# Patient Record
Sex: Male | Born: 1945 | Race: Black or African American | Hispanic: No | Marital: Married | Smoking: Former smoker
Health system: Southern US, Community
[De-identification: ages and names within clinical notes are randomized; demographics above are authoritative.]

## PROBLEM LIST (undated history)

## (undated) ENCOUNTER — Emergency Department (HOSPITAL_COMMUNITY): Admission: EM | Payer: Medicare Other

## (undated) DIAGNOSIS — Z8739 Personal history of other diseases of the musculoskeletal system and connective tissue: Secondary | ICD-10-CM

## (undated) DIAGNOSIS — Z992 Dependence on renal dialysis: Secondary | ICD-10-CM

## (undated) DIAGNOSIS — I10 Essential (primary) hypertension: Secondary | ICD-10-CM

## (undated) DIAGNOSIS — K922 Gastrointestinal hemorrhage, unspecified: Secondary | ICD-10-CM

## (undated) DIAGNOSIS — E669 Obesity, unspecified: Secondary | ICD-10-CM

## (undated) DIAGNOSIS — R079 Chest pain, unspecified: Secondary | ICD-10-CM

## (undated) DIAGNOSIS — N186 End stage renal disease: Secondary | ICD-10-CM

## (undated) DIAGNOSIS — N19 Unspecified kidney failure: Secondary | ICD-10-CM

## (undated) DIAGNOSIS — E119 Type 2 diabetes mellitus without complications: Secondary | ICD-10-CM

## (undated) DIAGNOSIS — M1990 Unspecified osteoarthritis, unspecified site: Secondary | ICD-10-CM

## (undated) DIAGNOSIS — R0602 Shortness of breath: Secondary | ICD-10-CM

## (undated) HISTORY — PX: MENISECTOMY: SHX5181

## (undated) HISTORY — PX: AV FISTULA PLACEMENT: SHX1204

## (undated) HISTORY — PX: CATARACT EXTRACTION W/ INTRAOCULAR LENS  IMPLANT, BILATERAL: SHX1307

---
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
## 1999-09-21 ENCOUNTER — Encounter: Payer: Self-pay | Admitting: Family Medicine

## 1999-09-21 ENCOUNTER — Ambulatory Visit (HOSPITAL_COMMUNITY): Admission: RE | Admit: 1999-09-21 | Discharge: 1999-09-21 | Payer: Self-pay | Admitting: Family Medicine

## 2004-07-17 ENCOUNTER — Ambulatory Visit: Payer: Self-pay | Admitting: *Deleted

## 2004-12-24 ENCOUNTER — Ambulatory Visit: Payer: Self-pay | Admitting: Nurse Practitioner

## 2005-08-13 ENCOUNTER — Ambulatory Visit: Payer: Self-pay | Admitting: Nurse Practitioner

## 2005-08-16 ENCOUNTER — Ambulatory Visit: Payer: Self-pay | Admitting: Nurse Practitioner

## 2006-02-03 ENCOUNTER — Ambulatory Visit: Payer: Self-pay | Admitting: Pulmonary Disease

## 2006-02-03 ENCOUNTER — Encounter: Payer: Self-pay | Admitting: Internal Medicine

## 2006-02-03 ENCOUNTER — Inpatient Hospital Stay (HOSPITAL_COMMUNITY): Admission: AD | Admit: 2006-02-03 | Discharge: 2006-02-10 | Payer: Self-pay | Admitting: Internal Medicine

## 2006-02-07 ENCOUNTER — Encounter: Payer: Self-pay | Admitting: Vascular Surgery

## 2006-03-01 ENCOUNTER — Ambulatory Visit: Payer: Self-pay | Admitting: Nurse Practitioner

## 2006-08-24 ENCOUNTER — Ambulatory Visit: Payer: Self-pay | Admitting: Nurse Practitioner

## 2007-05-09 ENCOUNTER — Ambulatory Visit: Payer: Self-pay | Admitting: Internal Medicine

## 2007-07-12 ENCOUNTER — Encounter (INDEPENDENT_AMBULATORY_CARE_PROVIDER_SITE_OTHER): Payer: Self-pay | Admitting: Nurse Practitioner

## 2007-07-12 ENCOUNTER — Ambulatory Visit: Payer: Self-pay | Admitting: Family Medicine

## 2007-07-12 LAB — CONVERTED CEMR LAB
Albumin: 4.4 g/dL (ref 3.5–5.2)
BUN: 89 mg/dL — ABNORMAL HIGH (ref 6–23)
Basophils Absolute: 0 10*3/uL (ref 0.0–0.1)
Basophils Relative: 0 % (ref 0–1)
CO2: 21 meq/L (ref 19–32)
Calcium: 9.6 mg/dL (ref 8.4–10.5)
Chloride: 104 meq/L (ref 96–112)
Creatinine, Ser: 8.52 mg/dL — ABNORMAL HIGH (ref 0.40–1.50)
Eosinophils Absolute: 0.3 10*3/uL (ref 0.0–0.7)
Eosinophils Relative: 4 % (ref 0–5)
Glucose, Bld: 244 mg/dL — ABNORMAL HIGH (ref 70–99)
HCT: 39.7 % (ref 39.0–52.0)
Hemoglobin: 13.2 g/dL (ref 13.0–17.0)
Lymphocytes Relative: 25 % (ref 12–46)
Lymphs Abs: 1.8 10*3/uL (ref 0.7–3.3)
MCHC: 33.2 g/dL (ref 30.0–36.0)
MCV: 100.5 fL — ABNORMAL HIGH (ref 78.0–100.0)
Monocytes Absolute: 0.4 10*3/uL (ref 0.2–0.7)
Monocytes Relative: 6 % (ref 3–11)
Neutro Abs: 4.5 10*3/uL (ref 1.7–7.7)
Neutrophils Relative %: 64 % (ref 43–77)
Phosphorus: 5.9 mg/dL — ABNORMAL HIGH (ref 2.3–4.6)
Platelets: 262 10*3/uL (ref 150–400)
Potassium: 4.2 meq/L (ref 3.5–5.3)
RBC: 3.95 M/uL — ABNORMAL LOW (ref 4.22–5.81)
RDW: 13.3 % (ref 11.5–14.0)
Sodium: 141 meq/L (ref 135–145)
WBC: 7 10*3/uL (ref 4.0–10.5)

## 2007-08-22 ENCOUNTER — Ambulatory Visit: Payer: Self-pay | Admitting: Vascular Surgery

## 2007-09-07 ENCOUNTER — Ambulatory Visit (HOSPITAL_COMMUNITY): Admission: RE | Admit: 2007-09-07 | Discharge: 2007-09-07 | Payer: Self-pay | Admitting: Vascular Surgery

## 2007-09-07 ENCOUNTER — Ambulatory Visit: Payer: Self-pay | Admitting: Vascular Surgery

## 2007-11-14 ENCOUNTER — Ambulatory Visit: Payer: Self-pay | Admitting: Vascular Surgery

## 2007-11-16 ENCOUNTER — Encounter: Payer: Self-pay | Admitting: Internal Medicine

## 2007-11-16 LAB — CONVERTED CEMR LAB
ALT: 16 units/L (ref 0–53)
AST: 20 units/L (ref 0–37)
Absolute CD4: 413 #/uL (ref 381–1469)
Albumin: 4.6 g/dL (ref 3.5–5.2)
Alkaline Phosphatase: 66 units/L (ref 39–117)
BUN: 20 mg/dL (ref 6–23)
Basophils Absolute: 0 10*3/uL (ref 0.0–0.1)
Basophils Relative: 1 % (ref 0–1)
CD4 T Helper %: 36 % (ref 32–62)
CO2: 24 meq/L (ref 19–32)
Calcium: 9.9 mg/dL (ref 8.4–10.5)
Chlamydia, Swab/Urine, PCR: NEGATIVE
Chloride: 105 meq/L (ref 96–112)
Creatinine, Ser: 1.3 mg/dL (ref 0.40–1.50)
Eosinophils Absolute: 0.1 10*3/uL (ref 0.0–0.7)
Eosinophils Relative: 2 % (ref 0–5)
GC Probe Amp, Urine: NEGATIVE
Glucose, Bld: 139 mg/dL — ABNORMAL HIGH (ref 70–99)
HCT: 42.7 % (ref 39.0–52.0)
HIV 1 RNA Quant: <50 copies/mL (ref <50)
HIV-1 RNA Quant, Log: <1.7 (ref <1.70)
Hemoglobin: 14.1 g/dL (ref 13.0–17.0)
Lymphocytes Relative: 31 % (ref 12–46)
Lymphs Abs: 1.1 10*3/uL (ref 0.7–4.0)
MCHC: 33 g/dL (ref 30.0–36.0)
MCV: 110.1 fL — ABNORMAL HIGH (ref 78.0–100.0)
Monocytes Absolute: 0.4 10*3/uL (ref 0.1–1.0)
Monocytes Relative: 12 % (ref 3–12)
Neutro Abs: 2 10*3/uL (ref 1.7–7.7)
Neutrophils Relative %: 55 % (ref 43–77)
Platelets: 224 10*3/uL (ref 150–400)
Potassium: 3.9 meq/L (ref 3.5–5.3)
RBC: 3.88 M/uL — ABNORMAL LOW (ref 4.22–5.81)
RDW: 13.3 % (ref 11.5–15.5)
Sodium: 143 meq/L (ref 135–145)
Total Bilirubin: 2 mg/dL — ABNORMAL HIGH (ref 0.3–1.2)
Total Lymphocytes %: 31 % (ref 12–46)
Total Protein: 7.5 g/dL (ref 6.0–8.3)
Total lymphocyte count: 1147 cells/mcL (ref 700–3300)
WBC, lymph enumeration: 3.7 10*3/uL — ABNORMAL LOW (ref 4.0–10.5)
WBC: 3.7 10*3/uL — ABNORMAL LOW (ref 4.0–10.5)

## 2008-11-29 ENCOUNTER — Ambulatory Visit: Payer: Self-pay | Admitting: Family Medicine

## 2009-03-03 ENCOUNTER — Ambulatory Visit: Payer: Self-pay | Admitting: Family Medicine

## 2009-03-21 ENCOUNTER — Ambulatory Visit: Payer: Self-pay | Admitting: Internal Medicine

## 2009-08-29 ENCOUNTER — Ambulatory Visit: Payer: Self-pay | Admitting: Internal Medicine

## 2010-04-30 ENCOUNTER — Ambulatory Visit: Payer: Self-pay | Admitting: Family Medicine

## 2010-04-30 LAB — CONVERTED CEMR LAB
Albumin: 4.5 g/dL (ref 3.5–5.2)
BUN: 84 mg/dL — ABNORMAL HIGH (ref 6–23)
Basophils Relative: 0 % (ref 0–1)
CO2: 22 meq/L (ref 19–32)
Chloride: 102 meq/L (ref 96–112)
Creatinine, Ser: 7.47 mg/dL — ABNORMAL HIGH (ref 0.40–1.50)
Eosinophils Absolute: 0.3 10*3/uL (ref 0.0–0.7)
Eosinophils Relative: 5 % (ref 0–5)
Glucose, Bld: 167 mg/dL — ABNORMAL HIGH (ref 70–99)
HCT: 35.7 % — ABNORMAL LOW (ref 39.0–52.0)
Lymphs Abs: 1.4 10*3/uL (ref 0.7–4.0)
MCHC: 33.6 g/dL (ref 30.0–36.0)
MCV: 101.1 fL — ABNORMAL HIGH (ref 78.0–100.0)
Monocytes Absolute: 0.5 10*3/uL (ref 0.1–1.0)
Monocytes Relative: 7 % (ref 3–12)
RBC: 3.53 M/uL — ABNORMAL LOW (ref 4.22–5.81)
Uric Acid, Serum: 9.9 mg/dL — ABNORMAL HIGH (ref 4.0–7.8)
WBC: 7 10*3/uL (ref 4.0–10.5)

## 2010-05-28 ENCOUNTER — Ambulatory Visit: Payer: Self-pay | Admitting: Internal Medicine

## 2010-05-28 LAB — CONVERTED CEMR LAB: Microalb, Ur: 44.24 mg/dL — ABNORMAL HIGH (ref 0.00–1.89)

## 2011-01-13 ENCOUNTER — Encounter: Payer: Self-pay | Admitting: Internal Medicine

## 2011-01-13 LAB — CONVERTED CEMR LAB
Albumin: 5.1 g/dL (ref 3.5–5.2)
BUN: 95 mg/dL — ABNORMAL HIGH (ref 6–23)
CO2: 22 meq/L (ref 19–32)
Calcium, Total (PTH): 9.2 mg/dL (ref 8.4–10.5)
Eosinophils Relative: 14 % — ABNORMAL HIGH (ref 0–5)
Glucose, Bld: 107 mg/dL — ABNORMAL HIGH (ref 70–99)
HCT: 36.6 % — ABNORMAL LOW (ref 39.0–52.0)
Hemoglobin: 11.9 g/dL — ABNORMAL LOW (ref 13.0–17.0)
Lymphocytes Relative: 22 % (ref 12–46)
Lymphs Abs: 2 10*3/uL (ref 0.7–4.0)
Monocytes Relative: 6 % (ref 3–12)
Platelets: 292 10*3/uL (ref 150–400)
Potassium: 4.6 meq/L (ref 3.5–5.3)
RBC: 3.68 M/uL — ABNORMAL LOW (ref 4.22–5.81)
Sodium: 144 meq/L (ref 135–145)
WBC: 8.8 10*3/uL (ref 4.0–10.5)

## 2011-03-23 NOTE — Procedures (Signed)
CEPHALIC VEIN MAPPING   INDICATION:   HISTORY:  End-stage renal disease, known left forearm occluded fistula.   EXAM:  The right cephalic vein is compressible.   Diameter measurements range from 0.22 cm at the wrist to 0.5 cm in the  upper arm.   The left cephalic vein is compressible.   Diameter measurements range from 0.49 cm at the antecubital fossa to  0.44 cm in the upper arm.   The left lower arm has an occluded fistula.   See attached worksheet for all measurements.   IMPRESSION:  Patent bilateral cephalic veins which are of acceptable  diameter for use as a dialysis access site.   ___________________________________________  Quita Skye. Hart Rochester, M.D.   DP/MEDQ  D:  08/22/2007  T:  08/23/2007  Job:  [id number]

## 2011-03-23 NOTE — Assessment & Plan Note (Signed)
OFFICE VISIT   Nicolas Mccann, Nicolas Mccann  DOB:  [birth year]                                       08/22/2007  EAVWU#:[id number]   The patient had a left forearm AV fistula created by me for end-stage  renal disease February 09, 2006 which did not remain patent and mature into  anything that could be utilized.  I saw him back in May of 2007, and at  that time felt that the next best option would be a left upper arm AV  fistula, but he did not want to proceed at that time.  He is still not  on hemodialysis.  He is now referred for a second attempt at a fistula.  We repeated his vein mapping today, and it does appear that his left  upper arm vein is adequate for a fistula.  The only unknown would be the  depth of the vein since he does have a fairly large arm.  We will plan  to proceed with creation of a left upper arm fistula on Thursday,  October 30 at [facility] as an outpatient, and hopefully this will  provide a satisfactory site for vascular access for this nice man.   Nicolas Mccann, M.D.  Electronically Signed   JDL/MEDQ  D:  08/22/2007  T:  08/23/2007  Job:  [id number]   cc:   Mindi Slicker. Lowell Guitar, M.D.

## 2011-03-23 NOTE — Op Note (Signed)
NAMEGEOFFRY, Nicolas Mccann                   ACCOUNT NO.:  [id number]   MEDICAL RECORD NO.:  [id number]          PATIENT TYPE:  AMB   LOCATION:  SDS                          FACILITY:  [facility]   PHYSICIAN:  Quita Skye. Hart Rochester, M.D.  DATE OF BIRTH:  1946-07-02   DATE OF PROCEDURE:  09/07/2007  DATE OF DISCHARGE:  09/07/2007                               OPERATIVE REPORT   PREOPERATIVE DIAGNOSIS:  End-stage renal disease.   POSTOPERATIVE DIAGNOSIS:  End-stage renal disease.   OPERATION:  Creation of a left upper arm brachial artery to cephalic  vein arteriovenous fistula (Kauffman shunt).   SURGEON:  Quita Skye. Hart Rochester, M.D.   FIRST ASSISTANT:  Nurse.   ANESTHESIA:  Local.   PROCEDURE:  The patient was taken to the operating room and placed in  the supine position, at which time the left upper extremity was prepped  with Betadine scrub and solution and draped in a routine sterile manner.  After infiltration with 1% Xylocaine with epinephrine, a transverse  incision was then made in the antecubital area, antecubital vein was  dissected free.  Cephalic branch was excellent, being at least 3.5-4 mm  in size.  The vein was mobilized, ligating its branches with 3-0 silk  ties and dividing it.  Artery was exposed beneath the fascia, encircled  with vessel loops, and no heparin was administered.  The artery was  occluded proximally and distally with vessel loops, opened with a 15  blade, extended with the Potts scissors.  There was excellent flow  present.  The vein was carefully measured and spatulated and anastomosed  end-to-side with 6-0 Prolene.  Vessel loops were then released and there  was good pulse and palpable thrill and good Doppler flow up to the  proximal upper arm over the cephalic vein.  There was also good radial  and ulnar flow distally with the fistula open.  The wound was irrigated  with saline, closed in layers with Vicryl in subcuticular fashion.  Sterile dressing applied.  The  patient taken to the recovery room in  satisfactory condition.      Quita Skye Hart Rochester, M.D.  Electronically Signed     JDL/MEDQ  D:  09/07/2007  T:  09/08/2007  Job:  [id number]

## 2011-03-23 NOTE — Assessment & Plan Note (Signed)
OFFICE VISIT   Nicolas Mccann, Nicolas Mccann  DOB:  01/02/1946                                       11/14/2007  EAVWU#:[id number]   The patient had an AV fistula created by me in the left upper arm on  October 30.  The vein was 3-1/2 to 4 mm in size and on exam there is an  excellent pulse and palpable thrill in the upper arm fistula with no  evidence of steal distally.  He does have a large arm and the vein is  easily palpable, but slightly deep.  The patient is not yet on  hemodialysis.  I think it is a nicely functioning fistula, which  hopefully will be accessible when and if he becomes ready for dialysis.  He will return to see [place] on a p.r.n. basis.   Quita Skye Hart Rochester, M.D.  Electronically Signed   JDL/MEDQ  D:  11/14/2007  T:  11/15/2007  Job:  [id number]   cc:   Mindi Slicker. Lowell Guitar, M.D.

## 2011-03-26 NOTE — Consult Note (Signed)
Nicolas Mccann, BALLING NO.:  [id number]   MEDICAL RECORD NO.:  [id number]           PATIENT TYPE:   LOCATION:                                 FACILITY:   PHYSICIAN:  Maree Krabbe, M.D.     DATE OF BIRTH:   DATE OF CONSULTATION:  DATE OF DISCHARGE:                                   CONSULTATION   REASON FOR CONSULTATION:  Elevated creatinine.   HISTORY:  Patient is a 65 year old black male with history of chronic kidney  disease followed by Dr. Lowell Guitar in the past, but has not seen him for over a  year.  He presents with a prolonged hypoglycemic event and diarrhea.  He has  a long history of diabetes, but no retinopathy.  He has a long history of  hypertension.  His creatinine is 5.6 up to 6.1 today.  His renal ultrasound  shows atrophic 6.9 cm right kidney and a 9.7 cm left kidney with thinning  and atrophy and marked echogenicity.  He is not fluid overloaded here.  Currently is on ACE inhibitor which is being held.  He has not received any  nephrotoxins here in the hospital.   PAST MEDICAL HISTORY:  1.  Hypertension.  2.  Diabetes.  3.  Chronic renal failure.  4.  Recently diagnosed hypothyroid.  5.  History of gout.  6.  History of atrial fibrillation.  7.  Chronic kidney disease.  Creatinine was 4.5 in 2000 according to the      hospitalist admission note.   MEDICATIONS ON ADMISSION:  1.  Glipizide 10 daily.  2.  Furosemide 80 daily.  3.  Colchicine 0.6 daily.  4.  Fosinopril 5 daily.  5.  Procardia 60 daily.  6.  Lantus.   ALLERGIES:  None.   FAMILY HISTORY:  Noncontributory.   SOCIAL HISTORY:  Noncontributory.  No alcohol or tobacco use.   REVIEW OF SYSTEMS:  Noncontributory.   PHYSICAL EXAMINATION:  GENERAL:  Alert and oriented, in no distress.  VITAL SIGNS:  Blood pressure 117/72, temperature 101, heart rate 80,  respirations 20.  SKIN:  Without rash.  HEENT:  PERRL.  EOMI.  Throat is clear.  NECK:  Supple without JVD.  CHEST:   Clear throughout.  CARDIAC:  Regular rate and rhythm without murmurs, rubs, or gallops.  ABDOMEN:  Obese, soft, nontender.  No organomegaly or bruits.  EXTREMITIES:  No significant ankle edema.  NEUROLOGIC:  Patient alert and oriented x3.  No asterixis.   LABORATORIES:  Sodium 140, potassium 4.1, chloride 115, CO2 16, BUN 75,  creatinine 6.1.  Hemoglobin 10.9, white blood count 11,000, platelets 216.  Chest x-ray March 29 was negative.   IMPRESSION:  1.  Chronic kidney disease with creatinine 4.5 in year 2000.  He has not      followed up with Dr. Lowell Guitar due for financial reasons.  His ultrasound      shows advanced and near end-stage renal disease.  He is not uremic at      this time and does not require dialysis,  but will require close follow      up with nephrologist after discharge.  2.  Metabolic acidosis due to diarrhea and renal failure.  3.  Hypoglycemia which is resolved.  With the advancing renal failure the      oral hypoglycemics may have a prolonged effect.   RECOMMENDATIONS:  1.  Continue bicarbonate drip over hour and then probably can discontinue.  2.  Continue to hold ACE inhibitor.  Avoid NSAIDs, dye, nephrotoxins, etc.  3.  Will follow up.  4.  I do not anticipate him having to start dialysis on this hospitalization      but we may want to consider getting a permanent access in prior to      discharge.      Maree Krabbe, M.D.  Electronically Signed     RDS/MEDQ  D:  02/05/2006  T:  02/08/2006  Job:  [id number]

## 2011-03-26 NOTE — Discharge Summary (Signed)
[name], [name] NO.:  [id number]   MEDICAL RECORD NO.:  [id number]          PATIENT TYPE:  INP   LOCATION:  5016                         FACILITY:  [facility]   PHYSICIAN:  Nicolas Rothman, MD   DATE OF BIRTH:  [birth year]   DATE OF ADMISSION:  02/03/2006  DATE OF DISCHARGE:  02/10/2006                                 DISCHARGE SUMMARY   PRIMARY CARE PHYSICIAN:  [facility].   CONSULTING PHYSICIAN:  Nephrology, Nicolas Mccann, M.D.   PRIMARY NEPHROLOGIST:  Nicolas Mccann. Nicolas Mccann, M.D.   VASCULAR SURGERY:  Nicolas Mccann, M.D.   DISCHARGE DIAGNOSES:  1.  Hypoglycemia, secondary to sulfonylurea use in the setting of worsening      renal failure.  2.  Diabetes mellitus, type 2.  3.  Advanced chronic kidney disease.  4.  Anemia.  5.  Gout flare.  6.  Hypertension.   PROCEDURES:  1.  The patient underwent creation of a left radial artery to cephalic vein      arteriovenous fistula, on April4,2007, performed by Dr. Hart Mccann.  2.  Chest x-ray, on admission, demonstrated no acute cardiopulmonary      disease.  3.  Abdominal ultrasound, on March30, 2007, demonstrated small echogenic      kidneys consistent with chronic disease, the right kidney showing more      atrophy than the left, with a length of 6.9-cm and the left his kidney      measuring 9.7-cm.  There was no definite hydronephrosis identified,      although it was difficult to penetrate the left kidney by ultrasound.      There was a small contracted gallbladder and fatty liver.   LABORATORY DATA:  On presentation, creatinine was 5.6.  On the morning of  discharge, it is 5.9.  His potassium is 4.3, calcium 7.1, phosphorus 4.4,  serum C peptide was 17.19 which is actually high.  Cardiac enzymes, on  admission, were essentially negative except for a very mildly elevated  troponin which peaked at 0.09 and mild elevated total CKs which peaked at  798, but MB fraction was relatively low.  A TSH was 1.061 which  is within  normal limits.  Iron studies revealed a total iron of 18, a total iron  binding capacity of 213, and percent saturation of 8.  A vitamin B-12 level  was 299.  RBC folate level was 427 which is within normal limits and a  ferritin level was 147.  An intact PTH was 2,089 with a calcium of 7.2.  Urine culture initially grew 50,000 colonies of group B strep, but followup  urine culture was negative.  Mccann cultures remain negative to date as are  stool cultures.   HISTORY AND PHYSICAL:  Please see dictated admission history and physical by  Dr. Lonia Mccann on [phone] for details.  But briefly, Nicolas Mccann is a 65-  year-old man with known chronic kidney disease, likely secondary to diabetes  and hypertension, who presented with three days of profuse diarrhea and  altered mental status.  He was  found to be markedly hypoglycemic on  presentation and was admitted for further management.   1.  Hypoglycemia.  This was almost certainly secondary to continued use of      glipizide in the setting of worsening renal failure and dehydration.      Obviously, all insulin and oral hypoglycemic medications were held and      he was placed on a dextrose drip, with this his sugars gradually      improved.  Ultimately, he was restarted on a moderate dose of Lantus to      control his hyperglycemia, exacerbated here in the hospital by a      prednisone taper for gout flare which will be discussed below.  However,      a few days following re-initiation of insulin therapy, he had a      hypoglycemic episode one morning.  For that reason, all insulin was      discontinued.  He was managed primarily with sliding scale insulin only,      and given only if his sugars were greater than 150.  However, with this,      the previous 24 hours prior to discharge, his sugars were consistently      in the 200s with an occasional 300.  For that reason, I think it is      reasonable to go ahead and restart his  glipizide at a much reduced dose,      namely 2.5 mg daily.  Please note, that he was previously taking 10 mg      twice daily.  I have also given him a prescription for a new Glucometer      and instructed him to check his Mccann sugars twice daily before      breakfast and before dinner and keep a record of this to take to      followup doctor appointments.  I have instructed him to hold his      glipizide if his Mccann sugars are less than 120.  Should his Mccann      sugars consistently be greater than 200 on followup with his regular      doctor next week, his glipizide can be increased very conservatively as      he is at very high risk for developing hypoglycemia, especially since he      has not yet on dialysis and his GFR is minimal at this time.  2.  Advanced chronic kidney disease.  The patient does have a known history      of chronic kidney disease previously followed by Dr. Lowell Mccann.  Nicolas Mccann      had not followed up with Dr. Lowell Mccann recently secondary to financial      issues.  Based on prior clinic notes, his creatinine was 4.5 back in      2000.  As it was felt that he was nearing the time for hemodialysis,      vascular surgery was consulted for obtaining access prior to discharge.      He underwent placement of a left arteriovenous fistula, on April4, 2007,      without complication.  He will follow up with Dr. Hart Mccann in the office.      With regard to his chronic kidney disease, he was initially placed on a      bicarbonate drip when he was admitted, and subsequently on oral citrate.      He remained on Bicitra solution which  he takes twice daily.  At this      point, he continues to have excellent urine output, greater than 2      liters daily.  His electrolytes have been within normal limits for the      previous week.  It is likely that this acute exacerbation of kidney      failure was likely secondary to profuse diarrhea.  He is instructed to     no longer take his ACE  inhibitor nor his colchicine.  He was also      instructed to hold his daily Lasix and use only as needed.  3.  Secondary hyperparathyroidism.  Lab results are as noted above.  He was      started on calcitriol daily as well as calcium for phosphate findings.  4.  Anemia.  This is likely a combination of iron deficiency anemia, but      mostly anemia of chronic disease secondary to renal failure.  He      received IV iron while here in the hospital and was also taking ferrous      sulfate.  He also received Aranesp while here in the hospital, but      secondary to financial reasons, we will not resume this upon discharge      and rather leave this to Dr. Roanna Banning discretion upon followup.  Would      recommend outpatient colonoscopy for colon cancer screening.  5.  Diarrhea.  This resolved spontaneously while he was the hospital, a      couple of days after admission.  Stool cultures remained negative to      date.  He was treated empirically with a 7-day course of ciprofloxacin      and Flagyl, although the etiology of his diarrhea may simply be      secondary to continued colchicine use in the setting of renal failure.      Again, this has since resolved.  6.  Gout flare.  Given his chronic kidney disease, this was treated with a      brief prednisone burst.  He received 5 days of a prednisone taper and      this has much improved.  It was primarily in his right hand and he felt      consistent with previous gout flares.  He was instructed to stop taking      colchicine secondary to advanced chronic kidney disease at this point.      The prednisone did cause difficulty in controlling his Mccann sugars, but      I suspect this will improve now that prednisone taper is completed.  7.  Possible Group B streptococcal urinary tract infection.  This is      somewhat of an atypical pathogen.  He was initially treated with a few      days of amoxicillin, but followup urine culture remains  negative, so      this has since been discontinued.   DISCHARGE MEDICATIONS:  The patient was instructed to stop taking  fosinopril, colchicine, and furosemide.  He may continue to use furosemide  as needed for swelling or shortness of breath.   His list of medications should be as follows:  1.  Procardia 60 mg p.o. once daily.  2.  Glipizide 2.5 mg p.o. once daily.  3.  Calcitriol 0.25 mcg p.o. once daily.  4.  Calcium carbonate 500 mg p.o. t.i.d. with meals.  5.  Ferrous sulfate  325 mg p.o. b.i.d.  6.  Bicitra 2 tablespoons p.o. twice daily after meals.  7.  Oxycodone 5 mg tablets, 1-2 tablets every 6 hours p.r.n. for pain.   DISCHARGE INSTRUCTIONS:  1.  He was instructed to check his Mccann sugars twice daily before breakfast      and before dinner and keep a record of these Mccann sugars take to his      next appointment with his primary care physician.  If his Mccann sugar is      less than 120, he was instructed not to take his glipizide. 2.  He will follow up with [facility] within the next week for further      management of his diabetes.  Again, management of his Mccann sugars, at      least for now, should be somewhat permissive of higher Mccann sugars      secondary to risk of hypoglycemia, would be very conservative in      increasing glipizide.  3.  He will follow up with Dr. Lowell Mccann in nephrology clinic on [id number] at      10:15 a.m.  4.  And will also follow up with Dr. Hart Mccann in the vascular surgery clinic      on [id number] at 10:40 a.m.  5.  He was educated on both diabetic and renal diets and understands      recommendations.      Nicolas Rothman, MD  Electronically Signed     RAR/MEDQ  D:  02/10/2006  T:  02/11/2006  Job:  [id number]   cc:   Nicolas Mccann, M.D.  Fax: [phone]   Nicolas Mccann. Nicolas Mccann, M.D.  Fax: [phone]   Nicolas Mccann, M.D.  [address]  [place]  [place] [address]

## 2011-03-26 NOTE — H&P (Signed)
NAMEJAVEON, MACMURRAY NO.:  [id number]   MEDICAL RECORD NO.:  [id number]          PATIENT TYPE:  INP   LOCATION:  2104                         FACILITY:  [facility]   PHYSICIAN:  Lonia Blood, M.D.       DATE OF BIRTH:  24-Feb-1946   DATE OF ADMISSION:  02/03/2006  DATE OF DISCHARGE:                                HISTORY & PHYSICAL   CHIEF COMPLAINT:  Diarrhea and altered mental status.   PRIMARY CARE PHYSICIAN:  [facility].   HISTORY OF PRESENT ILLNESS:  Mr. Ketchum is a 65 year old African-American man  with history of hypertension, diabetes mellitus, and chronic kidney disease  who presents to [facility] with 3 days of profuse diarrhea and  generalized weakness.  Currently he is extremely lethargic, barely  arousable, and unable to provide much history.  He denies any discomfort or  any pain right now.   PAST MEDICAL HISTORY:  1.  Diabetes mellitus.  2.  Hypertension.  3.  Gout.  4.  Chronic kidney disease with a documented creatinine of 4.5 in 2000.  5.  Also, I see a history of atrial fibrillation but the patient is      currently in sinus tachycardia.   FAMILY HISTORY AND SOCIAL HISTORY:  Unobtainable.  The patient is married  and he says that his daughter brought him to the emergency room.   MEDICATIONS AT HOME:  1.  Glipizide 10 mg daily.  2.  Furosemide 80 mg daily.  3.  Colchicine 0.6 mg daily.  4.  Fosinopril 5 mg daily.  5.  Procardia 60 mg daily.   REVIEW OF SYSTEMS:  The patient, once again, denies any discomfort.   PHYSICAL EXAMINATION UPON ADMISSION:  VITAL SIGNS:  Temperature 98.9, pulse  124, respirations 22, blood pressure 140/57, saturation 96% on room air.  CBG after 1 mg of Glucagon IM and 2 tubes of glucose is 60.  GENERAL APPEARANCE:  An obese gentleman, well-developed, well-nourished,  stuporous but arousable, lying on a stretcher.  Responds well to pain.  Uses  very few words.  HEENT:  Head appears normocephalic,  atraumatic.  Eyes show pupils equal and  round, reactive to accommodation.  Extraocular movements intact.  His  sclerae are muddy.  His mouth has multiple caries and missing teeth.  NECK:  Very thick.  There is no jugular venous distention.  CHEST:  Appears clear to auscultation bilaterally.  HEART:  Has regular rate and rhythm, tachycardic, without any murmurs, rubs,  or gallops.  ABDOMEN:  Obese, soft bowel sounds are present.  There is no rebound, no  guarding.  Eulah Pont' sign is negative.  SKIN:  Warm and dry without any suspicious rashes, very scaly on the feet.  LOWER EXTREMITIES:  Have +3 bilateral edema with very good pulses  bilaterally.   LABORATORY VALUES AT THE TIME OF ADMISSION:  White blood cell count is  12,000; hemoglobin 12.6; platelet count 246; absolute neutrophil count is  10.  Sodium 139, potassium 4, glucose is 33, chloride 113, bicarb is 16k,  BUN 76, creatinine 5.6,  total bilirubin 0.4, alkaline phosphatase 314,  albumin is 3.9, and anion gap is 10.   ASSESSMENT AND PLAN:  1.  Life-threatening hypoglycemia, most likely secondary to sulfonylurea in      the setting of worsening renal failure.  At the moment, Mr. Diemer is      without any IV access and he is getting treatment for his life-      threatening hypoglycemia with oral glucose and intramuscular Glucagon.      I have contacted the critical care physicians for central line access      since Mr. Hobbs appears to have a very difficult peripheral access.  I      do anticipate that Mr. Nied will be hypoglycemic for at least another      48 hours due to his renal failure.  2.  Acute-on-chronic renal failure.  Most likely Mr. Baccam is in worsening      renal failure secondary to dehydration from the diarrhea together with      using colchicine and furosemide and fosinopril.  For now we will push      gentle IV fluid hydration and followup the creatinine.  Overall I do not      expect much improvement in Mr.  Daughtridge creatinine.  3.  Metabolic acidosis.  Mr. Dollar currently presents with non-anion-gap      metabolic acidosis.  We do, though, need to rule out lactic acidosis but      I do suspect in the end this acidosis is related to persistent diarrhea      and renal failure.  I will start Mr. Boyajian on Scholl's solution.  4.  Elevated alkaline phosphatase.  This is unclear in origin.  I will      pursue an abdominal ultrasound to rule out cholecystitis, although Mr.      Hinks does not display any symptomatology that could explain that.  5.  Persistent diarrhea.  Mr. Zehnder has most likely an infectious colitis.      I will proceed with intravenous metronidazole, ciprofloxacin after      obtaining the stool cultures and the C. difficile toxin assay.  6.  Deep venous thrombosis prophylaxis will be done with Lovenox.      Lonia Blood, M.D.  Electronically Signed     SL/MEDQ  D:  02/03/2006  T:  02/04/2006  Job:  [id number]   cc:   Dala Dock

## 2011-03-26 NOTE — Op Note (Signed)
Nicolas Mccann, MERRIOTT NO.:  [id number]   MEDICAL RECORD NO.:  [id number]          PATIENT TYPE:  INP   LOCATION:  5016                         FACILITY:  [facility]   PHYSICIAN:  Quita Skye. Hart Rochester, M.D.  DATE OF BIRTH:  1946-05-22   DATE OF PROCEDURE:  02/09/2006  DATE OF DISCHARGE:                                 OPERATIVE REPORT   PREOPERATIVE DIAGNOSIS:  End stage renal disease.   POSTOPERATIVE DIAGNOSIS:  End stage renal disease.   OPERATION:  Creation of a left radial artery to cephalic vein arteriovenous  fistula (Cimino shunt).   SURGEON:  Quita Skye. Hart Rochester, M.D.   FIRST ASSISTANT:  Stephanie Acre Dominick, P.A.-C.   ANESTHESIA:  Local.   PROCEDURE:  The patient was taken to the operating room and placed in the  supine position at which time the left upper extremity was prepped with  Betadine scrub solution and draped in a routine sterile manner.  After  infiltration of 1% Xylocaine with epinephrine, a longitudinal incision was  made midway between the radial artery and cephalic vein just proximal to the  wrist.  Care was taken not to injure the radial nerve.  The cephalic vein  was dissected free and was noted to be thrombosed distally.  It was ligated  distally and transected and there was fresh thrombus in the vein which was a  3 to 3.5 mm vein.  A #3 Fogarty catheter would easily traverse the entire  cephalic system up into the chest.  Upon return, a short piece of fresh  thrombus was retrieved from the distal vein and heparin saline could easily  be flushed with low resistance.  Additional passes with the Fogarty yielded  no further clot and it was felt suitable for creation of a fistula.  The  radial artery was exposed, encircled with vessel loops, it was an excellent  artery being 3 to 3.5 mm in size. 3000 units of heparin was given  intravenously.  The artery was occluded proximally and distally with vessel  loops, opened with a 15 blade, extended with  Potts scissors.  The vein was  carefully measured and spatulated, anastomosed end-to-side with 6-0 Prolene.  The clamps were then released and there was a palpable pulse and thrill in  the fistula with Doppler flow at the antecubital area.  Protamine was not  given.  The wounds were irrigated with saline, closed in layers with Vicryl  in subcuticular fashion, a sterile dressing applied.  The patient taken to  recovery in satisfactory condition.           ______________________________  Quita Skye Hart Rochester, M.D.     JDL/MEDQ  D:  02/09/2006  T:  02/09/2006  Job:  [id number]

## 2011-04-29 ENCOUNTER — Ambulatory Visit: Payer: Self-pay | Admitting: *Deleted

## 2011-08-18 LAB — POCT I-STAT 4, (NA,K, GLUC, HGB,HCT)
Glucose, Bld: 160 — ABNORMAL HIGH
Operator id: 206361
Potassium: 4

## 2012-09-12 ENCOUNTER — Inpatient Hospital Stay (HOSPITAL_COMMUNITY): Payer: Medicare Other

## 2012-09-12 ENCOUNTER — Emergency Department (HOSPITAL_COMMUNITY): Payer: Medicare Other

## 2012-09-12 ENCOUNTER — Encounter (HOSPITAL_COMMUNITY): Payer: Self-pay | Admitting: Physical Medicine and Rehabilitation

## 2012-09-12 ENCOUNTER — Inpatient Hospital Stay (HOSPITAL_COMMUNITY)
Admission: EM | Admit: 2012-09-12 | Discharge: 2012-09-19 | DRG: 189 | Disposition: A | Discharge status: Home Health | Payer: Medicare Other | Attending: Internal Medicine | Admitting: Internal Medicine

## 2012-09-12 DIAGNOSIS — D72829 Elevated white blood cell count, unspecified: Secondary | ICD-10-CM

## 2012-09-12 DIAGNOSIS — Z79899 Other long term (current) drug therapy: Secondary | ICD-10-CM

## 2012-09-12 DIAGNOSIS — Z91199 Patient's noncompliance with other medical treatment and regimen due to unspecified reason: Secondary | ICD-10-CM

## 2012-09-12 DIAGNOSIS — N039 Chronic nephritic syndrome with unspecified morphologic changes: Secondary | ICD-10-CM | POA: Diagnosis present

## 2012-09-12 DIAGNOSIS — E1129 Type 2 diabetes mellitus with other diabetic kidney complication: Secondary | ICD-10-CM

## 2012-09-12 DIAGNOSIS — M109 Gout, unspecified: Secondary | ICD-10-CM | POA: Diagnosis not present

## 2012-09-12 DIAGNOSIS — N186 End stage renal disease: Secondary | ICD-10-CM

## 2012-09-12 DIAGNOSIS — N185 Chronic kidney disease, stage 5: Secondary | ICD-10-CM

## 2012-09-12 DIAGNOSIS — I498 Other specified cardiac arrhythmias: Secondary | ICD-10-CM | POA: Diagnosis not present

## 2012-09-12 DIAGNOSIS — J9601 Acute respiratory failure with hypoxia: Secondary | ICD-10-CM

## 2012-09-12 DIAGNOSIS — J9600 Acute respiratory failure, unspecified whether with hypoxia or hypercapnia: Principal | ICD-10-CM

## 2012-09-12 DIAGNOSIS — Z992 Dependence on renal dialysis: Secondary | ICD-10-CM | POA: Diagnosis present

## 2012-09-12 DIAGNOSIS — I10 Essential (primary) hypertension: Secondary | ICD-10-CM

## 2012-09-12 DIAGNOSIS — N2581 Secondary hyperparathyroidism of renal origin: Secondary | ICD-10-CM | POA: Diagnosis present

## 2012-09-12 DIAGNOSIS — I120 Hypertensive chronic kidney disease with stage 5 chronic kidney disease or end stage renal disease: Secondary | ICD-10-CM | POA: Diagnosis present

## 2012-09-12 DIAGNOSIS — M1710 Unilateral primary osteoarthritis, unspecified knee: Secondary | ICD-10-CM | POA: Diagnosis present

## 2012-09-12 DIAGNOSIS — N179 Acute kidney failure, unspecified: Secondary | ICD-10-CM | POA: Diagnosis present

## 2012-09-12 DIAGNOSIS — N19 Unspecified kidney failure: Secondary | ICD-10-CM

## 2012-09-12 DIAGNOSIS — J811 Chronic pulmonary edema: Secondary | ICD-10-CM

## 2012-09-12 DIAGNOSIS — Z9119 Patient's noncompliance with other medical treatment and regimen: Secondary | ICD-10-CM

## 2012-09-12 DIAGNOSIS — R5381 Other malaise: Secondary | ICD-10-CM | POA: Diagnosis present

## 2012-09-12 DIAGNOSIS — D631 Anemia in chronic kidney disease: Secondary | ICD-10-CM

## 2012-09-12 DIAGNOSIS — E1165 Type 2 diabetes mellitus with hyperglycemia: Secondary | ICD-10-CM | POA: Diagnosis present

## 2012-09-12 HISTORY — DX: Unspecified kidney failure: N19

## 2012-09-12 LAB — URINALYSIS, MICROSCOPIC ONLY
Bilirubin Urine: NEGATIVE
Glucose, UA: 100 mg/dL — AB
Ketones, ur: NEGATIVE mg/dL
Leukocytes, UA: NEGATIVE
Specific Gravity, Urine: 1.012 (ref 1.005–1.030)
pH: 6 (ref 5.0–8.0)

## 2012-09-12 LAB — CBC
MCH: 32.5 pg (ref 26.0–34.0)
MCHC: 32.2 g/dL (ref 30.0–36.0)
MCV: 101.2 fL — ABNORMAL HIGH (ref 78.0–100.0)
Platelets: 233 10*3/uL (ref 150–400)
RBC: 2.55 MIL/uL — ABNORMAL LOW (ref 4.22–5.81)
RDW: 15.7 % — ABNORMAL HIGH (ref 11.5–15.5)

## 2012-09-12 LAB — CREATININE, SERUM: Creatinine, Ser: 12.13 mg/dL — ABNORMAL HIGH (ref 0.50–1.35)

## 2012-09-12 LAB — RETICULOCYTES
RBC.: 2.55 MIL/uL — ABNORMAL LOW (ref 4.22–5.81)
Retic Count, Absolute: 81.6 10*3/uL (ref 19.0–186.0)

## 2012-09-12 LAB — POCT I-STAT 3, ART BLOOD GAS (G3+)
pCO2 arterial: 38.8 mmHg (ref 35.0–45.0)
pH, Arterial: 7.3 — ABNORMAL LOW (ref 7.350–7.450)
pO2, Arterial: 82 mmHg (ref 80.0–100.0)

## 2012-09-12 LAB — CBC WITH DIFFERENTIAL/PLATELET
Basophils Absolute: 0 10*3/uL (ref 0.0–0.1)
Basophils Relative: 0 % (ref 0–1)
Hemoglobin: 8.2 g/dL — ABNORMAL LOW (ref 13.0–17.0)
MCHC: 31.9 g/dL (ref 30.0–36.0)
Neutro Abs: 8.4 10*3/uL — ABNORMAL HIGH (ref 1.7–7.7)
Neutrophils Relative %: 79 % — ABNORMAL HIGH (ref 43–77)
Platelets: 226 10*3/uL (ref 150–400)
RDW: 15.6 % — ABNORMAL HIGH (ref 11.5–15.5)

## 2012-09-12 LAB — COMPREHENSIVE METABOLIC PANEL
ALT: 17 U/L (ref 0–53)
Albumin: 3.7 g/dL (ref 3.5–5.2)
Alkaline Phosphatase: 59 U/L (ref 39–117)
Calcium: 10.9 mg/dL — ABNORMAL HIGH (ref 8.4–10.5)
Potassium: 4.6 mEq/L (ref 3.5–5.1)
Sodium: 143 mEq/L (ref 135–145)
Total Protein: 8.2 g/dL (ref 6.0–8.3)

## 2012-09-12 LAB — TROPONIN I
Troponin I: 0.32 ng/mL (ref <0.30)
Troponin I: 0.57 ng/mL (ref <0.30)

## 2012-09-12 LAB — HEPATITIS B SURFACE ANTIBODY,QUALITATIVE: Hep B S Ab: NEGATIVE

## 2012-09-12 LAB — HEPATITIS B SURFACE ANTIGEN: Hepatitis B Surface Ag: NEGATIVE

## 2012-09-12 LAB — MAGNESIUM: Magnesium: 2.9 mg/dL — ABNORMAL HIGH (ref 1.5–2.5)

## 2012-09-12 LAB — PRO B NATRIURETIC PEPTIDE: Pro B Natriuretic peptide (BNP): 3528 pg/mL — ABNORMAL HIGH (ref 0–125)

## 2012-09-12 LAB — IRON AND TIBC: Saturation Ratios: 18 % — ABNORMAL LOW (ref 20–55)

## 2012-09-12 MED ORDER — LIDOCAINE-PRILOCAINE 2.5-2.5 % EX CREA: 1.0000 "application " | TOPICAL_CREAM | CUTANEOUS | Status: DC | PRN

## 2012-09-12 MED ORDER — LEVOFLOXACIN IN D5W 500 MG/100ML IV SOLN: 500.0000 mg | INTRAVENOUS | Status: DC

## 2012-09-12 MED ORDER — SODIUM CHLORIDE 0.9 % IJ SOLN
3.0000 mL | Freq: Two times a day (BID) | INTRAMUSCULAR | Status: DC
  Administered 2012-09-12 – 2012-09-18 (×12): 3 mL via INTRAVENOUS

## 2012-09-12 MED ORDER — INSULIN ASPART 100 UNIT/ML ~~LOC~~ SOLN: 0.0000 [IU] | Freq: Every day | SUBCUTANEOUS | Status: DC

## 2012-09-12 MED ORDER — INSULIN ASPART 100 UNIT/ML ~~LOC~~ SOLN
0.0000 [IU] | Freq: Three times a day (TID) | SUBCUTANEOUS | Status: DC
  Administered 2012-09-13 – 2012-09-16 (×5): 1 [IU] via SUBCUTANEOUS
  Administered 2012-09-17: 2 [IU] via SUBCUTANEOUS
  Administered 2012-09-17: 1 [IU] via SUBCUTANEOUS
  Administered 2012-09-17 – 2012-09-18 (×4): 2 [IU] via SUBCUTANEOUS
  Administered 2012-09-19: 1 [IU] via SUBCUTANEOUS
  Administered 2012-09-19: 2 [IU] via SUBCUTANEOUS

## 2012-09-12 MED ORDER — ALTEPLASE 2 MG IJ SOLR
2.0000 mg | Freq: Once | INTRAMUSCULAR | Status: AC | PRN
  Filled 2012-09-12: qty 2

## 2012-09-12 MED ORDER — NEPRO/CARBSTEADY PO LIQD
237.0000 mL | ORAL | Status: DC | PRN
  Filled 2012-09-12: qty 237

## 2012-09-12 MED ORDER — ONDANSETRON HCL 4 MG/2ML IJ SOLN: 4.0000 mg | Freq: Four times a day (QID) | INTRAMUSCULAR | Status: DC | PRN

## 2012-09-12 MED ORDER — SODIUM CHLORIDE 0.9 % IV SOLN: 100.0000 mL | INTRAVENOUS | Status: DC | PRN

## 2012-09-12 MED ORDER — CALCITRIOL 0.5 MCG PO CAPS
0.5000 ug | ORAL_CAPSULE | Freq: Every day | ORAL | Status: DC
  Administered 2012-09-13 – 2012-09-14 (×2): 0.5 ug via ORAL
  Filled 2012-09-12 (×3): qty 1

## 2012-09-12 MED ORDER — LIDOCAINE HCL (PF) 1 % IJ SOLN: 5.0000 mL | INTRAMUSCULAR | Status: DC | PRN

## 2012-09-12 MED ORDER — LEVOFLOXACIN IN D5W 750 MG/150ML IV SOLN: 750.0000 mg | Freq: Once | INTRAVENOUS | Status: DC

## 2012-09-12 MED ORDER — PENTAFLUOROPROP-TETRAFLUOROETH EX AERO: 1.0000 "application " | INHALATION_SPRAY | CUTANEOUS | Status: DC | PRN

## 2012-09-12 MED ORDER — IPRATROPIUM BROMIDE 0.02 % IN SOLN: 0.5000 mg | RESPIRATORY_TRACT | Status: DC

## 2012-09-12 MED ORDER — ACETAMINOPHEN 325 MG PO TABS
650.0000 mg | ORAL_TABLET | Freq: Four times a day (QID) | ORAL | Status: DC | PRN
  Administered 2012-09-15 (×2): 650 mg via ORAL
  Filled 2012-09-12 (×2): qty 2

## 2012-09-12 MED ORDER — ACETAMINOPHEN 650 MG RE SUPP: 650.0000 mg | Freq: Four times a day (QID) | RECTAL | Status: DC | PRN

## 2012-09-12 MED ORDER — FUROSEMIDE 10 MG/ML IJ SOLN
60.0000 mg | Freq: Once | INTRAMUSCULAR | Status: AC
  Administered 2012-09-12: 60 mg via INTRAVENOUS
  Filled 2012-09-12: qty 6

## 2012-09-12 MED ORDER — ENOXAPARIN SODIUM 30 MG/0.3ML ~~LOC~~ SOLN
30.0000 mg | SUBCUTANEOUS | Status: DC
  Administered 2012-09-12 – 2012-09-18 (×7): 30 mg via SUBCUTANEOUS
  Filled 2012-09-12 (×9): qty 0.3

## 2012-09-12 MED ORDER — FUROSEMIDE 10 MG/ML IJ SOLN
4.0000 mg/h | INTRAVENOUS | Status: DC
  Filled 2012-09-12: qty 25

## 2012-09-12 MED ORDER — HEPARIN SODIUM (PORCINE) 1000 UNIT/ML DIALYSIS
1000.0000 [IU] | INTRAMUSCULAR | Status: DC | PRN
  Filled 2012-09-12: qty 1

## 2012-09-12 MED ORDER — ALBUTEROL SULFATE (5 MG/ML) 0.5% IN NEBU: 2.5000 mg | INHALATION_SOLUTION | RESPIRATORY_TRACT | Status: DC

## 2012-09-12 MED ORDER — ONDANSETRON HCL 4 MG PO TABS: 4.0000 mg | ORAL_TABLET | Freq: Four times a day (QID) | ORAL | Status: DC | PRN

## 2012-09-12 MED ORDER — SEVELAMER CARBONATE 800 MG PO TABS
800.0000 mg | ORAL_TABLET | Freq: Three times a day (TID) | ORAL | Status: DC
  Administered 2012-09-14 – 2012-09-19 (×14): 800 mg via ORAL
  Filled 2012-09-12 (×20): qty 1

## 2012-09-12 MED ORDER — ENOXAPARIN SODIUM 40 MG/0.4ML ~~LOC~~ SOLN
40.0000 mg | SUBCUTANEOUS | Status: DC
  Filled 2012-09-12: qty 0.4

## 2012-09-12 NOTE — Progress Notes (Signed)
CRITICAL VALUE ALERT  Critical value received:  Troponin 0.32  Date of notification:  09/12/12  Time of notification:  1855  Critical value read back:yes  Nurse who received alert:  Morrie Sheldon RN  MD notified (1st page):  Frederico Hamman, MD  Time of first page:  [phone]

## 2012-09-12 NOTE — Consult Note (Signed)
Name: Nicolas Mccann MRN: [id number] DOB: 02-21-1946    LOS: 0  Referring Provider:  [facility] - Dr. Blake Divine Reason for Referral:  Respiratory distresss   PULMONARY / CRITICAL CARE MEDICINE  HPI:  66 y/o M with PMH of HTN, ESRD but not on HD presented to [facility] ED 11/5 with a 3 month history of worsening shortness of breath, orthopnea and nonproductive cough which has worsened over the past month.  Denies fevers, chills, chest pain.  Reports worsened LE swelling R>L.  Reports skipping dose of lasix 11/4.   In ER, he was noted to have saturations in 70's, tachypnea, increased work of breathing and was placed on BiPap.  CXR c/w pulmonary edema.  PCCM called for consult in setting of acute respiratory distress.    Past Medical History  Diagnosis Date  . Renal failure   . Diabetes mellitus without complication    No past surgical history on file. Prior to Admission medications   Medication Sig Start Date End Date Taking? Authorizing Provider  calcitRIOL (ROCALTROL) 0.5 MCG capsule Take 0.5 mcg by mouth daily.   Yes Historical Provider, MD  furosemide (LASIX) 80 MG tablet Take 160-240 mg by mouth 2 (two) times daily. Takes 3 tablets in the morning and 2 tablets in the evenings.   Yes Historical Provider, MD  glipiZIDE (GLUCOTROL XL) 5 MG 24 hr tablet Take 5 mg by mouth daily.   Yes Historical Provider, MD  naproxen sodium (ANAPROX) 220 MG tablet Take 220 mg by mouth 2 (two) times daily with a meal.   Yes Historical Provider, MD  NIFEdipine (ADALAT CC) 90 MG 24 hr tablet Take 90 mg by mouth daily.   Yes Historical Provider, MD  sevelamer (RENVELA) 800 MG tablet Take 800 mg by mouth 3 (three) times daily with meals.   Yes Historical Provider, MD  sodium bicarbonate 650 MG tablet Take 650 mg by mouth 2 (two) times daily.   Yes Historical Provider, MD   Allergies No Known Allergies  Family History History reviewed. No pertinent family history. Social History  reports that he has never smoked. He does not  have any smokeless tobacco history on file. He reports that he does not drink alcohol or use illicit drugs.  Review Of Systems:  All systems reviewed and are negative except positives in HPI.    Events Since Admission: 10/5 - admit with volume overload / HTN, ESRD, emergent HD  Vital Signs: Temp:  [98.3 F (36.8 C)] 98.3 F (36.8 C) (11/05 1312) Pulse Rate:  [94-100] 99  (11/05 1630) Resp:  [20-28] 28  (11/05 1630) BP: (123-189)/(70-94) 146/79 mmHg (11/05 1630) SpO2:  [78 %-100 %] 99 % (11/05 1630)  Physical Examination: General:  Morbidly obese Neuro:  AAOx4, speech clear, MAE HEENT:  Mm pink/moist,  Neck:  Thick neck, unable to assess for JVD Cardiovascular:  s1s2 rrr, no m/r/g Lungs:  resp's mildly labored, lungs bilaterally with fine crackles Abdomen:  Obese / soft, bsx4 active Musculoskeletal:  No acute deformities Skin:  Warm/dry, chronic lower extremity changes  Principal Problem:  *Acute respiratory failure Active Problems:  ESRD (end stage renal disease)  Pulmonary edema  Leukocytosis  Anemia   ASSESSMENT AND PLAN  PULMONARY  Lab 09/12/12 1657  PHART 7.300*  PCO2ART 38.8  PO2ART 82.0  HCO3 19.1*  O2SAT 95.0   Ventilator Settings:   CXR:  11/5 Enlargement of cardiac silhouette with pulmonary vascular congestion and mild perihilar infiltrates, slightly greater on left, favor edema over  infection.    A:   Acute Respiratory Distress - in setting of volume overload / ESRD / HTN ? OSA / OHS  P:   -pcxr in am -hold vq scan  -NIPPV PRN -wean O2 to keep > 90% -now dialysis -would set up for outpatient sleep eval    CARDIOVASCULAR  Lab 09/12/12 1329  TROPONINI --  LATICACIDVEN --  PROBNP 3528.0*   ECG:  sr on monitor Lines:   A: Sinus Tachycardia Hypertension - in setting of volume overload / ESRD  P:  -trend BNP -monitor BP, will defer to primary SVC -ECHO  RENAL  Lab 09/12/12 1329  NA 143  K 4.6  CL 102  CO2 18*  BUN 139*    CREATININE 12.13*  CALCIUM 10.9*  MG --  PHOS --   Intake/Output      11/04 0701 - 11/05 0700 11/05 0701 - 11/06 0700   Urine  200   Total Output  200   Net  -200         Foley:    A:   ESRD   P:   -now HD -Nephrology consult    HEMATOLOGIC  Lab 09/12/12 1329  HGB 8.2*  HCT 25.7*  PLT 226  INR --  APTT --   A:   Anemia - likely in setting of chronic disease  P:  -will defer work up to primary svc  INFECTIOUS  Lab 09/12/12 1329  WBC 10.7*  PROCALCITON --   Cultures: 11/5 BCx2>>>  Antibiotics:   A:   No indication of acute infectious process.   P:   -hold abx for now  A: DM  P: -per [facility]   Canary Brim, NP-C [facility] Pgr: [phone] or [phone]   09/12/2012, 5:30 PM    PCCM Attending: I have interviewed and examined the patient and reviewed the database. I have formulated the assessment and plan as reflected in the note above with amendments made by me.   This seems almost certainly pulmonary edema due to volume overload in setting of acute on chronic (now end-stage) renal failure. I have discontinued the order for V/Q scan as PE seems highly unlikely (given that pulmonary edema fully explains his symptoms). There is no bronchospasm so I see no reason for high dose BDs in this nonsmoker. I have stopped these. I do not elicit a history suggestive of PNA. Would favor discontinuation of abx if his CXR clears with HD. If I am wrong in expecting him to be markedly better with HD, we can reconsider alternative diagnoses. It might be worth considering daily HD for a couple of days since he is so markedly volume overloaded by exam.   Billy Fischer, MD;  PCCM service; [place] [phone]

## 2012-09-12 NOTE — H&P (Signed)
[facility] History and Physical  JOANNA HALL RUE:[id number] DOB: [birth year] DOA: 09/12/2012  Referring physician: Dr Lorenso Courier PCP: [facility] Specialists: Dr Lowell Guitar Chief Complaint: shortness of breath this morning.  HPI: Nicolas Mccann is a 66 y.o. male with prior h/o of Hypertension, ESRD not on dialysis but has arm fistula, reports being short of breath over the last three months , which has worsened over the last few weeks, associated with productive cough with sputum, no subjective fevers, orthopnea and PND present. Leg edema worsening over the last few weeks, right > left. On arrival to ED he was found to be hypoxemic with oxygen sats in 70's, he was put on 6 lit of nasal canula oxygen with oxygen sats in high 80's. He is tachypnic with increased work of breathing and was put on BIPAP. CXR showed pulmonary edema and we were called to admit patient for acute respiratory failure secondary to fluid over load from ESRD. I called pulmonary consult and renal consult to see if he can be dialyzed tonight.   Review of Systems: The patient denies anorexia, fever, weight loss,, vision loss, decreased hearing, hoarseness, chest pain, syncope,  balance deficits, hemoptysis, abdominal pain, melena, hematochezia, severe indigestion/heartburn, hematuria, incontinence, genital sores, muscle weakness, suspicious skin lesions, transient blindness, difficulty walking, depression, abnormal bleeding, enlarged lymph nodes, angioedema, and breast masses.    Past Medical History  Diagnosis Date  . Renal failure   . Diabetes mellitus without complication    No past surgical history on file. Social History:  reports that he has never smoked. He does not have any smokeless tobacco history on file. He reports that he does not drink alcohol or use illicit drugs.  where does patient live--home,  Can patient participate in ADLs? yes  No Known Allergies  History reviewed. No pertinent family history.  DM CAD in  the family.  Prior to Admission medications   Medication Sig Start Date End Date Taking? Authorizing Provider  calcitRIOL (ROCALTROL) 0.5 MCG capsule Take 0.5 mcg by mouth daily.   Yes Historical Provider, MD  furosemide (LASIX) 80 MG tablet Take 160-240 mg by mouth 2 (two) times daily. Takes 3 tablets in the morning and 2 tablets in the evenings.   Yes Historical Provider, MD  glipiZIDE (GLUCOTROL XL) 5 MG 24 hr tablet Take 5 mg by mouth daily.   Yes Historical Provider, MD  naproxen sodium (ANAPROX) 220 MG tablet Take 220 mg by mouth 2 (two) times daily with a meal.   Yes Historical Provider, MD  NIFEdipine (ADALAT CC) 90 MG 24 hr tablet Take 90 mg by mouth daily.   Yes Historical Provider, MD  sevelamer (RENVELA) 800 MG tablet Take 800 mg by mouth 3 (three) times daily with meals.   Yes Historical Provider, MD  sodium bicarbonate 650 MG tablet Take 650 mg by mouth 2 (two) times daily.   Yes Historical Provider, MD   Physical Exam: Filed Vitals:   09/12/12 1500 09/12/12 1530 09/12/12 1600 09/12/12 1630  BP: 133/94 150/72 161/76 146/79  Pulse: 98 98  99  Temp:      TempSrc:      Resp: 24 27  28   SpO2: 100% 99%  99%    Constitutional: Vital signs reviewed.  Patient is a well-developed and well-nourished  in acute distress and cooperative with exam. Alert and oriented x3.  Head: Normocephalic and atraumatic Mouth: no erythema or exudates, MMM Eyes: PERRL, EOMI, conjunctivae normal, No scleral icterus.  Neck: Supple,  Trachea midline normal ROM, No JVD, mass, thyromegaly, or carotid bruit present.  Cardiovascular: RRR, S1 normal, S2 normal, no MRG, pulses symmetric and intact bilaterally Pulmonary/Chest: bilateral rhonchi heard Abdominal: Soft. Non-tender, non-distended, bowel sounds are normal, obese and organomegaly cannot be appreciated.  Musculoskeletal:bilateral leg edema. Neurological: A&O x3, no focal deficits      Labs on Admission:  Basic Metabolic Panel:  Lab 09/12/12  1329  NA 143  K 4.6  CL 102  CO2 18*  GLUCOSE 87  BUN 139*  CREATININE 12.13*  CALCIUM 10.9*  MG --  PHOS --   Liver Function Tests:  Lab 09/12/12 1329  AST 17  ALT 17  ALKPHOS 59  BILITOT 0.3  PROT 8.2  ALBUMIN 3.7   No results found for this basename: LIPASE:5,AMYLASE:5 in the last 168 hours No results found for this basename: AMMONIA:5 in the last 168 hours CBC:  Lab 09/12/12 1329  WBC 10.7*  NEUTROABS 8.4*  HGB 8.2*  HCT 25.7*  MCV 100.8*  PLT 226   Cardiac Enzymes: No results found for this basename: CKTOTAL:5,CKMB:5,CKMBINDEX:5,TROPONINI:5 in the last 168 hours  BNP (last 3 results)  Basename 09/12/12 1329  PROBNP 3528.0*   CBG: No results found for this basename: GLUCAP:5 in the last 168 hours  Radiological Exams on Admission: Dg Chest Portable 1 View  09/12/2012  *RADIOLOGY REPORT*  Clinical Data: Shortness of breath and chest pain after exertion when catches breath, 1 month in duration, history hypertension, diabetes  PORTABLE CHEST - 1 VIEW  Comparison: Portable exam 1354 hours compared to 09/06/2005  Findings: Enlargement of cardiac silhouette with pulmonary vascular congestion. Mild perihilar infiltrates question edema, infection not excluded. No gross pleural effusion or pneumothorax. Bones unremarkable.  IMPRESSION: Enlargement of cardiac silhouette with pulmonary vascular congestion and mild perihilar infiltrates, slightly greater on left, favor edema over infection.   Original Report Authenticated By: Ulyses Southward, M.D.     EKG:low voltage NSR  Assessment/Plan    1. Acute respiratory failure with hypoxemia secondary to pulmonary edema from fluid overload from ESRD. - Admit to step down - BIPAP as needed - lasix infusion started - ABG ordered - Pulmonary consult called for further recommendations.  - Renal consult called from Dr Hyman Hopes to see if he can be dialyzed tonight.  - nebs as needed.  - check probnp in am. - 2D echocardiogram. -  serial cardiac enzymes. - low probability for PE as this has been going on for a few months. But will get a V/Q scan to evaluate further.   2. Hypertension; controlled .  3. Uncontrolled DM: HGBA1C,  - SSI.  4. ANEMIA: macrocytic. Anemia panel sent.   5. Leukocytosis: mild. CXR show mild perihilar infiltrates favor edema rather than infection. He is afebrile on admission.  Will treat  With levaquin for 5 days.   6. DVT prophylaxis  Code Status: full code Family Communication: family at bedside Disposition Plan:  atleast 2 midnights.    [facility] Pager [phone]  If 7PM-7AM, please contact night-coverage [email or web address] Password [facility] 09/12/2012, 4:57 PM

## 2012-09-12 NOTE — Consult Note (Signed)
Referring Provider: No ref. provider found Primary Care Physician:  Sheila Oats, MD Primary Nephrologist:  Dr. Lowell Guitar  Reason for Consultation: Chronic Renal disease Stage 5 . 1 Month history of progressive dyspnea and stubborn lower extremity edema   HPI: The current episode started more than 2 weeks ago. The onset was gradual. The problem occurs continuously. The problem has been gradually worsening. The problem is severe. Nothing relieves the symptoms. Nothing aggravates the symptoms. Associated symptoms include cough (dry, months) and shortness of breath. Pertinent negatives include no chest pain, no chest pressure, no sore throat and no wheezing.Known advanced renal disease with AVF placed 2 years ago. Orthopnea 3 pillow and paroxysmal nocturnal dyspnea. [facility]. Poor appetite.   Past Medical History  Diagnosis Date  . Renal failure   . Diabetes mellitus without complication     No past surgical history on file.  Prior to Admission medications   Medication Sig Start Date End Date Taking? Authorizing Provider  calcitRIOL (ROCALTROL) 0.5 MCG capsule Take 0.5 mcg by mouth daily.   Yes Historical Provider, MD  furosemide (LASIX) 80 MG tablet Take 160-240 mg by mouth 2 (two) times daily. Takes 3 tablets in the morning and 2 tablets in the evenings.   Yes Historical Provider, MD  glipiZIDE (GLUCOTROL XL) 5 MG 24 hr tablet Take 5 mg by mouth daily.   Yes Historical Provider, MD  naproxen sodium (ANAPROX) 220 MG tablet Take 220 mg by mouth 2 (two) times daily with a meal.   Yes Historical Provider, MD  NIFEdipine (ADALAT CC) 90 MG 24 hr tablet Take 90 mg by mouth daily.   Yes Historical Provider, MD  sevelamer (RENVELA) 800 MG tablet Take 800 mg by mouth 3 (three) times daily with meals.   Yes Historical Provider, MD  sodium bicarbonate 650 MG tablet Take 650 mg by mouth 2 (two) times daily.   Yes Historical Provider, MD    Current Facility-Administered  Medications  Medication Dose Route Frequency Provider Last Rate Last Dose  . 0.9 %  sodium chloride infusion  100 mL Intravenous PRN Garnetta Buddy, MD      . 0.9 %  sodium chloride infusion  100 mL Intravenous PRN Garnetta Buddy, MD      . alteplase (CATHFLO ACTIVASE) injection 2 mg  2 mg Intracatheter Once PRN Garnetta Buddy, MD      . feeding supplement (NEPRO CARB STEADY) liquid 237 mL  237 mL Oral PRN Garnetta Buddy, MD      . [COMPLETED] furosemide (LASIX) injection 60 mg  60 mg Intravenous Once Tobin Chad, MD   60 mg at 09/12/12 1500  . heparin injection 1,000 Units  1,000 Units Dialysis PRN Garnetta Buddy, MD      . levofloxacin [facility]) IVPB 750 mg  750 mg Intravenous Once [employer], PHARMD      . lidocaine (XYLOCAINE) 1 % injection 5 mL  5 mL Intradermal PRN Garnetta Buddy, MD      . lidocaine-prilocaine (EMLA) cream 1 application  1 application Topical PRN Garnetta Buddy, MD      . pentafluoroprop-tetrafluoroeth Peggye Pitt) aerosol 1 application  1 application Topical PRN Garnetta Buddy, MD       Current Outpatient Prescriptions  Medication Sig Dispense Refill  . calcitRIOL (ROCALTROL) 0.5 MCG capsule Take 0.5 mcg by mouth daily.      . furosemide (LASIX) 80 MG tablet Take 160-240 mg by mouth 2 (  two) times daily. Takes 3 tablets in the morning and 2 tablets in the evenings.      Marland Kitchen glipiZIDE (GLUCOTROL XL) 5 MG 24 hr tablet Take 5 mg by mouth daily.      . naproxen sodium (ANAPROX) 220 MG tablet Take 220 mg by mouth 2 (two) times daily with a meal.      . NIFEdipine (ADALAT CC) 90 MG 24 hr tablet Take 90 mg by mouth daily.      . sevelamer (RENVELA) 800 MG tablet Take 800 mg by mouth 3 (three) times daily with meals.      . sodium bicarbonate 650 MG tablet Take 650 mg by mouth 2 (two) times daily.        Allergies as of 09/12/2012  . (No Known Allergies)    History reviewed. No pertinent family history.  History   Social History  . Marital Status: Married     Spouse Name: N/A    Number of Children: N/A  . Years of Education: N/A   Occupational History  . Not on file.   Social History Main Topics  . Smoking status: Never Smoker   . Smokeless tobacco: Not on file  . Alcohol Use: No  . Drug Use: No  . Sexually Active:    Other Topics Concern  . Not on file   Social History Narrative  . No narrative on file    Review of Systems: Gen: Denies any fever, chills, sweats, anorexia, fatigue, weakness, malaise, weight loss, and sleep disorder HEENT: No visual complaints, No history of Retinopathy. Normal external appearance No Epistaxis or Sore throat. No sinusitis.   ZO:[id number] 4 pillow and dyspnea at rest Resp: No pleuritic chest pain or sputum or fever but cough x 4-6 weeks GI: Denies vomiting blood, jaundice, and fecal incontinence.   Denies dysphagia or odynophagia. GU : Denies urinary burning, blood in urine, urinary frequency, urinary hesitancy, nocturnal urination, and urinary incontinence.  No renal calculi. MS: Denies joint pain, limitation of movement, and swelling, stiffness, low back pain, extremity pain. Denies muscle weakness, cramps, atrophy.  No use of non steroidal antiinflammatory drugs. Derm: Dry Skin  Psych: Denies depression, anxiety, memory loss, suicidal ideation, hallucinations, paranoia, and confusion. Heme: Denies bruising, bleeding, and enlarged lymph nodes. Neuro: No headache.  No diplopia. No dysarthria.  No dysphasia.  No history of CVA.  No Seizures. No paresthesias.  No weakness. Endocrine DM.  No Thyroid disease.  No Adrenal disease.  Physical Exam: Vital signs in last 24 hours: Temp:  [98.3 F (36.8 C)] 98.3 F (36.8 C) (11/05 1312) Pulse Rate:  [94-100] 99  (11/05 1630) Resp:  [20-28] 28  (11/05 1630) BP: (123-189)/(70-94) 146/79 mmHg (11/05 1630) SpO2:  [78 %-100 %] 99 % (11/05 1630)   General:   Chronically Ill appearing Head:  Normocephalic and atraumatic. Eyes:  Sclera clear, no icterus.    Conjunctiva pink. Ears:  Normal auditory acuity. Nose:  No deformity, discharge,  or lesions. Mouth:  No deformity or lesions, dentition normal. Neck:  Supple; no masses or thyromegaly. JVP  elevated Lungs:  Diminished with poor air entry and rales Heart:  Regular rate and rhythm; no murmurs, clicks, rubs,  or gallops. Abdomen: Distended but active bowel sounds Msk:  Symmetrical without gross deformities. Normal posture. Pulses:  No carotid, renal, femoral bruits. DP and PT symmetrical and equal Extremities: 4 +edema. pitting Neurologic:  Appropriate but in respiratory distress Skin:  Tight  Cervical Nodes:  No significant  cervical adenopathy. Psych:  Alert and cooperative. Normal mood and affect.  Intake/Output from previous day:   Intake/Output this shift: Total I/O In: -  Out: 200 [Urine:200]  Lab Results:  Basename 09/12/12 1329  WBC 10.7*  HGB 8.2*  HCT 25.7*  PLT 226   BMET  Basename 09/12/12 1329  NA 143  K 4.6  CL 102  CO2 18*  GLUCOSE 87  BUN 139*  CREATININE 12.13*  CALCIUM 10.9*  PHOS --   LFT  Basename 09/12/12 1329  PROT 8.2  ALBUMIN 3.7  AST 17  ALT 17  ALKPHOS 59  BILITOT 0.3  BILIDIR --  IBILI --   PT/INR No results found for this basename: LABPROT:2,INR:2 in the last 72 hours Hepatitis Panel No results found for this basename: HEPBSAG,HCVAB,HEPAIGM,HEPBIGM in the last 72 hours  Studies/Results: Dg Chest Portable 1 View  09/12/2012  *RADIOLOGY REPORT*  Clinical Data: Shortness of breath and chest pain after exertion when catches breath, 1 month in duration, history hypertension, diabetes  PORTABLE CHEST - 1 VIEW  Comparison: Portable exam 1354 hours compared to 09/06/2005  Findings: Enlargement of cardiac silhouette with pulmonary vascular congestion. Mild perihilar infiltrates question edema, infection not excluded. No gross pleural effusion or pneumothorax. Bones unremarkable.  IMPRESSION: Enlargement of cardiac silhouette with  pulmonary vascular congestion and mild perihilar infiltrates, slightly greater on left, favor edema over infection.   Original Report Authenticated By: Ulyses Southward, M.D.     Assessment/Plan:  Advanced CKD 5. Plan dialysis tonight and tomorrow  Anemia check iron stores and start aranesp  Bones continue binders and vitamin D  Access Use mature AVF  Nutrition Renal vitamins daily  Volume massive anasarca will need dialysis   LOS: 0 Trecia Maring W @TODAY @5 :03 PM

## 2012-09-12 NOTE — Progress Notes (Signed)
Patient set up on BiPAP at this time, 12/6 & 30%. He seems to be tolerating well. BBS diminshed but coarse. SAT 98%. RT will continue to monitor.

## 2012-09-12 NOTE — Consult Note (Signed)
I have evaluated pt and placed orders. Full consult note to follow. This is acute resp failure due to pulmonary edema and severe volume overload in the setting of acute on chronic renal failure. No need to evaluate for PE. He is not wheezing. Therefore I have stopped BDs  Billy Fischer, MD ; [facility] [phone].  After 5:30 PM or weekends, call [phone]

## 2012-09-12 NOTE — Progress Notes (Signed)
LUA fistula stuck for the first time. Arterial needle had blood return with ease but as Tx was started AP pressures were high, needle repositioned and attempted to run again followed by the arterial pressures continuing to climb. Tx interrupted and a large clot was pulled from the arterial needle followed by the pt clotting the system off that had just been started on him. (system had only filled about a quarter full of blood. Arterial needle was pulled and graft was restuck with 17G needles above the existing needle, system was changed and treatment was restarted.

## 2012-09-12 NOTE — ED Provider Notes (Signed)
History     CSN: [id number]  Arrival date & time 09/12/12  1224   First MD Initiated Contact with Patient 09/12/12 1316      Chief Complaint  Patient presents with  . Shortness of Breath    (Consider location/radiation/quality/duration/timing/severity/associated sxs/prior treatment) HPI Comments: The patient states he has been short of breath for one month and it has actually become worse. His wife states that he has appeared significantly short of breath for the past several days. He denies any pain, fever, chills. He does have a dry cough.  Patient is a 66 y.o. male presenting with shortness of breath. The history is provided by the patient and the spouse. No language interpreter was used.  Shortness of Breath  The current episode started more than 2 weeks ago. The onset was gradual. The problem occurs continuously. The problem has been gradually worsening. The problem is severe. Nothing relieves the symptoms. Nothing aggravates the symptoms. Associated symptoms include cough (dry, months) and shortness of breath. Pertinent negatives include no chest pain, no chest pressure, no sore throat and no wheezing. There was no intake of a foreign body. The Heimlich maneuver was not attempted. He has had no prior steroid use. He has had prior hospitalizations. He has had no prior ICU admissions. His past medical history does not include asthma, past wheezing or eczema. Urine output has been normal. The last void occurred less than 6 hours ago. There were no sick contacts. He has received no recent medical care.    Past Medical History  Diagnosis Date  . Renal failure   . Diabetes mellitus without complication     No past surgical history on file.  History reviewed. No pertinent family history.  History  Substance Use Topics  . Smoking status: Never Smoker   . Smokeless tobacco: Not on file  . Alcohol Use: No      Review of Systems  Constitutional: Negative for diaphoresis, activity  change and appetite change.  HENT: Negative for sore throat and neck pain.   Eyes: Negative for discharge and visual disturbance.  Respiratory: Positive for cough (dry, months) and shortness of breath. Negative for choking and wheezing.   Cardiovascular: Negative for chest pain and leg swelling.  Gastrointestinal: Negative for nausea, vomiting, abdominal pain, diarrhea and constipation.  Genitourinary: Negative for dysuria and difficulty urinating.  Musculoskeletal: Negative for back pain and arthralgias.  Skin: Negative for color change and pallor.  Neurological: Negative for dizziness, speech difficulty and light-headedness.  Psychiatric/Behavioral: Negative for behavioral problems and agitation.  All other systems reviewed and are negative.    Allergies  Review of patient's allergies indicates no known allergies.  Home Medications   Current Outpatient Rx  Name  Route  Sig  Dispense  Refill  . CALCITRIOL 0.5 MCG PO CAPS   Oral   Take 0.5 mcg by mouth daily.         . FUROSEMIDE 80 MG PO TABS   Oral   Take 160-240 mg by mouth 2 (two) times daily. Takes 3 tablets in the morning and 2 tablets in the evenings.         Marland Kitchen GLIPIZIDE ER 5 MG PO TB24   Oral   Take 5 mg by mouth daily.         Marland Kitchen NAPROXEN SODIUM 220 MG PO TABS   Oral   Take 220 mg by mouth 2 (two) times daily with a meal.         .  NIFEDIPINE ER 90 MG PO TB24   Oral   Take 90 mg by mouth daily.         Marland Kitchen SEVELAMER CARBONATE 800 MG PO TABS   Oral   Take 800 mg by mouth 3 (three) times daily with meals.         . SODIUM BICARBONATE 650 MG PO TABS   Oral   Take 650 mg by mouth 2 (two) times daily.           BP 152/87  Pulse 95  Temp 98.3 F (36.8 C) (Oral)  Resp 21  SpO2 90%  Physical Exam  Constitutional: He appears well-developed. No distress.  HENT:  Head: Normocephalic and atraumatic.  Mouth/Throat: No oropharyngeal exudate.  Eyes: EOM are normal. Pupils are equal, round, and  reactive to light. Right eye exhibits no discharge. Left eye exhibits no discharge.  Neck: Normal range of motion. Neck supple. No JVD present.  Cardiovascular: Normal rate, regular rhythm and normal heart sounds.   Pulmonary/Chest: Effort normal. No stridor. No respiratory distress. He has no wheezes. He has rales (R base). He exhibits no tenderness.  Abdominal: Soft. Bowel sounds are normal. There is no tenderness. There is no guarding.  Genitourinary: Penis normal.  Musculoskeletal: Normal range of motion. He exhibits edema (2+ BLE, chronic and unchanged). He exhibits no tenderness.  Neurological: He is alert. No cranial nerve deficit. He exhibits normal muscle tone.  Skin: Skin is warm and dry. He is not diaphoretic. No erythema. No pallor.  Psychiatric: He has a normal mood and affect. His behavior is normal. Judgment and thought content normal.    ED Course  Procedures (including critical care time)  Labs Reviewed  CBC WITH DIFFERENTIAL - Abnormal; Notable for the following:    WBC 10.7 (*)     RBC 2.55 (*)     Hemoglobin 8.2 (*)     HCT 25.7 (*)     MCV 100.8 (*)     RDW 15.6 (*)     Neutrophils Relative 79 (*)     Neutro Abs 8.4 (*)     Lymphocytes Relative 9 (*)     Monocytes Absolute 1.1 (*)     All other components within normal limits  PRO B NATRIURETIC PEPTIDE - Abnormal; Notable for the following:    Pro B Natriuretic peptide (BNP) 3528.0 (*)     All other components within normal limits  COMPREHENSIVE METABOLIC PANEL - Abnormal; Notable for the following:    CO2 18 (*)     BUN 139 (*)     Creatinine, Ser 12.13 (*)     Calcium 10.9 (*)     GFR calc non Af Amer 4 (*)     GFR calc Af Amer 4 (*)     All other components within normal limits  URINALYSIS, MICROSCOPIC ONLY   Dg Chest Portable 1 View  09/12/2012  *RADIOLOGY REPORT*  Clinical Data: Shortness of breath and chest pain after exertion when catches breath, 1 month in duration, history hypertension, diabetes   PORTABLE CHEST - 1 VIEW  Comparison: Portable exam 1354 hours compared to 09/06/2005  Findings: Enlargement of cardiac silhouette with pulmonary vascular congestion. Mild perihilar infiltrates question edema, infection not excluded. No gross pleural effusion or pneumothorax. Bones unremarkable.  IMPRESSION: Enlargement of cardiac silhouette with pulmonary vascular congestion and mild perihilar infiltrates, slightly greater on left, favor edema over infection.   Original Report Authenticated By: Ulyses Southward, M.D.  1. Kidney failure   2. Pulmonary edema      Date: 09/12/2012  Rate: 96  Rhythm: normal sinus rhythm  QRS Axis: left  Intervals: normal  ST/T Wave abnormalities: normal  Conduction Disutrbances: low voltage  Narrative Interpretation: low voltage likely 2/2 habitus  Old EKG Reviewed: No significant changes noted     MDM  2:11 PM patient to be hypoxic in the 70s in triage so he was promptly  brought back to the trauma bay to be evaluated. When I saw him he was saturating in the upper 90s on a nonrebreather. He was weaned to 5 L nasal cannula and was saturating from 88-90%. He denies any pain. EKG unremarkable for acute infarction or ischemia. Does have a history of kidney failure so fluid retention may be the cause of this. Will also check a chest x-ray and a B. natruretic peptide to further evaluate. Stable at this time. Considered but doubt PE based on timing of symptoms.  3:14 PM will admit to hospitalist step down, spoke w/ nephrology who will also eval for urgent HD.  Admitted in stable condition   Warrick Parisian, MD 09/12/12 1529

## 2012-09-12 NOTE — Progress Notes (Signed)
ANTIBIOTIC CONSULT NOTE - INITIAL  Pharmacy Consult for levaquin Indication: possible pneumonia  No Known Allergies  Patient Measurements:    Vital Signs: Temp: 98.3 F (36.8 C) (11/05 1312) Temp src: Oral (11/05 1312) BP: 146/79 mmHg (11/05 1630) Pulse Rate: 99  (11/05 1630) Intake/Output from previous day:   Intake/Output from this shift: Total I/O In: -  Out: 200 [Urine:200]  Labs:  Basename 09/12/12 1329  WBC 10.7*  HGB 8.2*  PLT 226  LABCREA --  CREATININE 12.13*   CrCl is unknown because there is no height on file for the current visit. No results found for this basename: VANCOTROUGH:2,VANCOPEAK:2,VANCORANDOM:2,GENTTROUGH:2,GENTPEAK:2,GENTRANDOM:2,TOBRATROUGH:2,TOBRAPEAK:2,TOBRARND:2,AMIKACINPEAK:2,AMIKACINTROU:2,AMIKACIN:2, in the last 72 hours   Microbiology: No results found for this or any previous visit (from the past 720 hour(s)).  Medical History: Past Medical History  Diagnosis Date  . Renal failure   . Diabetes mellitus without complication     Medications:  Calcitrol, lasix, glipizide, naproxen, nifedipine, sevelamer, sodium bicarb  Assessment: 10 yof presented to the ED with SOB x 1 month. He has also had a productive cough. He is currently afebrile and WBC is 10.7. His Scr is 12.13 but pt is not on chronic dialysis. However, nephrology is hear to evaluate for possible emergent HD.   Goal of Therapy:  Eradication of infection  Plan:  1. Levaquin 750mg  IV x 1 then 500mg  IV Q48H 2. F/u renal fxn, C&S, clinical status  3. F/u renal plans  Rogerio Boutelle, Drake Leach 09/12/2012,4:53 PM

## 2012-09-12 NOTE — ED Notes (Signed)
Pt c/o of worsening SOB and feeling tired. Dr. Ambrose Mantle made aware and new orders for BIpap received.

## 2012-09-12 NOTE — Progress Notes (Addendum)
Pt arrived to unit on BIPAP via stretcher at 1715.  Dr. Sung Amabile in to see patient at 97.  After assessing pt, MD put NRB mask.  Ok for pt to be on NRB unless respiratory status decreases per MD (BIPAP PRN).  Also, pt to HD asap.  Pt transferred to HD via HD RN at 1805.  Focused assessment of pt completed, VS completed, CHG bath and gown change completed.  MRSA swab sent to lab. Pt ID band placed on wrist.     Will report off to night RN about patient.  Salomon Mast, RN

## 2012-09-12 NOTE — ED Notes (Signed)
Pt presents to department for evaluation of SOB. Ongoing x1 month. Pt also states swelling to bilateral lower extremities. Denies chest pain at the time. Becomes short of breath on exertion, states he is unable to walk long distances. 75% RA upon arrival. Speaking short phrases. He is conscious alert and oriented x4.

## 2012-09-13 ENCOUNTER — Inpatient Hospital Stay (HOSPITAL_COMMUNITY): Payer: Medicare Other

## 2012-09-13 DIAGNOSIS — N039 Chronic nephritic syndrome with unspecified morphologic changes: Secondary | ICD-10-CM

## 2012-09-13 DIAGNOSIS — E1129 Type 2 diabetes mellitus with other diabetic kidney complication: Secondary | ICD-10-CM | POA: Diagnosis present

## 2012-09-13 DIAGNOSIS — N189 Chronic kidney disease, unspecified: Secondary | ICD-10-CM

## 2012-09-13 DIAGNOSIS — N19 Unspecified kidney failure: Secondary | ICD-10-CM

## 2012-09-13 DIAGNOSIS — I10 Essential (primary) hypertension: Secondary | ICD-10-CM | POA: Diagnosis present

## 2012-09-13 LAB — GLUCOSE, CAPILLARY
Glucose-Capillary: 70 mg/dL (ref 70–99)
Glucose-Capillary: 95 mg/dL (ref 70–99)
Glucose-Capillary: 132 mg/dL — ABNORMAL HIGH (ref 70–99)
Glucose-Capillary: 87 mg/dL (ref 70–99)

## 2012-09-13 LAB — CBC
HCT: 22 % — ABNORMAL LOW (ref 39.0–52.0)
Hemoglobin: 7 g/dL — ABNORMAL LOW (ref 13.0–17.0)
MCH: 32.4 pg (ref 26.0–34.0)
MCHC: 32.1 g/dL (ref 30.0–36.0)
Platelets: 183 10*3/uL (ref 150–400)
Platelets: 189 10*3/uL (ref 150–400)
RBC: 2.16 MIL/uL — ABNORMAL LOW (ref 4.22–5.81)
RDW: 15.4 % (ref 11.5–15.5)
RDW: 15.5 % (ref 11.5–15.5)

## 2012-09-13 LAB — RENAL FUNCTION PANEL
Albumin: 3.1 g/dL — ABNORMAL LOW (ref 3.5–5.2)
Calcium: 9.7 mg/dL (ref 8.4–10.5)
GFR calc Af Amer: 5 mL/min — ABNORMAL LOW (ref >90)
Phosphorus: 7.9 mg/dL — ABNORMAL HIGH (ref 2.3–4.6)
Potassium: 4.6 mEq/L (ref 3.5–5.1)
Sodium: 145 mEq/L (ref 135–145)

## 2012-09-13 LAB — BASIC METABOLIC PANEL
BUN: 116 mg/dL — ABNORMAL HIGH (ref 6–23)
Chloride: 106 mEq/L (ref 96–112)
GFR calc Af Amer: 5 mL/min — ABNORMAL LOW (ref >90)
Potassium: 4.5 mEq/L (ref 3.5–5.1)
Sodium: 143 mEq/L (ref 135–145)

## 2012-09-13 LAB — VITAMIN B12: Vitamin B-12: 363 pg/mL (ref 211–911)

## 2012-09-13 LAB — IRON AND TIBC
Iron: 44 ug/dL (ref 42–135)
Saturation Ratios: 17 % — ABNORMAL LOW (ref 20–55)
TIBC: 263 ug/dL (ref 215–435)
UIBC: 219 ug/dL (ref 125–400)

## 2012-09-13 LAB — HEMOGLOBIN A1C
Hgb A1c MFr Bld: 5.9 % — ABNORMAL HIGH (ref <5.7)
Mean Plasma Glucose: 123 mg/dL — ABNORMAL HIGH (ref <117)

## 2012-09-13 NOTE — Progress Notes (Signed)
Pt had 7 beat run of V-Tach. Pt nonsymptomatic. Awake in room. No complaints. VS stable. Dr. Marin Shutter made aware. Orders received. Will continue to monitor.

## 2012-09-13 NOTE — Progress Notes (Signed)
eLink Physician-Brief Progress Note Patient Name: Nicolas Mccann DOB: 1946/04/09 MRN: [id number]  Date of Service  09/13/2012   HPI/Events of Note  RN says > 2.5L removed by HD but there is an order by [facility] for lasix drip and she wants to know if she is to start lasix gtt. Lasix gtt order written by [facility] hospitalist per rN   eICU Interventions  Instructed RN to clarify with [facility] hospitalist   Intervention Category Intermediate Interventions: Other:  Letia Guidry 09/13/2012, 5:25 AM

## 2012-09-13 NOTE — Progress Notes (Signed)
[facility] ROUNDING NOTE   Subjective:   Interval History:still dyspneic  Objective:  Vital signs in last 24 hours:  Temp:  [97.9 F (36.6 C)-98.3 F (36.8 C)] 98.2 F (36.8 C) (11/06 0700) Pulse Rate:  [80-101] 82  (11/06 0700) Resp:  [16-33] 28  (11/06 0700) BP: (100-189)/(56-97) 117/65 mmHg (11/06 0700) SpO2:  [78 %-100 %] 96 % (11/06 0500) FiO2 (%):  [35 %-100 %] 40 % (11/06 0654) Weight:  [141.7 kg (312 lb 6.3 oz)-141.8 kg (312 lb 9.8 oz)] 141.8 kg (312 lb 9.8 oz) (11/06 0500)  Weight change:  Filed Weights   09/12/12 2119 09/12/12 2300 09/13/12 0500  Weight: 141.7 kg (312 lb 6.3 oz) 141.7 kg (312 lb 6.3 oz) 141.8 kg (312 lb 9.8 oz)    Intake/Output: I/O last 3 completed shifts: In: 360 [P.O.:360] Out: 4176 [Urine:1300; Other:2876]   Intake/Output this shift:  Total I/O In: -  Out: 500 [Urine:500]  CVS- RRR RS- diminidshed air entry ABD- BS present soft non-distended EXT- 4 + edema   Basic Metabolic Panel:  Lab 09/13/12 1610 09/13/12 0325 09/12/12 1754 09/12/12 1329  NA -- 143 -- 143  K -- 4.5 -- 4.6  CL -- 106 -- 102  CO2 -- 22 -- 18*  GLUCOSE -- 120* -- 87  BUN -- 116* -- 139*  CREATININE -- 10.55* 12.13* 12.13*  CALCIUM -- 9.8 -- 10.9*  MG 2.6* -- 2.9* --  PHOS -- -- 9.2* --    Liver Function Tests:  Lab 09/12/12 1329  AST 17  ALT 17  ALKPHOS 59  BILITOT 0.3  PROT 8.2  ALBUMIN 3.7   No results found for this basename: LIPASE:5,AMYLASE:5 in the last 168 hours No results found for this basename: AMMONIA:3 in the last 168 hours  CBC:  Lab 09/13/12 0325 09/12/12 1754 09/12/12 1329  WBC 9.8 11.1* 10.7*  NEUTROABS -- -- 8.4*  HGB 7.0* 8.3* 8.2*  HCT 22.0* 25.8* 25.7*  MCV 101.9* 101.2* 100.8*  PLT 183 233 226    Cardiac Enzymes:  Lab 09/13/12 0605 09/12/12 2254 09/12/12 1755  CKTOTAL -- -- --  CKMB -- -- --  CKMBINDEX -- -- --  TROPONINI 0.42* 0.57* 0.32*    BNP: No components found with this basename:  POCBNP:5  CBG:  Lab 09/13/12 0840 09/12/12 2248  GLUCAP 95 70    Microbiology: Results for orders placed during the hospital encounter of 09/12/12  MRSA PCR SCREENING     Status: Normal   Collection Time   09/12/12  6:50 PM      Component Value Range Status Comment   MRSA by PCR NEGATIVE  NEGATIVE Final     Coagulation Studies: No results found for this basename: LABPROT:5,INR:5 in the last 72 hours  Urinalysis:  Basename 09/12/12 1504  COLORURINE YELLOW  LABSPEC 1.012  PHURINE 6.0  GLUCOSEU 100*  HGBUR LARGE*  BILIRUBINUR NEGATIVE  KETONESUR NEGATIVE  PROTEINUR 100*  UROBILINOGEN 0.2  NITRITE NEGATIVE  LEUKOCYTESUR NEGATIVE      Imaging: Dg Chest Port 1 View  09/13/2012  *RADIOLOGY REPORT*  Clinical Data: Pulmonary edema follow-up  PORTABLE CHEST - 1 VIEW  Comparison: 09/12/2012  Findings: Prominent cardiomediastinal contours.  Central vascular congestion.  Central peribronchial thickening, interstitial, and left greater right perihilar/infrahilar airspace opacities.  No definite pleural effusion.  No pneumothorax.  No interval osseous change.  IMPRESSION: Prominent cardiomediastinal contours with pulmonary edema pattern, similar to prior.   Original Report Authenticated By: Greig Castilla  DelGaizo, M.D.    Dg Chest Portable 1 View  09/12/2012  *RADIOLOGY REPORT*  Clinical Data: Shortness of breath and chest pain after exertion when catches breath, 1 month in duration, history hypertension, diabetes  PORTABLE CHEST - 1 VIEW  Comparison: Portable exam 1354 hours compared to 09/06/2005  Findings: Enlargement of cardiac silhouette with pulmonary vascular congestion. Mild perihilar infiltrates question edema, infection not excluded. No gross pleural effusion or pneumothorax. Bones unremarkable.  IMPRESSION: Enlargement of cardiac silhouette with pulmonary vascular congestion and mild perihilar infiltrates, slightly greater on left, favor edema over infection.   Original Report  Authenticated By: Ulyses Southward, M.D.      Medications:      . [DISCONTINUED] furosemide (LASIX) infusion        . calcitRIOL  0.5 mcg Oral Daily  . enoxaparin (LOVENOX) injection  30 mg Subcutaneous Q24H  . [COMPLETED] furosemide  60 mg Intravenous Once  . insulin aspart  0-5 Units Subcutaneous QHS  . insulin aspart  0-9 Units Subcutaneous TID WC  . levofloxacin (LEVAQUIN) IV  500 mg Intravenous Q48H  . sevelamer  800 mg Oral TID WC  . sodium chloride  3 mL Intravenous Q12H  . [DISCONTINUED] albuterol  2.5 mg Nebulization Q4H  . [DISCONTINUED] enoxaparin (LOVENOX) injection  40 mg Subcutaneous Q24H  . [DISCONTINUED] ipratropium  0.5 mg Nebulization Q4H  . [DISCONTINUED] levofloxacin (LEVAQUIN) IV  750 mg Intravenous Once   sodium chloride, sodium chloride, acetaminophen, acetaminophen, [EXPIRED] alteplase, feeding supplement (NEPRO CARB STEADY), heparin, lidocaine, lidocaine-prilocaine, ondansetron (ZOFRAN) IV, ondansetron, pentafluoroprop-tetrafluoroeth  Assessment/ Plan:  Advanced CKD 5. Plan dialysis tonight and tomorrow  Anemia check iron stores and start aranesp  Bones continue binders and vitamin D  Access Use mature AVF  Nutrition Renal vitamins daily  Volume massive anasarca will need dialysis  Will dilaye patient daily. Discussed with Dr Bard Herbert, feels that pulmonary edema is most likely etiology.   LOS: 1 Litzy Dicker W @TODAY @9 :54 AM

## 2012-09-13 NOTE — Progress Notes (Signed)
CRITICAL VALUE ALERT  Critical value received:  0.57  Date of notification:  09/13/2012  Time of notification:  0055,  Viewed by this nurse  Critical value read back:no  Nurse who received alert:  Viewed by this nurse, Danelle Berry D  MD notified (1st page):  Donnamarie Poag  Time of first page:  0058  MD notified (2nd page): Dr. Monica Becton, PCCM  Time of second page: 0015  Responding MD:  Dr. Monica Becton  Time MD responded:  0015  No new orders, continue to monitor.

## 2012-09-13 NOTE — Progress Notes (Signed)
Utilization review completed.  

## 2012-09-13 NOTE — Progress Notes (Signed)
Lab called, and questioned about pt's labs for troponin and Mg. After discussion, was informed that lab will be up to room to draw missed labs.

## 2012-09-13 NOTE — Consult Note (Signed)
Name: Nicolas Mccann MRN: [id number] DOB: [birth year]    LOS: 1  Referring Provider:  [facility] - Dr. Blake Divine Reason for Referral:  Respiratory distresss   PULMONARY / CRITICAL CARE MEDICINE  HPI:  66 y/o M with PMH of HTN, ESRD but not on HD presented to [facility] ED 11/5 with a 3 month history of worsening shortness of breath, orthopnea and nonproductive cough which has worsened over the past month.  Denies fevers, chills, chest pain.  Reports worsened LE swelling R>L.  Reports skipping dose of lasix 11/4.   In ER, he was noted to have saturations in 70's, tachypnea, increased work of breathing and was placed on BiPap.  CXR c/w pulmonary edema.  PCCM called for consult in setting of acute respiratory distress.    Events Since Admission: 10/5 - admit with volume overload / HTN, ESRD, emergent HD  Vital Signs: Temp:  [97.9 F (36.6 C)-98.3 F (36.8 C)] 98.2 F (36.8 C) (11/06 0700) Pulse Rate:  [80-101] 82  (11/06 0700) Resp:  [16-33] 28  (11/06 0700) BP: (100-189)/(56-97) 117/65 mmHg (11/06 0700) SpO2:  [78 %-100 %] 96 % (11/06 0500) FiO2 (%):  [35 %-100 %] 40 % (11/06 0654) Weight:  [141.7 kg (312 lb 6.3 oz)-141.8 kg (312 lb 9.8 oz)] 141.8 kg (312 lb 9.8 oz) (11/06 0500)  Physical Examination: General:  Morbidly obese Neuro:  AAOx4, speech clear, MAE HEENT:  Mm pink/moist,  Neck:  Thick neck, unable to assess for JVD Cardiovascular:  s1s2 rrr, no m/r/g Lungs:  resp's mildly labored, lungs bilaterally with fine crackles Abdomen:  Obese / soft, bsx4 active Musculoskeletal:  No acute deformities Skin:  Warm/dry, chronic lower extremity changes  Principal Problem:  *Acute respiratory failure Active Problems:  ESRD (end stage renal disease)  Pulmonary edema  Leukocytosis  Anemia  ASSESSMENT AND PLAN  PULMONARY  Lab 09/12/12 1657  PHART 7.300*  PCO2ART 38.8  PO2ART 82.0  HCO3 19.1*  O2SAT 95.0   Ventilator Settings: Vent Mode:  [-]  FiO2 (%):  [35 %-100 %] 40 % CXR:  11/5  Enlargement of cardiac silhouette with pulmonary vascular congestion and mild perihilar infiltrates, slightly greater on left, favor edema over infection.   A:   Acute Respiratory Distress - in setting of volume overload / ESRD / HTN ? OSA / OHS  P:   -F/U on CXR. -Hold VQ scan since this is much more likely to be pulmonary edema. -NIPPV PRN for comfort. -Wean O2 to keep > 90%. -Dialysis per renal. -Will need a sleep study as outpatient.  CARDIOVASCULAR  Lab 09/13/12 0605 09/12/12 2254 09/12/12 1755 09/12/12 1329  TROPONINI 0.42* 0.57* 0.32* --  LATICACIDVEN -- -- -- --  PROBNP -- -- -- 3528.0*   ECG:  sr on monitor Lines: PIV  A: Sinus Tachycardia Hypertension - in setting of volume overload / ESRD  P:  -Trend BNP. -Monitor BP, will defer to primary SVC. -ECHO pending.  RENAL  Lab 09/13/12 0605 09/13/12 0325 09/12/12 1754 09/12/12 1329  NA -- 143 -- 143  K -- 4.5 -- 4.6  CL -- 106 -- 102  CO2 -- 22 -- 18*  BUN -- 116* -- 139*  CREATININE -- 10.55* 12.13* 12.13*  CALCIUM -- 9.8 -- 10.9*  MG 2.6* -- 2.9* --  PHOS -- -- 9.2* --   Intake/Output      11/05 0701 - 11/06 0700 11/06 0701 - 11/07 0700   P.O. 360    Total Intake(mL/kg) 360 (  2.5)    Urine (mL/kg/hr) 1300 (0.4) 500 (0.8)   Other 2876    Total Output 4176 500   Net -3816 -500         Foley:    A:   ESRD   P:   -Now HD. -Nephrology consult for additional volume negative.  HEMATOLOGIC  Lab 09/13/12 0325 09/12/12 1754 09/12/12 1329  HGB 7.0* 8.3* 8.2*  HCT 22.0* 25.8* 25.7*  PLT 183 233 226  INR -- -- --  APTT -- -- --   A:   Anemia - likely in setting of chronic disease  P:  -Will defer work up to primary svc.  INFECTIOUS  Lab 09/13/12 0325 09/12/12 1754 09/12/12 1329  WBC 9.8 11.1* 10.7*  PROCALCITON -- -- --   Cultures: 11/5 BCx2>>>  Antibiotics:  A:   No indication of acute infectious process.   P:   -Hold abx for now  A: DM  P: -Per [facility]  Alyson Reedy,  M.D. [facility] Pulmonary/Critical Care Medicine. Pager: [phone]. After hours pager: [phone].

## 2012-09-13 NOTE — Progress Notes (Signed)
eLink Physician-Brief Progress Note Patient Name: Nicolas Mccann DOB: Aug 27, 1946 MRN: [id number]  Date of Service  09/13/2012   HPI/Events of Note    Lab 09/12/12 2254 09/12/12 1755  TROPONINI 0.57* 0.32*   RN calling due to above rising troponins in patient with ESRD, with   Lab 09/12/12 1754 09/12/12 1329  CREATININE 12.13* 12.13*    And on dialysi who has no chest pain Currently on monitor NSR per RN   eICU Interventions  Continue to monitor Likely false positive but await echo   Intervention Category Intermediate Interventions: Other:  Aiden Helzer 09/13/2012, 1:16 AM

## 2012-09-13 NOTE — Progress Notes (Signed)
Text paged Karen,NP about Lasix drip order. New orders received will pass along to day shift.

## 2012-09-13 NOTE — Progress Notes (Signed)
[facility] Progress Note [place] TEAM 1 - Stepdown/ICU TEAM   Nicolas Mccann WUJ:[id number] DOB: [birth year] DOA: 09/12/2012 PCP: Sheila Oats, MD  Brief narrative: 65 year old male with known hypertension and chronic kidney disease. Had not yet started dialysis but has a fistula in place. Endorses progressive shortness of breath for 3 months that has worsened over the past few weeks. Also increasing lower extremity edema. Presented to the emergency department because of hypoxemia and shortness of breath with O2 sats in the 70s. Despite high flow oxygen was only able to maintain saturations in the high 80s and continue with persistent tachypnea and labored respiratory effort so a BiPAP was utilized. His chest x-ray in the ER did demonstrate pulmonary edema. Pulmonary medicine was consulted as well as renal team. Concerns patient may are emergent hemodialysis. Patient apparently endorsed he had previously been receiving healthcare [facility] but has been unable to see a doctor for at least one month and has been out of his medications for at least that long. He was subsequently admitted to the step down unit for further monitoring and treatment  Assessment/Plan:  Acute respiratory failure with hypoxia due to Pulmonary edema *Still requiring oxygen (alt between high flow nasal cannula and a Ventimask) but symptoms have markedly improved as compared to prior to admission *Hemodialysis is primary modality of treatment to resolve pulmonary edema *Appreciate pulmonary medicine recent assistance  Progressive CKD (chronic kidney disease) stage 5, GFR less than 15 ml/min *Appreciate nephrology assistance *So far is in a -4500 cc balance with an additional hemodialysis treatment pending for today  Diabetes mellitus with renal complications *Was supposed to be on Glucotrol XL at home *Hemoglobin A1c 5.9 *continue SSI  Leukocytosis *Reactive secondary to stressors of acute respiratory  failure - now resolved  Anemia in chronic renal disease *Management per renal *Hemoglobin in 2012 was 11.9 and current hemoglobin is between 7 and 8 but suspect there may be a component of hemodilution   HTN (hypertension) *Blood pressure currently well-controlled with dialysis *Was supposed to be on nifedipine prior to admission  DVT prophylaxis: Lovenox with renal adjusted dose Code Status: Full Family Communication: Spoke with patient Disposition Plan: Transfer to renal floor  Consultants: Nephrology Pulmonary medicine  Procedures: None  Antibiotics: None  HPI/Subjective: Patient alert and oriented and denies any chest pain, shortness of breath, dyspnea or orthopnea at this time. Discussed his frustrations with his lack of access to adequate medical care prior to admission.   Objective: Blood pressure 114/66, pulse 81, temperature 98.6 F (37 C), temperature source Oral, resp. rate 22, height 6\' 2"  (1.88 m), weight 138.7 kg (305 lb 12.5 oz), SpO2 94.00%.  Intake/Output Summary (Last 24 hours) at 09/13/12 1336 Last data filed at 09/13/12 1146  Gross per 24 hour  Intake    360 ml  Output   4926 ml  Net  -4566 ml     Exam: General: Mild respiratory distress as evidenced by minimal tachypnea with talking Lungs: Clear to auscultation bilaterally except for fine bibasilar crackles, nasal cannula oxygen at 5 L per minute with saturations 94% Cardiovascular: Regular rate and rhythm without murmur gallop or rub normal S1 and S2 Abdomen: Nontender, nondistended, soft, bowel sounds positive, no rebound, no ascites, no appreciable mass Musculoskeletal: No significant cyanosis, clubbing of bilateral lower extremities Neurological: Alert and oriented x3 moves all extremities x4, exam non-focal  Data Reviewed: Basic Metabolic Panel:  Lab 09/13/12 9562 09/13/12 0325 09/12/12 1754 09/12/12 1329  NA -- 143 -- 143  K -- 4.5 -- 4.6  CL -- 106 -- 102  CO2 -- 22 -- 18*  GLUCOSE  -- 120* -- 87  BUN -- 116* -- 139*  CREATININE -- 10.55* 12.13* 12.13*  CALCIUM -- 9.8 -- 10.9*  MG 2.6* -- 2.9* --  PHOS -- -- 9.2* --   Liver Function Tests:  Lab 09/12/12 1329  AST 17  ALT 17  ALKPHOS 59  BILITOT 0.3  PROT 8.2  ALBUMIN 3.7   CBC:  Lab 09/13/12 0325 09/12/12 1754 09/12/12 1329  WBC 9.8 11.1* 10.7*  NEUTROABS -- -- 8.4*  HGB 7.0* 8.3* 8.2*  HCT 22.0* 25.8* 25.7*  MCV 101.9* 101.2* 100.8*  PLT 183 233 226   Cardiac Enzymes:  Lab 09/13/12 0605 09/12/12 2254 09/12/12 1755  CKTOTAL -- -- --  CKMB -- -- --  CKMBINDEX -- -- --  TROPONINI 0.42* 0.57* 0.32*   BNP (last 3 results)  Basename 09/12/12 1329  PROBNP 3528.0*   CBG:  Lab 09/13/12 1145 09/13/12 0840 09/12/12 2248  GLUCAP 132* 95 70    Recent Results (from the past 240 hour(s))  MRSA PCR SCREENING     Status: Normal   Collection Time   09/12/12  6:50 PM      Component Value Range Status Comment   MRSA by PCR NEGATIVE  NEGATIVE Final      Studies:  Recent x-ray studies have been reviewed in detail by the Attending Physician  Scheduled Meds:  Reviewed in detail by the Attending Physician   Junious Silk, ANP [facility] Office  [phone] Pager [phone]  On-Call/Text Page:      [email or web address]      password [email or web address]  If 7PM-7AM, please contact night-coverage [email or web address] Password [email or web address] 09/13/2012, 1:36 PM   LOS: 1 day   I have personally examined this patient and reviewed the entire database. I have reviewed the above note, made any necessary editorial changes, and agree with its content.  Lonia Blood, MD [facility]

## 2012-09-14 ENCOUNTER — Inpatient Hospital Stay (HOSPITAL_COMMUNITY): Payer: Medicare Other

## 2012-09-14 DIAGNOSIS — N186 End stage renal disease: Secondary | ICD-10-CM

## 2012-09-14 DIAGNOSIS — J811 Chronic pulmonary edema: Secondary | ICD-10-CM

## 2012-09-14 DIAGNOSIS — J9600 Acute respiratory failure, unspecified whether with hypoxia or hypercapnia: Secondary | ICD-10-CM

## 2012-09-14 DIAGNOSIS — N185 Chronic kidney disease, stage 5: Secondary | ICD-10-CM

## 2012-09-14 LAB — RENAL FUNCTION PANEL
Albumin: 3 g/dL — ABNORMAL LOW (ref 3.5–5.2)
CO2: 25 mEq/L (ref 19–32)
Chloride: 101 mEq/L (ref 96–112)
GFR calc Af Amer: 6 mL/min — ABNORMAL LOW (ref >90)
GFR calc non Af Amer: 6 mL/min — ABNORMAL LOW (ref >90)
Potassium: 4.6 mEq/L (ref 3.5–5.1)

## 2012-09-14 LAB — CBC
Platelets: 214 10*3/uL (ref 150–400)
RBC: 2.24 MIL/uL — ABNORMAL LOW (ref 4.22–5.81)
RDW: 15.4 % (ref 11.5–15.5)
WBC: 9.9 10*3/uL (ref 4.0–10.5)

## 2012-09-14 LAB — GLUCOSE, CAPILLARY: Glucose-Capillary: 124 mg/dL — ABNORMAL HIGH (ref 70–99)

## 2012-09-14 LAB — PREPARE RBC (CROSSMATCH)

## 2012-09-14 LAB — FOLATE: Folate: 7.4 ng/mL (ref >5.4)

## 2012-09-14 MED ORDER — PARICALCITOL 5 MCG/ML IV SOLN
2.0000 ug | INTRAVENOUS | Status: DC
  Administered 2012-09-16 – 2012-09-18 (×2): 2 ug via INTRAVENOUS
  Filled 2012-09-14 (×3): qty 0.4

## 2012-09-14 MED ORDER — HEPARIN SODIUM (PORCINE) 1000 UNIT/ML DIALYSIS
20.0000 [IU]/kg | INTRAMUSCULAR | Status: DC | PRN
  Filled 2012-09-14: qty 3

## 2012-09-14 MED ORDER — RENA-VITE PO TABS
1.0000 | ORAL_TABLET | Freq: Every day | ORAL | Status: DC
  Administered 2012-09-15 – 2012-09-19 (×5): 1 via ORAL
  Filled 2012-09-14 (×7): qty 1

## 2012-09-14 MED ORDER — DARBEPOETIN ALFA-POLYSORBATE 60 MCG/0.3ML IJ SOLN: 60.0000 ug | INTRAMUSCULAR | Status: DC

## 2012-09-14 MED ORDER — SODIUM CHLORIDE 0.9 % IV SOLN
125.0000 mg | INTRAVENOUS | Status: DC
  Administered 2012-09-16: 125 mg via INTRAVENOUS
  Filled 2012-09-14 (×3): qty 10

## 2012-09-14 MED ORDER — PENTAFLUOROPROP-TETRAFLUOROETH EX AERO
INHALATION_SPRAY | CUTANEOUS | Status: AC
  Filled 2012-09-14: qty 103.5

## 2012-09-14 NOTE — Progress Notes (Signed)
*  PRELIMINARY RESULTS* Echocardiogram 2D Echocardiogram has been performed.  Nicolas Mccann 09/14/2012, 10:43 AM

## 2012-09-14 NOTE — Progress Notes (Signed)
[facility] Progress Note [place] TEAM 1 - Stepdown/ICU TEAM   Nicolas Mccann YNW:[id number] DOB: 1946/04/26 DOA: 09/12/2012 PCP: Sheila Oats, MD  Brief narrative: 66 year old male with known hypertension and chronic kidney disease. Had not yet started dialysis but has a fistula in place. Endorses progressive shortness of breath for 3 months that has worsened over the past few weeks. Also increasing lower extremity edema. Presented to the emergency department because of hypoxemia and shortness of breath with O2 sats in the 70s. Despite high flow oxygen was only able to maintain saturations in the high 80s and continue with persistent tachypnea and labored respiratory effort so a BiPAP was utilized. His chest x-ray in the ER did demonstrate pulmonary edema. Pulmonary medicine was consulted as well as renal team. Concerns patient may are emergent hemodialysis. Patient apparently endorsed he had previously been receiving healthcare [facility] but has been unable to see a doctor for at least one month and has been out of his medications for at least that long. He was subsequently admitted to the step down unit for further monitoring and treatment  Assessment/Plan:  Acute respiratory failure with hypoxia due to Pulmonary edema *Still requiring oxygen (alt between high flow nasal cannula and a Ventimask) but symptoms have markedly improved as compared to prior to admission *Hemodialysis is primary modality of treatment to resolve pulmonary edema *Appreciate pulmonary medicine recent assistance  Progressive CKD (chronic kidney disease) stage 5, GFR less than 15 ml/min *Appreciate nephrology assistance, Daily HD so far *So far is in a -4500 cc balance with an additional hemodialysis treatment pending for today  Diabetes mellitus with renal complications *Was supposed to be on Glucotrol XL at home *Hemoglobin A1c 5.9 *continue SSI  Leukocytosis *Reactive secondary to stressors of acute  respiratory failure - now resolved  Anemia in chronic renal disease *Management per renal Transfuse with HD tomorrow if ok with renal Anemia panel mild iron deficiency only  HTN (hypertension) *Blood pressure currently well-controlled with dialysis *Was supposed to be on nifedipine prior to admission  DVT prophylaxis: Lovenox  Code Status: Full Family Communication: Spoke with patient Disposition Plan: pending, PT/OT eval  Consultants: Nephrology Pulmonary medicine  Procedures: None  Antibiotics: None  HPI/Subjective: Reports breathing much better, frustrated abt requiring freq HD   Objective: Blood pressure 122/85, pulse 86, temperature 98.5 F (36.9 C), temperature source Oral, resp. rate 19, height 6\' 2"  (1.88 m), weight 136.2 kg (300 lb 4.3 oz), SpO2 96.00%.  Intake/Output Summary (Last 24 hours) at 09/14/12 1306 Last data filed at 09/14/12 0900  Gross per 24 hour  Intake    240 ml  Output   3002 ml  Net  -2762 ml     Exam: General: AAOx3, no distress Lungs: Clear to auscultation bilaterally except for fine bibasilar crackles, nasal cannula oxygen at 5 L per minute with saturations 94% Cardiovascular: Regular rate and rhythm without murmur gallop or rub normal S1 and S2 Abdomen: Nontender, nondistended, soft, bowel sounds positive, no rebound, no ascites, no appreciable mass Musculoskeletal: No significant cyanosis, clubbing of bilateral lower extremities Neurological: Alert and oriented x3 moves all extremities x4, exam non-focal  Data Reviewed: Basic Metabolic Panel:  Lab 09/13/12 6578 09/13/12 0605 09/13/12 0325 09/12/12 1754 09/12/12 1329  NA 145 -- 143 -- 143  K 4.6 -- 4.5 -- 4.6  CL 108 -- 106 -- 102  CO2 22 -- 22 -- 18*  GLUCOSE 120* -- 120* -- 87  BUN 119* -- 116* -- 139*  CREATININE  10.77* -- 10.55* 12.13* 12.13*  CALCIUM 9.7 -- 9.8 -- 10.9*  MG -- 2.6* -- 2.9* --  PHOS 7.9* -- -- 9.2* --   Liver Function Tests:  Lab 09/13/12 1330  09/12/12 1329  AST -- 17  ALT -- 17  ALKPHOS -- 59  BILITOT -- 0.3  PROT -- 8.2  ALBUMIN 3.1* 3.7   CBC:  Lab 09/13/12 1330 09/13/12 0325 09/12/12 1754 09/12/12 1329  WBC 9.2 9.8 11.1* 10.7*  NEUTROABS -- -- -- 8.4*  HGB 7.1* 7.0* 8.3* 8.2*  HCT 22.1* 22.0* 25.8* 25.7*  MCV 100.9* 101.9* 101.2* 100.8*  PLT 189 183 233 226   Cardiac Enzymes:  Lab 09/13/12 0605 09/12/12 2254 09/12/12 1755  CKTOTAL -- -- --  CKMB -- -- --  CKMBINDEX -- -- --  TROPONINI 0.42* 0.57* 0.32*   BNP (last 3 results)  Basename 09/12/12 1329  PROBNP 3528.0*   CBG:  Lab 09/14/12 0725 09/13/12 1715 09/13/12 1145 09/13/12 0840 09/12/12 2248  GLUCAP 99 87 132* 95 70    Recent Results (from the past 240 hour(s))  MRSA PCR SCREENING     Status: Normal   Collection Time   09/12/12  6:50 PM      Component Value Range Status Comment   MRSA by PCR NEGATIVE  NEGATIVE Final        On-Call/Text Page:      [email or web address]      password [email or web address]  If 7PM-7AM, please contact night-coverage [email or web address] Password [email or web address] 09/14/2012, 1:06 PM   LOS: 2 days   I have personally examined this patient and reviewed the entire database. I have reviewed the above note, made any necessary editorial changes, and agree with its content.  Zannie Cove, MD [facility]

## 2012-09-14 NOTE — Progress Notes (Signed)
AVF INFILTRATED IMMEDIATELY AFTER HD TX STARTED. ICE APPLIED, WILL BRICK HIM BACK LATER AND TRANSFUSE 2 UNITS OF PRBCs, HGB 7.1. DR.MATTINGLY AWARE.

## 2012-09-14 NOTE — Procedures (Signed)
Pt seen on HD.  Needle infiltrated and will need to be brought back later.  Hg 7.1.  Will give blood, start renal vit, change oral vit D to iv zemplar.  Start Aranesp

## 2012-09-14 NOTE — Progress Notes (Signed)
[facility] ROUNDING NOTE   Subjective:   Interval History:dyspnea improved  Objective:  Vital signs in last 24 hours:  Temp:  [98.1 F (36.7 C)-98.6 F (37 C)] 98.5 F (36.9 C) (11/07 0900) Pulse Rate:  [79-93] 86  (11/07 0900) Resp:  [15-23] 19  (11/07 0900) BP: (86-135)/(56-85) 122/85 mmHg (11/07 0900) SpO2:  [89 %-100 %] 96 % (11/07 0900) FiO2 (%):  [40 %] 40 % (11/06 1600) Weight:  [136.2 kg (300 lb 4.3 oz)-138.7 kg (305 lb 12.5 oz)] 136.2 kg (300 lb 4.3 oz) (11/06 2011)  Weight change: -3 kg (-6 lb 9.8 oz) Filed Weights   09/13/12 1312 09/13/12 1600 09/13/12 2011  Weight: 138.7 kg (305 lb 12.5 oz) 136.2 kg (300 lb 4.3 oz) 136.2 kg (300 lb 4.3 oz)    Intake/Output: I/O last 3 completed shifts: In: 360 [P.O.:360] Out: 7728 [Urine:2750; Other:4978]   Intake/Output this shift:  Total I/O In: 240 [P.O.:240] Out: -   CVS- RRR  RS- diminidshed air entry  ABD- BS present soft non-distended  EXT- 4 + edema    Basic Metabolic Panel:  Lab 09/13/12 7829 09/13/12 0605 09/13/12 0325 09/12/12 1754 09/12/12 1329  NA 145 -- 143 -- 143  K 4.6 -- 4.5 -- 4.6  CL 108 -- 106 -- 102  CO2 22 -- 22 -- 18*  GLUCOSE 120* -- 120* -- 87  BUN 119* -- 116* -- 139*  CREATININE 10.77* -- 10.55* 12.13* 12.13*  CALCIUM 9.7 -- 9.8 -- 10.9*  MG -- 2.6* -- 2.9* --  PHOS 7.9* -- -- 9.2* --    Liver Function Tests:  Lab 09/13/12 1330 09/12/12 1329  AST -- 17  ALT -- 17  ALKPHOS -- 59  BILITOT -- 0.3  PROT -- 8.2  ALBUMIN 3.1* 3.7   No results found for this basename: LIPASE:5,AMYLASE:5 in the last 168 hours No results found for this basename: AMMONIA:3 in the last 168 hours  CBC:  Lab 09/13/12 1330 09/13/12 0325 09/12/12 1754 09/12/12 1329  WBC 9.2 9.8 11.1* 10.7*  NEUTROABS -- -- -- 8.4*  HGB 7.1* 7.0* 8.3* 8.2*  HCT 22.1* 22.0* 25.8* 25.7*  MCV 100.9* 101.9* 101.2* 100.8*  PLT 189 183 233 226    Cardiac Enzymes:  Lab 09/13/12 0605 09/12/12 2254  09/12/12 1755  CKTOTAL -- -- --  CKMB -- -- --  CKMBINDEX -- -- --  TROPONINI 0.42* 0.57* 0.32*    BNP: No components found with this basename: POCBNP:5  CBG:  Lab 09/14/12 0725 09/13/12 1715 09/13/12 1145 09/13/12 0840 09/12/12 2248  GLUCAP 99 87 132* 95 70    Microbiology: Results for orders placed during the hospital encounter of 09/12/12  MRSA PCR SCREENING     Status: Normal   Collection Time   09/12/12  6:50 PM      Component Value Range Status Comment   MRSA by PCR NEGATIVE  NEGATIVE Final     Coagulation Studies: No results found for this basename: LABPROT:5,INR:5 in the last 72 hours  Urinalysis:  Basename 09/12/12 1504  COLORURINE YELLOW  LABSPEC 1.012  PHURINE 6.0  GLUCOSEU 100*  HGBUR LARGE*  BILIRUBINUR NEGATIVE  KETONESUR NEGATIVE  PROTEINUR 100*  UROBILINOGEN 0.2  NITRITE NEGATIVE  LEUKOCYTESUR NEGATIVE      Imaging: Dg Chest Port 1 View  09/13/2012  *RADIOLOGY REPORT*  Clinical Data: Pulmonary edema follow-up  PORTABLE CHEST - 1 VIEW  Comparison: 09/12/2012  Findings: Prominent cardiomediastinal contours.  Central vascular congestion.  Central peribronchial thickening, interstitial, and left greater right perihilar/infrahilar airspace opacities.  No definite pleural effusion.  No pneumothorax.  No interval osseous change.  IMPRESSION: Prominent cardiomediastinal contours with pulmonary edema pattern, similar to prior.   Original Report Authenticated By: Jearld Lesch, M.D.    Dg Chest Portable 1 View  09/12/2012  *RADIOLOGY REPORT*  Clinical Data: Shortness of breath and chest pain after exertion when catches breath, 1 month in duration, history hypertension, diabetes  PORTABLE CHEST - 1 VIEW  Comparison: Portable exam 1354 hours compared to 09/06/2005  Findings: Enlargement of cardiac silhouette with pulmonary vascular congestion. Mild perihilar infiltrates question edema, infection not excluded. No gross pleural effusion or pneumothorax. Bones  unremarkable.  IMPRESSION: Enlargement of cardiac silhouette with pulmonary vascular congestion and mild perihilar infiltrates, slightly greater on left, favor edema over infection.   Original Report Authenticated By: Ulyses Southward, M.D.      Medications:      . [DISCONTINUED] furosemide (LASIX) infusion        . calcitRIOL  0.5 mcg Oral Daily  . enoxaparin (LOVENOX) injection  30 mg Subcutaneous Q24H  . insulin aspart  0-5 Units Subcutaneous QHS  . insulin aspart  0-9 Units Subcutaneous TID WC  . sevelamer  800 mg Oral TID WC  . sodium chloride  3 mL Intravenous Q12H  . [DISCONTINUED] levofloxacin (LEVAQUIN) IV  500 mg Intravenous Q48H   sodium chloride, sodium chloride, acetaminophen, acetaminophen, feeding supplement (NEPRO CARB STEADY), heparin, lidocaine, lidocaine-prilocaine, ondansetron (ZOFRAN) IV, ondansetron, pentafluoroprop-tetrafluoroeth  Assessment/ Plan:  Advanced CKD 5. Plan dialysis today and tomorrow  Anemiairon stores low will replete and start aranesp  Bones continue binders and vitamin D  Access Use mature AVF  Nutrition Renal vitamins daily  Volume massive anasarca will need dialysis Will dilaye patient daily. Discussed with Dr Bard Herbert, feels that pulmonary edema is most likely etiology.   LOS: 2 Nicolas Mccann W @TODAY @9 :20 AM

## 2012-09-14 NOTE — Progress Notes (Signed)
Pt  Brought back to HD to attempt HD tx again after infiltrating earlier today.  Unable to access, tried x3 and pulled clots all 3 times, pt refuses to be stuck again.  I told him I would have to call the doctor.  Called Dr. Briant Cedar and made him aware of the issue, can feel a thrill, but I can't feel the actual fistula itself, large hematoma still present.  Per Dr. Briant Cedar, bring back to HD tomorrow and try again.

## 2012-09-14 NOTE — Progress Notes (Addendum)
Name: Nicolas Mccann MRN: [id number] DOB: 28-Dec-1945    LOS: 2  Referring Provider:  [facility] - Dr. Blake Divine Reason for Referral:  Respiratory distresss   PULMONARY / CRITICAL CARE MEDICINE  HPI:  66 y/o M with PMH of HTN, ESRD but not on HD presented to [facility] ED 11/5 with a 3 month history of worsening shortness of breath, orthopnea and nonproductive cough which has worsened over the past month.  Denies fevers, chills, chest pain.  Reports worsened LE swelling R>L.  Reports skipping dose of lasix 11/4.   In ER, he was noted to have saturations in 70's, tachypnea, increased work of breathing and was placed on BiPap.  CXR c/w pulmonary edema.  PCCM called for consult in setting of acute respiratory distress.    Events Since Admission: 11/5 - admit with volume overload / HTN, ESRD, emergent HD 11-7 less sob  Vital Signs: Temp:  [98.1 F (36.7 C)-98.6 F (37 C)] 98.5 F (36.9 C) (11/07 0900) Pulse Rate:  [79-93] 86  (11/07 0900) Resp:  [15-23] 19  (11/07 0900) BP: (86-135)/(56-85) 122/85 mmHg (11/07 0900) SpO2:  [89 %-100 %] 96 % (11/07 0900) FiO2 (%):  [40 %] 40 % (11/06 1600) Weight:  [136.2 kg (300 lb 4.3 oz)-138.7 kg (305 lb 12.5 oz)] 136.2 kg (300 lb 4.3 oz) (11/06 2011)  Physical Examination: General:  Morbidly obese, on O2 nad at rest Neuro:  AAOx4, speech clear, MAE HEENT:  Mm pink/moist,  Neck:  Thick neck,  Cardiovascular:  s1s2 rrr, no m/r/g Lungs:  resp's not labored, lungs bilaterally with fine crackles Abdomen:  Obese / soft, bsx4 active Musculoskeletal:  No acute deformities Skin:  Warm/dry, chronic lower extremity changes  Principal Problem:  *Acute respiratory failure with hypoxia Active Problems:  Progressive CKD (chronic kidney disease) stage 5, GFR less than 15 ml/min  Pulmonary edema  Leukocytosis  Anemia in chronic renal disease  Diabetes mellitus with renal complications  HTN (hypertension)  ASSESSMENT AND PLAN  PULMONARY  Lab 09/12/12 1657  PHART 7.300*    PCO2ART 38.8  PO2ART 82.0  HCO3 19.1*  O2SAT 95.0   Ventilator Settings: Vent Mode:  [-]  FiO2 (%):  [40 %] 40 % CXR: .  Dg Chest Port 1 View  09/13/2012  *RADIOLOGY REPORT*  Clinical Data: Pulmonary edema follow-up  PORTABLE CHEST - 1 VIEW  Comparison: 09/12/2012  Findings: Prominent cardiomediastinal contours.  Central vascular congestion.  Central peribronchial thickening, interstitial, and left greater right perihilar/infrahilar airspace opacities.  No definite pleural effusion.  No pneumothorax.  No interval osseous change.  IMPRESSION: Prominent cardiomediastinal contours with pulmonary edema pattern, similar to prior.   Original Report Authenticated By: Jearld Lesch, M.D.    Dg Chest Portable 1 View  09/12/2012  *RADIOLOGY REPORT*  Clinical Data: Shortness of breath and chest pain after exertion when catches breath, 1 month in duration, history hypertension, diabetes  PORTABLE CHEST - 1 VIEW  Comparison: Portable exam 1354 hours compared to 09/06/2005  Findings: Enlargement of cardiac silhouette with pulmonary vascular congestion. Mild perihilar infiltrates question edema, infection not excluded. No gross pleural effusion or pneumothorax. Bones unremarkable.  IMPRESSION: Enlargement of cardiac silhouette with pulmonary vascular congestion and mild perihilar infiltrates, slightly greater on left, favor edema over infection.   Original Report Authenticated By: Ulyses Southward, M.D.     A:   Acute Respiratory Distress - in setting of volume overload / ESRD / HTN ? OSA / OHS. Better with HD [employer]  09/13/12 1312 09/13/12 1600 09/13/12 2011  Weight: 138.7 kg (305 lb 12.5 oz) 136.2 kg (300 lb 4.3 oz) 136.2 kg (300 lb 4.3 oz)   P:   -F/U on CXR. -Hold VQ scan since this is much more likely to be pulmonary edema. -NIPPV PRN for comfort. -Wean O2 to keep > 90%. -Dialysis per renal. -Will need a sleep study as outpatient.  CARDIOVASCULAR  Lab 09/13/12 0605 09/12/12 2254  09/12/12 1755 09/12/12 1329  TROPONINI 0.42* 0.57* 0.32* --  LATICACIDVEN -- -- -- --  PROBNP -- -- -- 3528.0*   ECG:  sr on monitor Lines: PIV  A: Sinus Tachycardia Hypertension - in setting of volume overload / ESRD  P:  -Trend BNP. -Monitor BP, will defer to primary SVC. -ECHO pending.  RENAL  Lab 09/13/12 1330 09/13/12 0605 09/13/12 0325 09/12/12 1754 09/12/12 1329  NA 145 -- 143 -- 143  K 4.6 -- 4.5 -- --  CL 108 -- 106 -- 102  CO2 22 -- 22 -- 18*  BUN 119* -- 116* -- 139*  CREATININE 10.77* -- 10.55* 12.13* 12.13*  CALCIUM 9.7 -- 9.8 -- 10.9*  MG -- 2.6* -- 2.9* --  PHOS 7.9* -- -- 9.2* --   Intake/Output      11/06 0701 - 11/07 0700 11/07 0701 - 11/08 0700   P.O.  240   Total Intake(mL/kg)  240 (1.8)   Urine (mL/kg/hr) 1650 (0.5)    Other 2102    Total Output 3752    Net -3752 +240        Stool Occurrence 1 x     Foley:    A:   ESRD   P:   -Now HD. -Nephrology consult for additional volume negative.  HEMATOLOGIC  Lab 09/13/12 1330 09/13/12 0325 09/12/12 1754 09/12/12 1329  HGB 7.1* 7.0* 8.3* 8.2*  HCT 22.1* 22.0* 25.8* 25.7*  PLT 189 183 233 226  INR -- -- -- --  APTT -- -- -- --   A:   Anemia - likely in setting of chronic disease  P:  -Will defer work up to primary svc.  INFECTIOUS  Lab 09/13/12 1330 09/13/12 0325 09/12/12 1754 09/12/12 1329  WBC 9.2 9.8 11.1* 10.7*  PROCALCITON -- -- -- --   Cultures: 11/5 BCx2>>>  Antibiotics:  A:   No indication of acute infectious process.   P:   -Hold abx for now  A: DM  P: -Per [facility]   Summary: Better with HD. Wean O2 as tolerated  Brett Canales Minor ACNP Adolph Pollack PCCM Pager [phone] till 3 pm If no answer page [phone] 09/14/2012, 10:00 AM  Improved with diureses, titrate O2 down for sat of 88-92%.  Little else to do from a pulmonary standpoint, please arrange for f/u with PCCM as outpatient as patient has all the classic H&P findings of OSA.  PCCM will sign off, please call back  if needed.  Patient seen and examined, agree with above note.  I dictated the care and orders written for this patient under my direction.  Koren Bound, M.D. [phone]

## 2012-09-15 ENCOUNTER — Inpatient Hospital Stay (HOSPITAL_COMMUNITY): Payer: Medicare Other | Admitting: Anesthesiology

## 2012-09-15 ENCOUNTER — Inpatient Hospital Stay (HOSPITAL_COMMUNITY): Payer: Medicare Other

## 2012-09-15 ENCOUNTER — Encounter (HOSPITAL_COMMUNITY)
Admission: EM | Disposition: A | Discharge status: Home Health | Payer: Self-pay | Source: Home / Self Care | Attending: Internal Medicine

## 2012-09-15 ENCOUNTER — Encounter (HOSPITAL_COMMUNITY): Payer: Self-pay | Admitting: Anesthesiology

## 2012-09-15 DIAGNOSIS — N186 End stage renal disease: Secondary | ICD-10-CM

## 2012-09-15 DIAGNOSIS — E1129 Type 2 diabetes mellitus with other diabetic kidney complication: Secondary | ICD-10-CM

## 2012-09-15 DIAGNOSIS — N058 Unspecified nephritic syndrome with other morphologic changes: Secondary | ICD-10-CM

## 2012-09-15 HISTORY — PX: INSERTION OF DIALYSIS CATHETER: SHX1324

## 2012-09-15 LAB — RENAL FUNCTION PANEL
CO2: 24 mEq/L (ref 19–32)
Calcium: 9.9 mg/dL (ref 8.4–10.5)
Creatinine, Ser: 9.05 mg/dL — ABNORMAL HIGH (ref 0.50–1.35)
GFR calc Af Amer: 6 mL/min — ABNORMAL LOW (ref >90)
GFR calc non Af Amer: 5 mL/min — ABNORMAL LOW (ref >90)
Glucose, Bld: 164 mg/dL — ABNORMAL HIGH (ref 70–99)
Phosphorus: 7.6 mg/dL — ABNORMAL HIGH (ref 2.3–4.6)
Sodium: 142 mEq/L (ref 135–145)

## 2012-09-15 LAB — CBC
HCT: 27.5 % — ABNORMAL LOW (ref 39.0–52.0)
Hemoglobin: 8.6 g/dL — ABNORMAL LOW (ref 13.0–17.0)
MCH: 31.7 pg (ref 26.0–34.0)
MCHC: 31.3 g/dL (ref 30.0–36.0)
RDW: 15.4 % (ref 11.5–15.5)

## 2012-09-15 LAB — BASIC METABOLIC PANEL
BUN: 94 mg/dL — ABNORMAL HIGH (ref 6–23)
Calcium: 10 mg/dL (ref 8.4–10.5)
Creatinine, Ser: 9.24 mg/dL — ABNORMAL HIGH (ref 0.50–1.35)
GFR calc Af Amer: 6 mL/min — ABNORMAL LOW (ref >90)
GFR calc non Af Amer: 5 mL/min — ABNORMAL LOW (ref >90)
Glucose, Bld: 169 mg/dL — ABNORMAL HIGH (ref 70–99)
Potassium: 4.6 mEq/L (ref 3.5–5.1)

## 2012-09-15 LAB — GLUCOSE, CAPILLARY: Glucose-Capillary: 132 mg/dL — ABNORMAL HIGH (ref 70–99)

## 2012-09-15 SURGERY — INSERTION OF DIALYSIS CATHETER
Anesthesia: Monitor Anesthesia Care | Site: Chest | Wound class: Clean

## 2012-09-15 MED ORDER — HYDROMORPHONE HCL PF 1 MG/ML IJ SOLN
1.0000 mg | INTRAMUSCULAR | Status: DC | PRN
  Administered 2012-09-15 – 2012-09-17 (×3): 1 mg via INTRAVENOUS
  Filled 2012-09-15: qty 1

## 2012-09-15 MED ORDER — FENTANYL CITRATE 0.05 MG/ML IJ SOLN: 25.0000 ug | INTRAMUSCULAR | Status: DC | PRN

## 2012-09-15 MED ORDER — FENTANYL CITRATE 0.05 MG/ML IJ SOLN
INTRAMUSCULAR | Status: DC | PRN
  Administered 2012-09-15 (×2): 25 ug via INTRAVENOUS

## 2012-09-15 MED ORDER — OXYCODONE HCL 5 MG PO TABS
5.0000 mg | ORAL_TABLET | Freq: Four times a day (QID) | ORAL | Status: DC | PRN
  Administered 2012-09-15 – 2012-09-16 (×3): 5 mg via ORAL
  Filled 2012-09-15 (×3): qty 1

## 2012-09-15 MED ORDER — HEPARIN SODIUM (PORCINE) 5000 UNIT/ML IJ SOLN
INTRAMUSCULAR | Status: DC | PRN
  Administered 2012-09-15: 15:00:00

## 2012-09-15 MED ORDER — MIDAZOLAM HCL 5 MG/5ML IJ SOLN
INTRAMUSCULAR | Status: DC | PRN
  Administered 2012-09-15 (×2): 1 mg via INTRAVENOUS

## 2012-09-15 MED ORDER — SODIUM CHLORIDE 0.9 % IV SOLN
INTRAVENOUS | Status: DC | PRN
  Administered 2012-09-15: 14:00:00 via INTRAVENOUS

## 2012-09-15 MED ORDER — OXYCODONE HCL 5 MG PO TABS: 5.0000 mg | ORAL_TABLET | Freq: Once | ORAL | Status: AC | PRN

## 2012-09-15 MED ORDER — HEPARIN SODIUM (PORCINE) 1000 UNIT/ML IJ SOLN
INTRAMUSCULAR | Status: DC | PRN
  Administered 2012-09-15: 10 mL

## 2012-09-15 MED ORDER — METOCLOPRAMIDE HCL 5 MG/ML IJ SOLN
10.0000 mg | Freq: Once | INTRAMUSCULAR | Status: AC | PRN
  Filled 2012-09-15: qty 2

## 2012-09-15 MED ORDER — HEPARIN SODIUM (PORCINE) 1000 UNIT/ML IJ SOLN
INTRAMUSCULAR | Status: AC
  Filled 2012-09-15: qty 1

## 2012-09-15 MED ORDER — HYDROMORPHONE HCL PF 1 MG/ML IJ SOLN
INTRAMUSCULAR | Status: AC
  Administered 2012-09-15: 1 mg via INTRAVENOUS
  Filled 2012-09-15: qty 1

## 2012-09-15 MED ORDER — LIDOCAINE-EPINEPHRINE (PF) 1 %-1:200000 IJ SOLN
INTRAMUSCULAR | Status: DC | PRN
  Administered 2012-09-15: 30 mL

## 2012-09-15 MED ORDER — DEXTROSE 5 % IV SOLN
1.5000 g | INTRAVENOUS | Status: AC
  Administered 2012-09-15: 1.5 g via INTRAVENOUS
  Filled 2012-09-15: qty 1.5

## 2012-09-15 MED ORDER — OXYCODONE HCL 5 MG/5ML PO SOLN: 5.0000 mg | Freq: Once | ORAL | Status: AC | PRN

## 2012-09-15 MED ORDER — 0.9 % SODIUM CHLORIDE (POUR BTL) OPTIME
TOPICAL | Status: DC | PRN
  Administered 2012-09-15: 1000 mL

## 2012-09-15 MED ORDER — LIDOCAINE-EPINEPHRINE (PF) 1 %-1:200000 IJ SOLN
INTRAMUSCULAR | Status: AC
  Filled 2012-09-15: qty 10

## 2012-09-15 SURGICAL SUPPLY — 42 items
BAG DECANTER FOR FLEXI CONT (MISCELLANEOUS) ×2 IMPLANT
BAG SNAP BAND KOVER 36X36 (MISCELLANEOUS) ×2 IMPLANT
CATH CANNON HEMO 15F 50CM (CATHETERS) IMPLANT
CATH CANNON HEMO 15FR 19 (HEMODIALYSIS SUPPLIES) IMPLANT
CATH CANNON HEMO 15FR 23CM (HEMODIALYSIS SUPPLIES) ×2 IMPLANT
CATH CANNON HEMO 15FR 31CM (HEMODIALYSIS SUPPLIES) IMPLANT
CATH CANNON HEMO 15FR 32CM (HEMODIALYSIS SUPPLIES) IMPLANT
CHLORAPREP W/TINT 26ML (MISCELLANEOUS) ×2 IMPLANT
CLOTH BEACON ORANGE TIMEOUT ST (SAFETY) ×2 IMPLANT
COVER DOME SNAP 22 D (MISCELLANEOUS) ×2 IMPLANT
COVER PROBE W GEL 5X96 (DRAPES) IMPLANT
COVER SURGICAL LIGHT HANDLE (MISCELLANEOUS) ×2 IMPLANT
DRAPE C-ARM 42X72 X-RAY (DRAPES) IMPLANT
DRAPE CHEST BREAST 15X10 FENES (DRAPES) ×2 IMPLANT
GAUZE SPONGE 2X2 8PLY STRL LF (GAUZE/BANDAGES/DRESSINGS) ×1 IMPLANT
GAUZE SPONGE 4X4 16PLY XRAY LF (GAUZE/BANDAGES/DRESSINGS) ×2 IMPLANT
GLOVE BIO SURGEON STRL SZ7.5 (GLOVE) ×2 IMPLANT
GLOVE BIOGEL PI IND STRL 8 (GLOVE) ×1 IMPLANT
GLOVE BIOGEL PI INDICATOR 8 (GLOVE) ×1
GLOVE ECLIPSE 7.5 STRL STRAW (GLOVE) ×2 IMPLANT
GOWN PREVENTION PLUS XLARGE (GOWN DISPOSABLE) ×2 IMPLANT
GOWN STRL NON-REIN LRG LVL3 (GOWN DISPOSABLE) ×2 IMPLANT
KIT BASIN OR (CUSTOM PROCEDURE TRAY) ×2 IMPLANT
KIT ROOM TURNOVER OR (KITS) ×2 IMPLANT
NEEDLE 18GX1X1/2 (RX/OR ONLY) (NEEDLE) ×2 IMPLANT
NEEDLE 22X1 1/2 (OR ONLY) (NEEDLE) ×2 IMPLANT
NEEDLE HYPO 25GX1X1/2 BEV (NEEDLE) ×2 IMPLANT
NS IRRIG 1000ML POUR BTL (IV SOLUTION) ×2 IMPLANT
PACK SURGICAL SETUP 50X90 (CUSTOM PROCEDURE TRAY) ×2 IMPLANT
PAD ARMBOARD 7.5X6 YLW CONV (MISCELLANEOUS) ×4 IMPLANT
SPONGE GAUZE 2X2 STER 10/PKG (GAUZE/BANDAGES/DRESSINGS) ×1
SUT ETHILON 3 0 PS 1 (SUTURE) ×2 IMPLANT
SUT VICRYL 4-0 PS2 18IN ABS (SUTURE) ×2 IMPLANT
SYR 20CC LL (SYRINGE) ×4 IMPLANT
SYR 30ML LL (SYRINGE) IMPLANT
SYR 5ML LL (SYRINGE) ×4 IMPLANT
SYR CONTROL 10ML LL (SYRINGE) ×2 IMPLANT
SYRINGE 10CC LL (SYRINGE) ×2 IMPLANT
TAPE CLOTH SURG 4X10 WHT LF (GAUZE/BANDAGES/DRESSINGS) ×2 IMPLANT
TOWEL OR 17X24 6PK STRL BLUE (TOWEL DISPOSABLE) ×2 IMPLANT
TOWEL OR 17X26 10 PK STRL BLUE (TOWEL DISPOSABLE) ×2 IMPLANT
WATER STERILE IRR 1000ML POUR (IV SOLUTION) IMPLANT

## 2012-09-15 NOTE — Progress Notes (Signed)
Pt returned from OR - alert and oriented. VSS. O2 @ 2L. Condition stable.

## 2012-09-15 NOTE — Progress Notes (Signed)
[facility] Progress Note    Nicolas Mccann WJX:[id number] DOB: 08/04/1946 DOA: 09/12/2012 PCP: Sheila Oats, MD  Brief narrative: 66 year old male with known hypertension and chronic kidney disease. Had not yet started dialysis but has a fistula in place. Endorses progressive shortness of breath for 3 months that has worsened over the past few weeks. Also increasing lower extremity edema. Presented to the emergency department because of hypoxemia and shortness of breath with O2 sats in the 70s. Despite high flow oxygen was only able to maintain saturations in the high 80s and continue with persistent tachypnea and labored respiratory effort so a BiPAP was utilized. His chest x-ray in the ER did demonstrate pulmonary edema. Pulmonary medicine was consulted as well as renal team. Concerns patient may are emergent hemodialysis. Patient apparently endorsed he had previously been receiving healthcare [facility] but has been unable to see a doctor for at least one month and has been out of his medications for at least that long. 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, GFR less than 15 ml/min New HD *Appreciate nephrology assistance, Daily HD so far Access issues for HD catheter and fistulogram  Diabetes mellitus with renal complications *Was supposed to be on Glucotrol XL at home *Hemoglobin A1c 5.9, CBG stable *continue SSI  Leukocytosis *Reactive secondary to stressors of acute respiratory failure - now resolved  Anemia in chronic renal disease *Management per renal Transfused 1 unit, check CBC in am aranesp with HD  and IV Iron Anemia panel mild iron deficiency only  HTN (hypertension) *Blood pressure currently well-controlled with dialysis   DVT prophylaxis: Lovenox  Code Status: Full Family Communication: Spoke with patient Disposition Plan: pending, PT/OT eval  Consultants: Nephrology Pulmonary medicine  Procedures: None  Antibiotics: None  HPI/Subjective: Reports breathing much better, doing better overall, hasnt gotten up much   Objective: Blood pressure 109/68, pulse 79, temperature 98.3 F (36.8 C), temperature source Oral, resp. rate 20, height 6\' 2"  (1.88 m), weight 136.3 kg (300 lb 7.8 oz), SpO2 100.00%.  Intake/Output Summary (Last 24 hours) at 09/15/12 1406 Last data filed at 09/15/12 1300  Gross per 24 hour  Intake   1065 ml  Output   1225 ml  Net   -160 ml     Exam: General: AAOx3, no distress Lungs: Clear to auscultation bilaterally except for fine bibasilar crackles, nasal cannula oxygen at 5 L per minute with saturations 94% Cardiovascular: Regular rate and rhythm without murmur gallop or rub normal S1 and S2 Abdomen: Nontender, nondistended, soft, bowel sounds positive, no rebound, no ascites, no appreciable mass Musculoskeletal: No significant cyanosis, clubbing of bilateral lower extremities Neurological: Alert and oriented x3 moves all extremities x4, exam non-focal Ext: 1 plus edema  Data Reviewed: Basic Metabolic Panel:  Lab 09/15/12 2130 09/14/12 1655 09/13/12 1330 09/13/12 0605 09/13/12 0325 09/12/12 1754 09/12/12 1329  NA 142 141 145 -- 143 -- 143  K 4.6 4.6 4.6 -- 4.5 -- 4.6  CL 102 101 108 -- 106 -- 102  CO2 24 25 22  -- 22 -- 18*  GLUCOSE 164* 141* 120* -- 120* -- 87  BUN 95* 93* 119* -- 116* -- 139*  CREATININE  9.05* 8.70* 10.77* -- 10.55* 12.13* --  CALCIUM 9.9 9.7 9.7 -- 9.8 -- 10.9*  MG -- -- -- 2.6* -- 2.9* --  PHOS 7.6* 7.4* 7.9* -- -- 9.2* --   Liver Function Tests:  Lab 09/15/12 1020 09/14/12 1655 09/13/12 1330 09/12/12 1329    AST -- -- -- 17  ALT -- -- -- 17  ALKPHOS -- -- -- 59  BILITOT -- -- -- 0.3  PROT -- -- -- 8.2  ALBUMIN 3.1* 3.0* 3.1* 3.7   CBC:  Lab 09/14/12 1631 09/13/12 1330 09/13/12 0325 09/12/12 1754 09/12/12 1329  WBC 9.9 9.2 9.8 11.1* 10.7*  NEUTROABS -- -- -- -- 8.4*  HGB 7.2* 7.1* 7.0* 8.3* 8.2*  HCT 22.8* 22.1* 22.0* 25.8* 25.7*  MCV 101.8* 100.9* 101.9* 101.2* 100.8*  PLT 214 189 183 233 226   Cardiac Enzymes:  Lab 09/13/12 0605 09/12/12 2254 09/12/12 1755  CKTOTAL -- -- --  CKMB -- -- --  CKMBINDEX -- -- --  TROPONINI 0.42* 0.57* 0.32*   BNP (last 3 results)  Basename 09/12/12 1329  PROBNP 3528.0*   CBG:  Lab 09/15/12 1159 09/15/12 0759 09/14/12 2258 09/14/12 1703 09/14/12 1308  GLUCAP 112* 132* 144* 124* 133*    Recent Results (from the past 240 hour(s))  MRSA PCR SCREENING     Status: Normal   Collection Time   09/12/12  6:50 PM      Component Value Range Status Comment   MRSA by PCR NEGATIVE  NEGATIVE Final        On-Call/Text Page:      [email or web address]      password [email or web address]  If 7PM-7AM, please contact night-coverage [email or web address] Password [email or web address] 09/15/2012, 2:06 PM   LOS: 3 days   I have personally examined this patient and reviewed the entire database. I have reviewed the above note, made any necessary editorial changes, and agree with its content.  Zannie Cove, MD [facility]

## 2012-09-15 NOTE — Consult Note (Signed)
[facility] [place] CONSULT NOTE 09/15/2012 DOB: [id number] MRN : [id number]  CC: ESRD with infiltrated left Brachiocephalic AVF - ? function Referring Physician: Elvis Coil, MD  History of Present Illness: Nicolas Mccann is a 66 y.o. male who had a left brachiocephalic AVF placed around 1998 per patient ( Dr Hyman Hopes states it may have been put in approx 2 years ago) This has never been used. Pt is now in need of HD. The fistula was not functional/accessible per attempt at HD this am despite thrill in AVF. Pt denies any steal symptoms in the left hand. Pt has very large arm.  Pt will need percath so that he may begin dialysis and poss fistulogram to evaluate fistula.  Per admission H&P: pt admitted with Hypertension, ESRD not on dialysis but has arm fistula, reports being short of breath over the last three months , which has worsened over the last few weeks, associated with productive cough with sputum, no subjective fevers, orthopnea and PND present. Leg edema worsening over the last few weeks, right > left. On arrival to ED he was found to be hypoxemic with oxygen sats in 70's, he was put on 6 lit of nasal canula oxygen with oxygen sats in high 80's. He is tachypnic with increased work of breathing and was put on BIPAP. CXR showed pulmonary edema and we were called to admit patient for acute respiratory failure secondary to fluid over load from ESRD.   Past Medical History  Diagnosis Date  . Renal failure   . Diabetes mellitus without complication     No past surgical history on file.   ROS: [x]  Positive  [ ]  Denies    General: [ ]  Weight loss, [ ]  Fever, [ ]  chills Neurologic: [ ]  Dizziness, [ ]  Blackouts, [ ]  Seizure [ ]  Stroke, [ ]  "Mini stroke", [ ]  Slurred speech, [ ]  Temporary blindness; [ ]  weakness in arms or legs, [ ]  Hoarseness Cardiac: [ ]  Chest pain/pressure, [ ]  Shortness of breath at rest [ ]  Shortness of breath with exertion, [ ]  Atrial fibrillation or  irregular heartbeat Vascular: [ ]  Pain in legs with walking, [ ]  Pain in legs at rest, [ ]  Pain in legs at night,  [ ]  Non-healing ulcer, [ ]  Blood clot in vein/DVT,   Pulmonary: [ ]  Home oxygen, [ ]  Productive cough, [ ]  Coughing up blood, [ ]  Asthma,  [ ]  Wheezing Musculoskeletal:  [ ]  Arthritis, [ ]  Low back pain, [ ]  Joint pain Hematologic: [ ]  Easy Bruising, [ ]  Anemia; [ ]  Hepatitis Gastrointestinal: [ ]  Blood in stool, [ ]  Gastroesophageal Reflux/heartburn, [ ]  Trouble swallowing Urinary: [ x] chronic Kidney disease, [ ]  on HD - [ ]  MWF or [ ]  TTHS, [ ]  Burning with urination, [ ]  Difficulty urinating Skin: [ ]  Rashes, [ ]  Wounds Psychological: [ ]  Anxiety, [ ]  Depression  Social History History  Substance Use Topics  . Smoking status: Never Smoker   . Smokeless tobacco: Not on file  . Alcohol Use: No    Family History History reviewed. No pertinent family history.  No Known Allergies  Current Facility-Administered Medications  Medication Dose Route Frequency Provider Last Rate Last Dose  . 0.9 %  sodium chloride infusion  100 mL Intravenous PRN Garnetta Buddy, MD      . 0.9 %  sodium chloride infusion  100 mL Intravenous PRN Garnetta Buddy, MD      .  acetaminophen (TYLENOL) tablet 650 mg  650 mg Oral Q6H PRN Kathlen Mody, MD   650 mg at 09/15/12 0518   Or  . acetaminophen (TYLENOL) suppository 650 mg  650 mg Rectal Q6H PRN Kathlen Mody, MD      . darbepoetin (ARANESP) injection 60 mcg  60 mcg Intravenous Q Thu-HD Dyke Maes, MD      . enoxaparin (LOVENOX) injection 30 mg  30 mg Subcutaneous Q24H Doug Sou, MD   30 mg at 09/14/12 2340  . feeding supplement (NEPRO CARB STEADY) liquid 237 mL  237 mL Oral PRN Garnetta Buddy, MD      . ferric gluconate (NULECIT) 125 mg in sodium chloride 0.9 % 100 mL IVPB  125 mg Intravenous Q T,Th,Sa-HD Garnetta Buddy, MD      . heparin injection 1,000 Units  1,000 Units Dialysis PRN Garnetta Buddy, MD      . heparin injection 2,700  Units  20 Units/kg Dialysis PRN Garnetta Buddy, MD      . HYDROmorphone (DILAUDID) injection 1 mg  1 mg Intravenous Q4H PRN Zannie Cove, MD   1 mg at 09/15/12 0956  . insulin aspart (novoLOG) injection 0-5 Units  0-5 Units Subcutaneous QHS Kathlen Mody, MD      . insulin aspart (novoLOG) injection 0-9 Units  0-9 Units Subcutaneous TID WC Kathlen Mody, MD   1 Units at 09/15/12 0925  . lidocaine (XYLOCAINE) 1 % injection 5 mL  5 mL Intradermal PRN Garnetta Buddy, MD      . lidocaine-prilocaine (EMLA) cream 1 application  1 application Topical PRN Garnetta Buddy, MD      . multivitamin (RENA-VIT) tablet 1 tablet  1 tablet Oral Daily Dyke Maes, MD   1 tablet at 09/15/12 [phone]  . ondansetron (ZOFRAN) tablet 4 mg  4 mg Oral Q6H PRN Kathlen Mody, MD       Or  . ondansetron (ZOFRAN) injection 4 mg  4 mg Intravenous Q6H PRN Kathlen Mody, MD      . paricalcitol (ZEMPLAR) injection 2 mcg  2 mcg Intravenous 3 times weekly Dyke Maes, MD      . pentafluoroprop-tetrafluoroeth Peggye Pitt) aerosol 1 application  1 application Topical PRN Garnetta Buddy, MD      . [EXPIRED] pentafluoroprop-tetrafluoroeth (GEBAUERS) aerosol           . sevelamer (RENVELA) tablet 800 mg  800 mg Oral TID WC Kathlen Mody, MD   800 mg at 09/15/12 0924  . sodium chloride 0.9 % injection 3 mL  3 mL Intravenous Q12H Kathlen Mody, MD   3 mL at 09/14/12 2340  . [DISCONTINUED] calcitRIOL (ROCALTROL) capsule 0.5 mcg  0.5 mcg Oral Daily Kathlen Mody, MD   0.5 mcg at 09/14/12 1114     Imaging: Dg Chest 2 View  09/14/2012  *RADIOLOGY REPORT*  Clinical Data: Follow-up edema.  Diabetes and hypertension. Dialysis patient  CHEST - 2 VIEW  Comparison: 09/13/2012  Findings: Cardiomegaly is identified and appears stable. There is persistent pulmonary vascular congestion, small bilateral pleural effusions and fissural fluid identified compatible with mild pulmonary edema.  This is unchanged in comparison with the prior exam.  No new  focal infiltrates are identified.  Bony structures demonstrate some degenerative change of the lower thoracic spine.  IMPRESSION: Unchanged pulmonary vascular congestion and mild pulmonary edema pattern.   Original Report Authenticated By: Rhodia Albright, M.D.     Significant Diagnostic Studies: CBC Lab Results  Component Value Date   WBC 9.9 09/14/2012   HGB 7.2* 09/14/2012   HCT 22.8* 09/14/2012   MCV 101.8* 09/14/2012   PLT 214 09/14/2012    BMET    Component Value Date/Time   NA 142 09/15/2012 1020   K 4.6 09/15/2012 1020   CL 102 09/15/2012 1020   CO2 24 09/15/2012 1020   GLUCOSE 164* 09/15/2012 1020   BUN 95* 09/15/2012 1020   CREATININE 9.05* 09/15/2012 1020   CALCIUM 9.9 09/15/2012 1020   CALCIUM 9.2 01/13/2011 2249   GFRNONAA 5* 09/15/2012 1020   GFRAA 6* 09/15/2012 1020    COAG No results found for this basename: INR, PROTIME   No results found for this basename: PTT     Physical Examination BP Readings from Last 3 Encounters:  09/15/12 115/61  09/15/12 115/61   Temp Readings from Last 3 Encounters:  09/15/12 98.4 F (36.9 C) Oral  09/15/12 98.4 F (36.9 C) Oral   SpO2 Readings from Last 3 Encounters:  09/15/12 94%  09/15/12 94%   Pulse Readings from Last 3 Encounters:  09/15/12 84  09/15/12 84    General:  WDWN in NAD Gait: Normal HENT: WNL Pulmonary: normal non-labored breathing , without Rales, rhonchi,  wheezing Cardiac: RRR, without  Murmurs, rubs or gallops; Abdomen: soft, NT,  Skin: no rashes, ulcers noted Vascular Exam/Pulses:3+ radial pulses bilat LUE with ecchymosis and tenderness over left B-C AVF Positive Bruit and Mod flow per doppler in AVF Pt has a large arm and this fistula is deep and not palpable through the skin Extremities without ischemic changes, no Gangrene , no cellulitis; no open wounds;  Musculoskeletal: no muscle wasting or atrophy  Neurologic: A&O X 3; Appropriate Affect ;  SENSATION: normal; MOTOR FUNCTION: Pt has good and  equal strength in all extremities - 5/5 Speech is fluent/normal  Non-Invasive Vascular Imaging: none  ASSESSMENT/PLAN: Nicolas Mccann is a 66 y.o. male with ESRD stage 5 who is now in need of HD. Left B-C AVF has a bruit and good doppler flow but is deep secondary to the size of this pt arm. Pt will need a permcath to begin HD and prob USG to check for branches and depth of fistula and/or fistulogram to assess anastomosis Will make pt NPO - Pt ate at 8am Pre-op orders written  Agree with above.  For Diatek catheter today.  Di Kindle. Edilia Bo, MD, FACS Beeper [phone] 09/15/2012

## 2012-09-15 NOTE — Anesthesia Postprocedure Evaluation (Signed)
Anesthesia Post Note  Patient: Nicolas Mccann  Procedure(s) Performed: Procedure(s) (LRB): INSERTION OF DIALYSIS CATHETER (N/A)  Anesthesia type: MAC  Patient location: PACU  Post pain: Pain level controlled  Post assessment: Patient's Cardiovascular Status Stable  Last Vitals:  Filed Vitals:   09/15/12 1515  BP: 135/79  Pulse: 83  Temp:   Resp: 16    Post vital signs: Reviewed and stable  Level of consciousness: alert  Complications: No apparent anesthesia complications

## 2012-09-15 NOTE — Op Note (Signed)
09/15/2012  PREOP DIAGNOSIS: Chronic kidney disease  POSTOP DIAGNOSIS: Chronic kidney disease  PROCEDURE: Ultrasound guided placement of right IJ Diatek catheter  SURGEON: Di Kindle. Edilia Bo, MD, FACS  ASSIST: none  ANESTHESIA: local with sedation   EBL: minimal  FINDINGS: patent right IJ  INDICATIONS: patient's AV fistula is not ready for access  TECHNIQUE: The patient was taken to the operating room and sedated by anesthesia. The neck and upper chest were prepped and draped in the usual sterile fashion. After the skin was anesthetized with 1% lidocaine, and under ultrasound guidance, the right IJ was cannulated and a guidewire introduced into the superior vena cava under fluoroscopic control. The tract over the wire was dilated and then the dilator and peel-away sheath were passed over the wire and the wire and dilator removed. The catheter was passed through the peel-away sheath and positioned in the right atrium. The exit site for the catheter was selected and the skin anesthetized between the 2 areas. The catheter was then brought through the tunnel, cut to the appropriate length, and the distal ports were attached. Both ports withdrew easily, were then flushed with heparinized saline and filled with concentrated heparin. The catheter was secured at its exit site with a 3-0 nylon suture. The IJ cannulation site was closed with a 4-0 subcuticular stitch. A sterile dressing was applied. The patient tolerated the procedure well and was transferred to the recovery room in stable condition. All needle and sponge counts were correct.  Waverly Ferrari, MD, FACS [facility] [place] [facility]  DATE OF OPERATION: 09/15/2012 DATE OF DICTATION: 09/15/2012

## 2012-09-15 NOTE — Anesthesia Preprocedure Evaluation (Addendum)
Anesthesia Evaluation  Patient identified by MRN, date of birth, ID band Patient awake    Reviewed: Allergy & Precautions, H&P , NPO status , Patient's Chart, lab work & pertinent test results, reviewed documented beta blocker date and time   Airway Mallampati: II TM Distance: >3 FB Neck ROM: full    Dental   Pulmonary neg pulmonary ROS,  breath sounds clear to auscultation        Cardiovascular hypertension, On Medications Rhythm:regular     Neuro/Psych negative neurological ROS  negative psych ROS   GI/Hepatic negative GI ROS, Neg liver ROS,   Endo/Other  diabetes, Oral Hypoglycemic AgentsMorbid obesity  Renal/GU ESRF and DialysisRenal disease  negative genitourinary   Musculoskeletal   Abdominal   Peds  Hematology negative hematology ROS (+)   Anesthesia Other Findings See surgeon's H&P   Reproductive/Obstetrics negative OB ROS                           Anesthesia Physical Anesthesia Plan  ASA: III  Anesthesia Plan: MAC   Post-op Pain Management:    Induction: Intravenous  Airway Management Planned: Simple Face Mask  Additional Equipment:   Intra-op Plan:   Post-operative Plan:   Informed Consent: I have reviewed the patients History and Physical, chart, labs and discussed the procedure including the risks, benefits and alternatives for the proposed anesthesia with the patient or authorized representative who has indicated his/her understanding and acceptance.   Dental Advisory Given  Plan Discussed with: CRNA and Surgeon  Anesthesia Plan Comments:         Anesthesia Quick Evaluation

## 2012-09-15 NOTE — Progress Notes (Signed)
AVF with poor thrill and bruit noted. Was able to initially cannulate with 17g needles with no problem, however arterial needle site bleeding and venous site would not flush after labs drawn. Dr Hyman Hopes notified, plan for central line placement and dialysis tomorrow.

## 2012-09-15 NOTE — Transfer of Care (Signed)
Immediate Anesthesia Transfer of Care Note  Patient: Nicolas Mccann  Procedure(s) Performed: Procedure(s) (LRB) with comments: INSERTION OF DIALYSIS CATHETER (N/A)  Patient Location: PACU  Anesthesia Type:MAC  Level of Consciousness: awake  Airway & Oxygen Therapy: Patient Spontanous Breathing and Patient connected to nasal cannula oxygen  Post-op Assessment: Report given to PACU RN and Post -op Vital signs reviewed and stable  Post vital signs: Reviewed and stable  Complications: No apparent anesthesia complications

## 2012-09-15 NOTE — Procedures (Signed)
I have seen and examined this patient and agree with the plan of care  Seen in dialysis.needle infiltration will need placement of permcath and  Fistulogram. Discussed with [facility].  Nicolas Mccann W 09/15/2012, 11:32 AM

## 2012-09-15 NOTE — H&P (Signed)
[facility] CONSULT NOTE 09/15/2012 DOB: [id number] MRN : [id number]   CC: ESRD with infiltrated left Brachiocephalic AVF - ? function Referring Physician: Elvis Coil, MD   History of Present Illness: Nicolas Mccann is a 66 y.o. male who had a left brachiocephalic AVF placed around 1998 per patient ( Dr Hyman Hopes states it may have been put in approx 2 years ago) This has never been used. Pt is now in need of HD. The fistula was not functional/accessible per attempt at HD this am despite thrill in AVF. Pt denies any steal symptoms in the left hand. Pt has very large arm.   Pt will need percath so that he may begin dialysis and poss fistulogram to evaluate fistula.   Per admission H&P: pt admitted with Hypertension, ESRD not on dialysis but has arm fistula, reports being short of breath over the last three months , which has worsened over the last few weeks, associated with productive cough with sputum, no subjective fevers, orthopnea and PND present. Leg edema worsening over the last few weeks, right > left. On arrival to ED he was found to be hypoxemic with oxygen sats in 70's, he was put on 6 lit of nasal canula oxygen with oxygen sats in high 80's. He is tachypnic with increased work of breathing and was put on BIPAP. CXR showed pulmonary edema and we were called to admit patient for acute respiratory failure secondary to fluid over load from ESRD.      Past Medical History   Diagnosis  Date   .  Renal failure     .  Diabetes mellitus without complication        No past surgical history on file.     ROS: [x]  Positive  [ ]  Denies      General: [ ]  Weight loss, [ ]  Fever, [ ]  chills Neurologic: [ ]  Dizziness, [ ]  Blackouts, [ ]  Seizure [ ]  Stroke, [ ]  "Mini stroke", [ ]  Slurred speech, [ ]  Temporary blindness; [ ]  weakness in arms or legs, [ ]  Hoarseness Cardiac: [ ]  Chest pain/pressure, [ ]  Shortness of breath at rest [ ]  Shortness of breath with  exertion, [ ]  Atrial fibrillation or irregular heartbeat Vascular: [ ]  Pain in legs with walking, [ ]  Pain in legs at rest, [ ]  Pain in legs at night,  [ ]  Non-healing ulcer, [ ]  Blood clot in vein/DVT,    Pulmonary: [ ]  Home oxygen, [ ]  Productive cough, [ ]  Coughing up blood, [ ]  Asthma,  [ ]  Wheezing Musculoskeletal:  [ ]  Arthritis, [ ]  Low back pain, [ ]  Joint pain Hematologic: [ ]  Easy Bruising, [ ]  Anemia; [ ]  Hepatitis Gastrointestinal: [ ]  Blood in stool, [ ]  Gastroesophageal Reflux/heartburn, [ ]  Trouble swallowing Urinary: [ x] chronic Kidney disease, [ ]  on HD - [ ]  MWF or [ ]  TTHS, [ ]  Burning with urination, [ ]  Difficulty urinating Skin: [ ]  Rashes, [ ]  Wounds Psychological: [ ]  Anxiety, [ ]  Depression   Social History History   Substance Use Topics   .  Smoking status:  Never Smoker    .  Smokeless tobacco:  Not on file   .  Alcohol Use:  No      Family History History reviewed. No pertinent family history.   No Known Allergies    Current Facility-Administered Medications   Medication  Dose  Route  Frequency  Provider  Last Rate  Last Dose   .  0.9 %  sodium chloride infusion   100 mL  Intravenous  PRN  Garnetta Buddy, MD         .  0.9 %  sodium chloride infusion   100 mL  Intravenous  PRN  Garnetta Buddy, MD         .  acetaminophen (TYLENOL) tablet 650 mg   650 mg  Oral  Q6H PRN  Kathlen Mody, MD     650 mg at 09/15/12 0518     Or   .  acetaminophen (TYLENOL) suppository 650 mg   650 mg  Rectal  Q6H PRN  Kathlen Mody, MD         .  darbepoetin (ARANESP) injection 60 mcg   60 mcg  Intravenous  Q Thu-HD  Dyke Maes, MD         .  enoxaparin (LOVENOX) injection 30 mg   30 mg  Subcutaneous  Q24H  Doug Sou, MD     30 mg at 09/14/12 2340   .  feeding supplement (NEPRO CARB STEADY) liquid 237 mL   237 mL  Oral  PRN  Garnetta Buddy, MD         .  ferric gluconate (NULECIT) 125 mg in sodium chloride 0.9 % 100 mL IVPB   125 mg  Intravenous  Q T,Th,Sa-HD   Garnetta Buddy, MD         .  heparin injection 1,000 Units   1,000 Units  Dialysis  PRN  Garnetta Buddy, MD         .  heparin injection 2,700 Units   20 Units/kg  Dialysis  PRN  Garnetta Buddy, MD         .  HYDROmorphone (DILAUDID) injection 1 mg   1 mg  Intravenous  Q4H PRN  Zannie Cove, MD     1 mg at 09/15/12 0956   .  insulin aspart (novoLOG) injection 0-5 Units   0-5 Units  Subcutaneous  QHS  Kathlen Mody, MD         .  insulin aspart (novoLOG) injection 0-9 Units   0-9 Units  Subcutaneous  TID WC  Kathlen Mody, MD     1 Units at 09/15/12 0925   .  lidocaine (XYLOCAINE) 1 % injection 5 mL   5 mL  Intradermal  PRN  Garnetta Buddy, MD         .  lidocaine-prilocaine (EMLA) cream 1 application   1 application  Topical  PRN  Garnetta Buddy, MD         .  multivitamin (RENA-VIT) tablet 1 tablet   1 tablet  Oral  Daily  Dyke Maes, MD     1 tablet at 09/15/12 [phone]   .  ondansetron (ZOFRAN) tablet 4 mg   4 mg  Oral  Q6H PRN  Kathlen Mody, MD           Or   .  ondansetron (ZOFRAN) injection 4 mg   4 mg  Intravenous  Q6H PRN  Kathlen Mody, MD         .  paricalcitol (ZEMPLAR) injection 2 mcg   2 mcg  Intravenous  3 times weekly  Dyke Maes, MD         .  pentafluoroprop-tetrafluoroeth Peggye Pitt) aerosol 1 application   1 application  Topical  PRN  Garnetta Buddy, MD         .  [  EXPIRED] pentafluoroprop-tetrafluoroeth (GEBAUERS) aerosol                  .  sevelamer (RENVELA) tablet 800 mg   800 mg  Oral  TID WC  Kathlen Mody, MD     800 mg at 09/15/12 0924   .  sodium chloride 0.9 % injection 3 mL   3 mL  Intravenous  Q12H  Kathlen Mody, MD     3 mL at 09/14/12 2340   .  [DISCONTINUED] calcitRIOL (ROCALTROL) capsule 0.5 mcg   0.5 mcg  Oral  Daily  Kathlen Mody, MD     0.5 mcg at 09/14/12 1114       Imaging: Dg Chest 2 View   09/14/2012  *RADIOLOGY REPORT*  Clinical Data: Follow-up edema.  Diabetes and hypertension. Dialysis patient  CHEST - 2 VIEW  Comparison: 09/13/2012   Findings: Cardiomegaly is identified and appears stable. There is persistent pulmonary vascular congestion, small bilateral pleural effusions and fissural fluid identified compatible with mild pulmonary edema.  This is unchanged in comparison with the prior exam.  No new focal infiltrates are identified.  Bony structures demonstrate some degenerative change of the lower thoracic spine.  IMPRESSION: Unchanged pulmonary vascular congestion and mild pulmonary edema pattern.   Original Report Authenticated By: Rhodia Albright, M.D.      Significant Diagnostic Studies: CBC Lab Results   Component  Value  Date     WBC  9.9  09/14/2012     HGB  7.2*  09/14/2012     HCT  22.8*  09/14/2012     MCV  101.8*  09/14/2012     PLT  214  09/14/2012      BMET    Component  Value  Date/Time     NA  142  09/15/2012 1020     K  4.6  09/15/2012 1020     CL  102  09/15/2012 1020     CO2  24  09/15/2012 1020     GLUCOSE  164*  09/15/2012 1020     BUN  95*  09/15/2012 1020     CREATININE  9.05*  09/15/2012 1020     CALCIUM  9.9  09/15/2012 1020     CALCIUM  9.2  01/13/2011 2249     GFRNONAA  5*  09/15/2012 1020     GFRAA  6*  09/15/2012 1020      COAG No results found for this basename: INR, PROTIME    No results found for this basename: PTT        Physical Examination BP Readings from Last 3 Encounters:   09/15/12  115/61   09/15/12  115/61    Temp Readings from Last 3 Encounters:   09/15/12  98.4 F (36.9 C) Oral   09/15/12  98.4 F (36.9 C) Oral    SpO2 Readings from Last 3 Encounters:   09/15/12  94%   09/15/12  94%    Pulse Readings from Last 3 Encounters:   09/15/12  84   09/15/12  84      General:  WDWN in NAD Gait: Normal HENT: WNL Pulmonary: normal non-labored breathing , without Rales, rhonchi,  wheezing Cardiac: RRR, without  Murmurs, rubs or gallops; Abdomen: soft, NT,  Skin: no rashes, ulcers noted Vascular Exam/Pulses:3+ radial pulses bilat LUE with ecchymosis and  tenderness over left B-C AVF Positive Bruit and Mod flow per doppler in AVF Pt has a large arm and this fistula  is deep and not palpable through the skin Extremities without ischemic changes, no Gangrene , no cellulitis; no open wounds;  Musculoskeletal: no muscle wasting or atrophy          Neurologic: A&O X 3; Appropriate Affect ;   SENSATION: normal; MOTOR FUNCTION: Pt has good and equal strength in all extremities - 5/5 Speech is fluent/normal   Non-Invasive Vascular Imaging: none   ASSESSMENT/PLAN: Nicolas Mccann is a 66 y.o. male with ESRD stage 5 who is now in need of HD. Left B-C AVF has a bruit and good doppler flow but is deep secondary to the size of this pt arm. Pt will need a permcath to begin HD and prob USG to check for branches and depth of fistula and/or fistulogram to assess anastomosis Will make pt NPO - Pt ate at 8am Pre-op orders written   Agree with above.   For Diatek catheter today.   Di Kindle. Edilia Bo, MD, FACS Beeper [phone] 09/15/2012    Revision History...     Date/Time User Action   09/15/2012 2:07 PM Chuck Hint, MD Sign   09/15/2012 11:31 AM Marlowe Shores, PA Sign  View Details Report   Routing History...     Date/Time From To Method   09/15/2012 2:07 PM Chuck Hint, MD Chuck Hint, MD In [facility]

## 2012-09-16 ENCOUNTER — Inpatient Hospital Stay (HOSPITAL_COMMUNITY): Payer: Medicare Other

## 2012-09-16 DIAGNOSIS — I10 Essential (primary) hypertension: Secondary | ICD-10-CM

## 2012-09-16 DIAGNOSIS — N19 Unspecified kidney failure: Secondary | ICD-10-CM

## 2012-09-16 LAB — TYPE AND SCREEN
ABO/RH(D): A POS
Unit division: 0
Unit division: 0

## 2012-09-16 LAB — RENAL FUNCTION PANEL
BUN: 95 mg/dL — ABNORMAL HIGH (ref 6–23)
Calcium: 10.2 mg/dL (ref 8.4–10.5)
Creatinine, Ser: 9.09 mg/dL — ABNORMAL HIGH (ref 0.50–1.35)
Glucose, Bld: 163 mg/dL — ABNORMAL HIGH (ref 70–99)
Phosphorus: 8.1 mg/dL — ABNORMAL HIGH (ref 2.3–4.6)
Sodium: 139 mEq/L (ref 135–145)

## 2012-09-16 LAB — GLUCOSE, CAPILLARY: Glucose-Capillary: 135 mg/dL — ABNORMAL HIGH (ref 70–99)

## 2012-09-16 LAB — CBC
HCT: 25.9 % — ABNORMAL LOW (ref 39.0–52.0)
Hemoglobin: 8.4 g/dL — ABNORMAL LOW (ref 13.0–17.0)
MCH: 32.6 pg (ref 26.0–34.0)
MCHC: 32.4 g/dL (ref 30.0–36.0)

## 2012-09-16 MED ORDER — COLCHICINE 0.6 MG PO TABS
0.6000 mg | ORAL_TABLET | Freq: Once | ORAL | Status: AC
  Administered 2012-09-16: 0.6 mg via ORAL
  Filled 2012-09-16: qty 1

## 2012-09-16 MED ORDER — DARBEPOETIN ALFA-POLYSORBATE 60 MCG/0.3ML IJ SOLN
INTRAMUSCULAR | Status: AC
  Administered 2012-09-16: 60 ug via INTRAVENOUS
  Filled 2012-09-16: qty 0.3

## 2012-09-16 MED ORDER — PARICALCITOL 5 MCG/ML IV SOLN
INTRAVENOUS | Status: AC
  Administered 2012-09-16: 2 ug via INTRAVENOUS
  Filled 2012-09-16: qty 1

## 2012-09-16 MED ORDER — PREDNISONE 50 MG PO TABS
50.0000 mg | ORAL_TABLET | Freq: Every day | ORAL | Status: DC
  Administered 2012-09-16 – 2012-09-17 (×2): 50 mg via ORAL
  Filled 2012-09-16 (×3): qty 1

## 2012-09-16 MED ORDER — DARBEPOETIN ALFA-POLYSORBATE 60 MCG/0.3ML IJ SOLN
60.0000 ug | INTRAMUSCULAR | Status: DC
  Administered 2012-09-16: 60 ug via INTRAVENOUS

## 2012-09-16 NOTE — Progress Notes (Signed)
Order is prn.

## 2012-09-16 NOTE — Progress Notes (Signed)
PT Cancellation Note  Patient Details Name: Nicolas Mccann MRN: [id number] DOB: 12-14-1945   Cancelled Treatment:    Reason Eval/Treat Not Completed: Patient at procedure or test/unavailable.  Patient in HD.  Will return in am for PT evaluation.   Vena Austria 09/16/2012, 2:56 PM [phone]

## 2012-09-16 NOTE — Progress Notes (Signed)
[facility] Progress Note    Nicolas Mccann BJY:[id number] DOB: 08-30-1946 DOA: 09/12/2012 PCP: Sheila Oats, MD  Brief narrative: 66 year old male with known hypertension and chronic kidney disease. Had not yet started dialysis but has a fistula in place. Endorses progressive shortness of breath for 3 months that has worsened over the past few weeks. Also increasing lower extremity edema. Presented to the emergency department because of hypoxemia and shortness of breath with O2 sats in the 70s. Despite high flow oxygen was only able to maintain saturations in the high 80s and continue with persistent tachypnea and labored respiratory effort so a BiPAP was utilized. His chest x-ray in the ER did demonstrate pulmonary edema. Pulmonary medicine was consulted as well as renal team. Concerns patient may are emergent hemodialysis. Patient apparently endorsed he had previously been receiving healthcare [facility] but has been unable to see a doctor for at least one month and has been out of his medications for at least that long. 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, GFR less than 15 ml/min New HD *Appreciate nephrology assistance, Daily HD so far Access issues, s/p HD catheter and fistulogram  Diabetes mellitus with renal complications *Was supposed to be on Glucotrol XL at home *Hemoglobin A1c 5.9, CBG stable *continue SSI  Leukocytosis *Reactive secondary to stressors of acute respiratory failure - now resolved  Anemia in chronic renal disease *Management per renal Transfused 1 unit so far aranesp with HD and IV  Iron Anemia panel mild iron deficiency only  HTN (hypertension) *Blood pressure currently well-controlled with dialysis  L knee pain: OA vs early Gout Colchicine, Prednisone for today, ambulate, PT   DVT prophylaxis: Lovenox  Code Status: Full Family Communication: Spoke with patient Disposition Plan: pending, PT/OT eval  Consultants: Nephrology Pulmonary medicine  Procedures: None  Antibiotics: None  HPI/Subjective: C/o L knee pain, coughing , breathing ok   Objective: Blood pressure 120/77, pulse 79, temperature 97.5 F (36.4 C), temperature source Oral, resp. rate 20, height 6\' 2"  (1.88 m), weight 136.3 kg (300 lb 7.8 oz), SpO2 98.00%.  Intake/Output Summary (Last 24 hours) at 09/16/12 1036 Last data filed at 09/16/12 0900  Gross per 24 hour  Intake    280 ml  Output   1050 ml  Net   -770 ml     Exam: General: AAOx3, no distress Lungs: Clear to auscultation bilaterally except for fine bibasilar crackles, nasal cannula oxygen at 5 L per minute with saturations 94% Cardiovascular: Regular rate and rhythm without murmur gallop or rub normal S1 and S2 Abdomen: Nontender, nondistended, soft, bowel sounds positive, no rebound, no ascites, no appreciable mass Musculoskeletal: No significant cyanosis, clubbing of bilateral lower extremities Neurological: Alert and oriented x3 moves all extremities x4, exam non-focal Ext: 1 plus edema  Data Reviewed: Basic Metabolic Panel:  Lab 09/15/12 0865 09/15/12 1020 09/14/12 1655 09/13/12 1330 09/13/12 0605 09/13/12 0325 09/12/12 1754  NA 142 142 141 145 -- 143 --  K 4.6 4.6 4.6 4.6 -- 4.5 --  CL 104 102 101 108 -- 106 --  CO2 26 24 25 22  -- 22 --  GLUCOSE 169* 164* 141* 120* -- 120* --  BUN 94* 95* 93* 119* -- 116* --  CREATININE 9.24* 9.05* 8.70* 10.77* -- 10.55* --  CALCIUM 10.0 9.9 9.7 9.7 -- 9.8 --  MG -- -- -- -- 2.6* -- 2.9*  PHOS -- 7.6* 7.4* 7.9* -- -- 9.2*   Liver Function Tests:  Lab 09/15/12 1020  09/14/12 1655 09/13/12 1330 09/12/12 1329  AST -- -- -- 17  ALT -- -- -- 17  ALKPHOS -- -- -- 59  BILITOT -- -- -- 0.3  PROT -- -- -- 8.2  ALBUMIN 3.1* 3.0* 3.1* 3.7   CBC:  Lab 09/15/12 1752 09/14/12 1631 09/13/12 1330 09/13/12 0325 09/12/12 1754 09/12/12 1329  WBC 10.8* 9.9 9.2 9.8 11.1* --  NEUTROABS -- -- -- -- -- 8.4*  HGB 8.6* 7.2* 7.1* 7.0* 8.3* --  HCT 27.5* 22.8* 22.1* 22.0* 25.8* --  MCV 101.5* 101.8* 100.9* 101.9* 101.2* --  PLT 197 214 189 183 233 --   Cardiac Enzymes:  Lab 09/13/12 0605 09/12/12 2254 09/12/12 1755  CKTOTAL -- -- --  CKMB -- -- --  CKMBINDEX -- -- --  TROPONINI 0.42* 0.57* 0.32*   BNP (last 3 results)  Basename 09/12/12 1329  PROBNP 3528.0*   CBG:  Lab 09/16/12 0729 09/15/12 2024 09/15/12 1348 09/15/12 1159 09/15/12 0759  GLUCAP 135* 135* 140* 112* 132*    Recent Results (from the past 240 hour(s))  MRSA PCR SCREENING     Status: Normal   Collection Time   09/12/12  6:50 PM      Component Value Range Status Comment   MRSA by PCR NEGATIVE  NEGATIVE Final         Zannie Cove, MD [facility]

## 2012-09-16 NOTE — Progress Notes (Signed)
VASCULAR PROGRESS NOTE  SUBJECTIVE: comfortable. No specific complaints.  PHYSICAL EXAM: Filed Vitals:   09/15/12 1600 09/15/12 1631 09/15/12 2017 09/16/12 0444  BP: 128/66 122/62 130/82 150/91  Pulse: 87 83 90 91  Temp: 96.9 F (36.1 C) 97.5 F (36.4 C) 98.9 F (37.2 C) 97.9 F (36.6 C)  TempSrc:  Oral Oral Oral  Resp: 19 19 20 20   Height:      Weight:      SpO2: 95% 99% 95% 98%   Catheter site looks fine. Left upper arm AV fistula has a bruit and thrill. Persistent ecchymosis.  LABS: Lab Results  Component Value Date   WBC 10.8* 09/15/2012   HGB 8.6* 09/15/2012   HCT 27.5* 09/15/2012   MCV 101.5* 09/15/2012   PLT 197 09/15/2012   Lab Results  Component Value Date   CREATININE 9.24* 09/15/2012   No results found for this basename: INR, PROTIME   CBG (last 3)   Basename 09/15/12 2024 09/15/12 1348 09/15/12 1159  GLUCAP 135* 140* 112*   ASSESSMENT/PLAN: 1. 1 Day Post-Op s/p: Diatek catheter placement 2. Duplex of left upper arm fistula is pending. The fistula still is functional. He appears to have had an infiltrate. We'll follow up results of duplex scan. Based on this he may need a fistulogram in the future but would wait for ecchymosis and swelling to resolve before cannulating the fistula. In the meantime would use his catheter for dialysis.  Waverly Ferrari, MD, FACS Beeper: [phone] 09/16/2012

## 2012-09-16 NOTE — ED Provider Notes (Signed)
Medical screening examination/treatment/procedure(s) were conducted as a shared visit with non-physician practitioner(s) and myself.  I personally evaluated the patient during the encounter.  At bedside on his arrival from triage.  Noted respiratory insufficiency.  Note renal failure and clinical and radiographic evidence of CHF.  Secondary to stable VS but a concerning initial presentation, admitted to the step-down unit.  Tobin Chad, MD 09/16/12 [id number]

## 2012-09-16 NOTE — Progress Notes (Signed)
[facility] ROUNDING NOTE   Subjective:   Interval History: symptomatically improved  Objective:  Vital signs in last 24 hours:  Temp:  [96.8 F (36 C)-98.9 F (37.2 C)] 97.5 F (36.4 C) (11/09 0906) Pulse Rate:  [78-91] 79  (11/09 0906) Resp:  [13-20] 20  (11/09 0906) BP: (109-151)/(62-91) 120/77 mmHg (11/09 0906) SpO2:  [95 %-100 %] 98 % (11/09 0906)  Weight change: 0.3 kg (10.6 oz) Filed Weights   09/13/12 2011 09/14/12 1532 09/15/12 0948  Weight: 136.2 kg (300 lb 4.3 oz) 136 kg (299 lb 13.2 oz) 136.3 kg (300 lb 7.8 oz)    Intake/Output: I/O last 3 completed shifts: In: 1225 [P.O.:300; I.V.:350; Blood:575] Out: 1875 [Urine:1875]   Intake/Output this shift:  Total I/O In: 120 [P.O.:120] Out: 400 [Urine:400]  CVS- RRR  RS- diminidshed air entry  ABD- BS present soft non-distended  EXT- 2+ edema    Basic Metabolic Panel:  Lab 09/15/12 4098 09/15/12 1020 09/14/12 1655 09/13/12 1330 09/13/12 0605 09/13/12 0325 09/12/12 1754  NA 142 142 141 145 -- 143 --  K 4.6 4.6 4.6 4.6 -- 4.5 --  CL 104 102 101 108 -- 106 --  CO2 26 24 25 22  -- 22 --  GLUCOSE 169* 164* 141* 120* -- 120* --  BUN 94* 95* 93* 119* -- 116* --  CREATININE 9.24* 9.05* 8.70* 10.77* -- 10.55* --  CALCIUM 10.0 9.9 9.7 -- -- -- --  MG -- -- -- -- 2.6* -- 2.9*  PHOS -- 7.6* 7.4* 7.9* -- -- 9.2*    Liver Function Tests:  Lab 09/15/12 1020 09/14/12 1655 09/13/12 1330 09/12/12 1329  AST -- -- -- 17  ALT -- -- -- 17  ALKPHOS -- -- -- 59  BILITOT -- -- -- 0.3  PROT -- -- -- 8.2  ALBUMIN 3.1* 3.0* 3.1* 3.7   No results found for this basename: LIPASE:5,AMYLASE:5 in the last 168 hours No results found for this basename: AMMONIA:3 in the last 168 hours  CBC:  Lab 09/15/12 1752 09/14/12 1631 09/13/12 1330 09/13/12 0325 09/12/12 1754 09/12/12 1329  WBC 10.8* 9.9 9.2 9.8 11.1* --  NEUTROABS -- -- -- -- -- 8.4*  HGB 8.6* 7.2* 7.1* 7.0* 8.3* --  HCT 27.5* 22.8* 22.1* 22.0* 25.8* --    MCV 101.5* 101.8* 100.9* 101.9* 101.2* --  PLT 197 214 189 183 233 --    Cardiac Enzymes:  Lab 09/13/12 0605 09/12/12 2254 09/12/12 1755  CKTOTAL -- -- --  CKMB -- -- --  CKMBINDEX -- -- --  TROPONINI 0.42* 0.57* 0.32*    BNP: No components found with this basename: POCBNP:5  CBG:  Lab 09/16/12 0729 09/15/12 2024 09/15/12 1348 09/15/12 1159 09/15/12 0759  GLUCAP 135* 135* 140* 112* 132*    Microbiology: Results for orders placed during the hospital encounter of 09/12/12  MRSA PCR SCREENING     Status: Normal   Collection Time   09/12/12  6:50 PM      Component Value Range Status Comment   MRSA by PCR NEGATIVE  NEGATIVE Final     Coagulation Studies: No results found for this basename: LABPROT:5,INR:5 in the last 72 hours  Urinalysis: No results found for this basename: COLORURINE:2,APPERANCEUR:2,LABSPEC:2,PHURINE:2,GLUCOSEU:2,HGBUR:2,BILIRUBINUR:2,KETONESUR:2,PROTEINUR:2,UROBILINOGEN:2,NITRITE:2,LEUKOCYTESUR:2 in the last 72 hours    Imaging: Dg Chest 2 View  09/14/2012  *RADIOLOGY REPORT*  Clinical Data: Follow-up edema.  Diabetes and hypertension. Dialysis patient  CHEST - 2 VIEW  Comparison: 09/13/2012  Findings: Cardiomegaly is identified and appears  stable. There is persistent pulmonary vascular congestion, small bilateral pleural effusions and fissural fluid identified compatible with mild pulmonary edema.  This is unchanged in comparison with the prior exam.  No new focal infiltrates are identified.  Bony structures demonstrate some degenerative change of the lower thoracic spine.  IMPRESSION: Unchanged pulmonary vascular congestion and mild pulmonary edema pattern.   Original Report Authenticated By: Rhodia Albright, M.D.    Dg Chest Port 1 View  09/15/2012  *RADIOLOGY REPORT*  Clinical Data: Central line placement  PORTABLE CHEST - 1 VIEW  Comparison: Chest radiograph 09/14/2012  Findings: There is interval placement of large bore right central venous line.  Split  catheter tips in the distal SVC.  No pneumothorax.  Stable enlarged heart silhouette.  There is central venous congestion and mild pulmonary edema not changed from prior.  IMPRESSION: 1.  Interval placement right central venous line without complication. 2.  Cardiomegaly and mild pulmonary edema.   Original Report Authenticated By: [name], M.D.      Medications:        . [COMPLETED] cefUROXime (ZINACEF)  IV  1.5 g Intravenous On Call to OR  . darbepoetin (ARANESP) injection - DIALYSIS  60 mcg Intravenous Q Thu-HD  . enoxaparin (LOVENOX) injection  30 mg Subcutaneous Q24H  . ferric gluconate (FERRLECIT/NULECIT) IV  125 mg Intravenous Q T,Th,Sa-HD  . insulin aspart  0-5 Units Subcutaneous QHS  . insulin aspart  0-9 Units Subcutaneous TID WC  . multivitamin  1 tablet Oral Daily  . paricalcitol  2 mcg Intravenous 3 times weekly  . sevelamer  800 mg Oral TID WC  . sodium chloride  3 mL Intravenous Q12H   sodium chloride, sodium chloride, acetaminophen, acetaminophen, feeding supplement (NEPRO CARB STEADY), fentaNYL, heparin, heparin, HYDROmorphone (DILAUDID) injection, lidocaine, lidocaine-prilocaine, [EXPIRED] metoCLOPramide, ondansetron (ZOFRAN) IV, ondansetron, [EXPIRED] oxyCODONE, oxyCODONE, [EXPIRED] oxyCODONE, pentafluoroprop-tetrafluoroeth, [DISCONTINUED] 0.9 % irrigation (POUR BTL), [DISCONTINUED] heparin 6000 unit irrigation [DISCONTINUED] heparin, [DISCONTINUED] lidocaine-EPINEPHrine  Assessment/ Plan:  Advanced CKD 5. Plan dialysis today through permacath Anemia iron stores low will replete and continue aranesp  Bones continue binders and vitamin D  Access Using permcath VVS following  Nutrition Renal vitamins daily  Volume massive swelling on admission improved   Much better plan dialysis today     LOS: 4 Latisia Hilaire W @TODAY @10 :12 AM

## 2012-09-17 LAB — GLUCOSE, CAPILLARY
Glucose-Capillary: 107 mg/dL — ABNORMAL HIGH (ref 70–99)
Glucose-Capillary: 161 mg/dL — ABNORMAL HIGH (ref 70–99)

## 2012-09-17 MED ORDER — HEPARIN SODIUM (PORCINE) 1000 UNIT/ML DIALYSIS
20.0000 [IU]/kg | INTRAMUSCULAR | Status: DC | PRN
  Administered 2012-09-18: 2700 [IU] via INTRAVENOUS_CENTRAL
  Filled 2012-09-17: qty 3

## 2012-09-17 MED ORDER — SODIUM CHLORIDE 0.9 % IV SOLN
125.0000 mg | INTRAVENOUS | Status: DC
  Administered 2012-09-18: 125 mg via INTRAVENOUS
  Filled 2012-09-17 (×2): qty 10

## 2012-09-17 MED ORDER — GUAIFENESIN 100 MG/5ML PO SOLN
5.0000 mL | ORAL | Status: DC | PRN
  Administered 2012-09-17 – 2012-09-19 (×3): 100 mg via ORAL
  Filled 2012-09-17 (×3): qty 5

## 2012-09-17 MED ORDER — DOCUSATE SODIUM 100 MG PO CAPS
100.0000 mg | ORAL_CAPSULE | Freq: Two times a day (BID) | ORAL | Status: DC
  Administered 2012-09-17 – 2012-09-19 (×4): 100 mg via ORAL
  Filled 2012-09-17 (×4): qty 1

## 2012-09-17 MED ORDER — COLCHICINE 0.6 MG PO TABS
0.6000 mg | ORAL_TABLET | Freq: Once | ORAL | Status: AC
  Administered 2012-09-17: 0.6 mg via ORAL
  Filled 2012-09-17: qty 1

## 2012-09-17 MED ORDER — DARBEPOETIN ALFA-POLYSORBATE 60 MCG/0.3ML IJ SOLN: 60.0000 ug | INTRAMUSCULAR | Status: DC

## 2012-09-17 NOTE — Progress Notes (Signed)
[facility] Progress Note    Nicolas Mccann:[id number] DOB: 27-Jun-1946 DOA: 09/12/2012 PCP: Sheila Oats, MD  Brief narrative: 66 year old male with known hypertension and chronic kidney disease. Had not yet started dialysis but has a fistula in place. Endorses progressive shortness of breath for 3 months that has worsened over the past few weeks. Also increasing lower extremity edema. Presented to the emergency department because of hypoxemia and shortness of breath with O2 sats in the 70s. Despite high flow oxygen was only able to maintain saturations in the high 80s and continue with persistent tachypnea and labored respiratory effort so a BiPAP was utilized. His chest x-ray in the ER did demonstrate pulmonary edema. Pulmonary medicine was consulted as well as renal team. Concerns patient may are emergent hemodialysis. Patient apparently endorsed he had previously been receiving healthcare [facility] but has been unable to see a doctor for at least one month and has been out of his medications for at least that long. 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, GFR less than 15 ml/min New HD *Appreciate nephrology assistance, Daily HD so far Access issues, s/p HD catheter and fistulogram  Diabetes mellitus with renal complications *Was supposed to be on Glucotrol XL at home *Hemoglobin A1c 5.9, CBG stable *continue SSI  Leukocytosis *Reactive secondary to stressors of acute respiratory failure - now resolved  Anemia in chronic renal disease *Management per renal Transfused 1 unit so far aranesp with HD and IV  Iron Anemia panel mild iron deficiency only  HTN (hypertension) *Blood pressure currently well-controlled with dialysis  L knee pain: OA vs early Gout: improved with Colchicine, Prednisone , will give 1 more dose today, ambulate, PT   DVT prophylaxis: Lovenox  Code Status: Full Family Communication: Spoke with patient Disposition Plan: pending, PT/OT eval  Consultants: Nephrology Pulmonary medicine  Procedures: None  Antibiotics: None  HPI/Subjective: L knee pain much improved, coughing , breathing ok   Objective: Blood pressure 123/76, pulse 65, temperature 97.5 F (36.4 C), temperature source Oral, resp. rate 16, height 6\' 2"  (1.88 m), weight 134.5 kg (296 lb 8.3 oz), SpO2 91.00%.  Intake/Output Summary (Last 24 hours) at 09/17/12 1211 Last data filed at 09/17/12 0900  Gross per 24 hour  Intake    720 ml  Output   4500 ml  Net  -3780 ml     Exam: General: AAOx3, no distress Lungs: Clear to auscultation bilaterally except for fine bibasilar crackles, nasal cannula oxygen at 5 L per minute with saturations 94% Cardiovascular: Regular rate and rhythm without murmur gallop or rub normal S1 and S2 Abdomen: Nontender, nondistended, soft, bowel sounds positive, no rebound, no ascites, no appreciable mass Musculoskeletal: No significant cyanosis, clubbing of bilateral lower extremities Neurological: Alert and oriented x3 moves all extremities x4, exam non-focal Ext: 1 plus edema  Data Reviewed: Basic Metabolic Panel:  Lab 09/16/12 9562 09/15/12 1752 09/15/12 1020 09/14/12 1655 09/13/12 1330 09/13/12 0605 09/12/12 1754  NA 139 142 142 141 145 -- --  K 4.5 4.6 4.6 4.6 4.6 -- --  CL 101 104 102 101 108 -- --  CO2 24 26 24 25 22  -- --  GLUCOSE 163*  169* 164* 141* 120* -- --  BUN 95* 94* 95* 93* 119* -- --  CREATININE 9.09* 9.24* 9.05* 8.70* 10.77* -- --  CALCIUM 10.2 10.0 9.9 9.7 9.7 -- --  MG -- -- -- -- -- 2.6* 2.9*  PHOS 8.1* -- 7.6* 7.4* 7.9* -- 9.2*   Liver  Function Tests:  Lab 09/16/12 1219 09/15/12 1020 09/14/12 1655 09/13/12 1330 09/12/12 1329  AST -- -- -- -- 17  ALT -- -- -- -- 17  ALKPHOS -- -- -- -- 59  BILITOT -- -- -- -- 0.3  PROT -- -- -- -- 8.2  ALBUMIN 3.1* 3.1* 3.0* 3.1* 3.7   CBC:  Lab 09/16/12 1219 09/15/12 1752 09/14/12 1631 09/13/12 1330 09/13/12 0325 09/12/12 1329  WBC 11.8* 10.8* 9.9 9.2 9.8 --  NEUTROABS -- -- -- -- -- 8.4*  HGB 8.4* 8.6* 7.2* 7.1* 7.0* --  HCT 25.9* 27.5* 22.8* 22.1* 22.0* --  MCV 100.4* 101.5* 101.8* 100.9* 101.9* --  PLT 211 197 214 189 183 --   Cardiac Enzymes:  Lab 09/13/12 0605 09/12/12 2254 09/12/12 1755  CKTOTAL -- -- --  CKMB -- -- --  CKMBINDEX -- -- --  TROPONINI 0.42* 0.57* 0.32*   BNP (last 3 results)  Basename 09/12/12 1329  PROBNP 3528.0*   CBG:  Lab 09/16/12 2006 09/16/12 1653 09/16/12 0729 09/15/12 2024 09/15/12 1348  GLUCAP 161* 107* 135* 135* 140*    Recent Results (from the past 240 hour(s))  MRSA PCR SCREENING     Status: Normal   Collection Time   09/12/12  6:50 PM      Component Value Range Status Comment   MRSA by PCR NEGATIVE  NEGATIVE Final         Zannie Cove, MD [facility]  \

## 2012-09-17 NOTE — Progress Notes (Signed)
Pt just had an 8 beat run V Tach on telemetry. Pt resting comfortably in the chair, eating dinner, with no complaints of chest pain or shortness of breath. Pt asymptomatic. Will continue to monitor. MD made aware.

## 2012-09-17 NOTE — Progress Notes (Signed)
Occupational Therapy Evaluation Patient Details Name: Nicolas Mccann MRN: [id number] DOB: 12-11-1945 Today's Date: 09/17/2012 Time: 4098-1191 OT Time Calculation (min): 25 min  OT Assessment / Plan / Recommendation Clinical Impression  67 yo with ESRD admitted with increased SOB. Pt with ARF secondary to pulmonary edema. Pt will benefit from skilled OT services to max independence and safety with ADL and functional mobility for ADL to facilitate D/C home with HHOT. Pt will need RW and wide BSC.     OT Assessment  Patient needs continued OT Services    Follow Up Recommendations  Home health OT    Barriers to Discharge None pt has adult grandchildren that live in the home and can assist  Equipment Recommendations  Rolling walker with 5" wheels;3 in 1 bedside comode;Other (comment) (wide BSC)    Recommendations for Other Services  Palliative Care for pt education  Frequency  Min 2X/week    Precautions / Restrictions Precautions Precautions: Fall Precaution Comments: L knee "buckles"   Pertinent Vitals/Pain No c/o pain O2 sats 87 RA on entry to room. O2 2L 94 HR 115    ADL  Grooming: Set up Where Assessed - Grooming: Unsupported sitting Upper Body Bathing: Set up Where Assessed - Upper Body Bathing: Unsupported sitting Lower Body Bathing: Minimal assistance Where Assessed - Lower Body Bathing: Supported sit to stand Upper Body Dressing: Set up;Supervision/safety Where Assessed - Upper Body Dressing: Unsupported sitting Lower Body Dressing: Minimal assistance Where Assessed - Lower Body Dressing: Supported sit to stand Toilet Transfer: Minimal assistance Toilet Transfer Method:  (bed - chair) Toileting - Clothing Manipulation and Hygiene: Min guard Where Assessed - Glass blower/designer Manipulation and Hygiene: Standing Equipment Used: Gait belt;Rolling walker Transfers/Ambulation Related to ADLs: minA ADL Comments: limited by fatigue    OT Diagnosis: Generalized weakness    OT Problem List: Decreased strength;Decreased activity tolerance;Decreased knowledge of use of DME or AE;Cardiopulmonary status limiting activity;Obesity;Pain;Increased edema OT Treatment Interventions: Self-care/ADL training;Therapeutic exercise;Energy conservation;DME and/or AE instruction;Therapeutic activities;Patient/family education   OT Goals Acute Rehab OT Goals OT Goal Formulation: With patient Time For Goal Achievement: 10/01/12 Potential to Achieve Goals: Good ADL Goals Pt Will Perform Lower Body Bathing: with supervision;Sit to stand from chair;Unsupported;with adaptive equipment;Other (comment);Sitting at sink;Standing at sink ADL Goal: Lower Body Bathing - Progress: Goal set today Pt Will Perform Lower Body Dressing: with supervision;Sit to stand from chair;Unsupported;with adaptive equipment ADL Goal: Lower Body Dressing - Progress: Goal set today Pt Will Transfer to Toilet: with modified independence;with DME;Ambulation ADL Goal: Toilet Transfer - Progress: Goal set today Pt Will Perform Toileting - Clothing Manipulation: with modified independence;Sitting on 3-in-1 or toilet;Standing ADL Goal: Toileting - Clothing Manipulation - Progress: Goal set today Pt Will Perform Toileting - Hygiene: with modified independence;Sit to stand from 3-in-1/toilet ADL Goal: Toileting - Hygiene - Progress: Goal set today Additional ADL Goal #1: Pt will complete functional mobility @ RW level during ADL task with S. ADL Goal: Additional Goal #1 - Progress: Goal set today Additional ADL Goal #2: Pt will verbalize 2 E conservaiton techniques to use during ADL task. ADL Goal: Additional Goal #2 - Progress: Goal set today Arm Goals Pt Will Complete Theraband Exer: Independently;to increase strength;Bilateral upper extremities;2 sets;Level 2 Theraband Arm Goal: Theraband Exercises - Progress: Goal set today  Visit Information  Last OT Received On: 09/17/12 Assistance Needed: +1     Subjective Data      Prior Functioning     Home Living Lives With: Spouse;Family Available Help  at Discharge: Family Type of Home: House Home Access: Stairs to enter;Ramped entrance Entrance Stairs-Number of Steps: 3 Entrance Stairs-Rails: Can reach both Home Layout: One level Bathroom Shower/Tub: Walk-in Contractor: Handicapped height Bathroom Accessibility: Yes How Accessible: Accessible via walker Home Adaptive Equipment: None Prior Function Level of Independence: Independent Able to Take Stairs?: Yes Driving: Yes Vocation: Retired Musician: No difficulties Dominant Hand: Right         Vision/Perception  WFL. Glasses for reading   Cognition  Overall Cognitive Status: Appears within functional limits for tasks assessed/performed Arousal/Alertness: Awake/alert Orientation Level: Appears intact for tasks assessed Behavior During Session: [facility] for tasks performed    Extremity/Trunk Assessment Right Upper Extremity Assessment RUE ROM/Strength/Tone: WFL for tasks assessed RUE Sensation: WFL - Light Touch;WFL - Proprioception RUE Coordination: WFL - gross/fine motor Left Upper Extremity Assessment LUE ROM/Strength/Tone: WFL for tasks assessed LUE Sensation: WFL - Light Touch;WFL - Proprioception LUE Coordination: WFL - gross/fine motor Right Lower Extremity Assessment RLE ROM/Strength/Tone: Deficits RLE ROM/Strength/Tone Deficits: grossly weakened requiring assist to stand and walk RLE Sensation:  (c/o numbness B feet) Left Lower Extremity Assessment LLE ROM/Strength/Tone: Deficits LLE ROM/Strength/Tone Deficits: grossly weakened requiring assist to stand and walk Trunk Assessment Trunk Assessment: Normal     Mobility Bed Mobility Bed Mobility: Not assessed Transfers Transfers: Sit to Stand;Stand to Sit Sit to Stand: 4: Min assist;With upper extremity assist;From chair/3-in-1 Stand to Sit: 4: Min assist;With upper  extremity assist;To chair/3-in-1 Details for Transfer Assistance: requires increased time.      Shoulder Instructions     Exercise  general BUE theraband   Balance  min A   End of Session OT - End of Session Equipment Utilized During Treatment: Gait belt Activity Tolerance: Patient limited by fatigue Patient left: in chair;with call bell/phone within reach Nurse Communication: Other (comment) (pt's concern oer medication)  GO     Halo Shevlin,[name] 09/17/2012, 6:22 PM [facility], OTR/L  [phone] 09/17/2012

## 2012-09-17 NOTE — Evaluation (Signed)
Physical Therapy Evaluation Patient Details Name: Nicolas Mccann MRN: [id number] DOB: 11/06/1946 Today's Date: 09/17/2012 Time: 1445-1510 PT Time Calculation (min): 25 min  PT Assessment / Plan / Recommendation Clinical Impression  66 yo patient presents with severely impaired functional mobility compared to baseline.  Unable to stand or walk without assistive device.  Poor cardiorespriatory endurance limited to <25 feet.  Has family at home but only intermittent assistance.  Will need to reassess function in 1-2 days, however would recommend CONSIDER SNF for post acute rehab if patient does not perk up in the next few days.  Recommend SW for SNF as back-up plan if patient is unable to d/c home with [facility].  Will follow acutely as described below.    PT Assessment  Patient needs continued PT services    Follow Up Recommendations  Home health PT    Does the patient have the potential to tolerate intense rehabilitation      Barriers to Discharge Decreased caregiver support intermittent assist     Equipment Recommendations  Rolling walker with 5" wheels    Recommendations for Other Services     Frequency Min 3X/week    Precautions / Restrictions Precautions Precautions: Fall   Pertinent Vitals/Pain No pain reported      Mobility  Bed Mobility Bed Mobility: Not assessed Transfers Transfers: Sit to Stand;Stand to Sit Sit to Stand: 4: Min assist;With upper extremity assist;With armrests;From chair/3-in-1 Stand to Sit: 4: Min assist;With upper extremity assist;With armrests;To chair/3-in-1 Details for Transfer Assistance: physical assist and cues for safest technique.  Weak legs = instabity upon standing, and required use of device for external support Ambulation/Gait Ambulation/Gait Assistance: 4: Min guard Ambulation Distance (Feet): 20 Feet Assistive device: Rolling walker Ambulation/Gait Assistance Details: cues for safest use of RW, proximity, self-monitoring for  endurance Gait velocity: decreased General Gait Details: normally ambulates unassisted, now requires use of RW for stability and support.  Limited endurance to <25 feet with marked increase work of breath upon return to chair. Stairs: No Wheelchair Mobility Wheelchair Mobility: No    Shoulder Instructions     Exercises     PT Diagnosis: Difficulty walking;Generalized weakness  PT Problem List: Decreased strength;Decreased activity tolerance;Decreased mobility;Cardiopulmonary status limiting activity;Obesity PT Treatment Interventions: DME instruction;Gait training;Stair training;Functional mobility training;Therapeutic activities   PT Goals Acute Rehab PT Goals PT Goal Formulation: With patient Time For Goal Achievement: 10/01/12 Potential to Achieve Goals: Good Pt will go Supine/Side to Sit: Independently PT Goal: Supine/Side to Sit - Progress: Goal set today Pt will go Sit to Stand: with supervision PT Goal: Sit to Stand - Progress: Goal set today Pt will go Stand to Sit: with supervision PT Goal: Stand to Sit - Progress: Goal set today Pt will Ambulate: 51 - 150 feet;with supervision;with least restrictive assistive device PT Goal: Ambulate - Progress: Goal set today Pt will Go Up / Down Stairs: 3-5 stairs;with rail(s);with supervision PT Goal: Up/Down Stairs - Progress: Goal set today  Visit Information  Last PT Received On: 09/17/12 Assistance Needed: +1    Subjective Data  Subjective: My legs still feel pretty wobbly Patient Stated Goal: go home    Prior Functioning  Home Living Lives With: Spouse;Family Available Help at Discharge: Family Type of Home: House Home Access: Stairs to enter;Ramped entrance Entrance Stairs-Number of Steps: 3 Entrance Stairs-Rails: Can reach both Home Layout: One level Home Adaptive Equipment: None Prior Function Level of Independence: Independent Able to Take Stairs?: Yes Driving: Yes Communication Communication: No  difficulties    Cognition  Overall Cognitive Status: Appears within functional limits for tasks assessed/performed Arousal/Alertness: Awake/alert Orientation Level: Appears intact for tasks assessed Behavior During Session: [facility] for tasks performed    Extremity/Trunk Assessment Right Upper Extremity Assessment RUE ROM/Strength/Tone: [facility] for tasks assessed Left Upper Extremity Assessment LUE ROM/Strength/Tone: WFL for tasks assessed Right Lower Extremity Assessment RLE ROM/Strength/Tone: Deficits RLE ROM/Strength/Tone Deficits: grossly weakened requiring assist to stand and walk Left Lower Extremity Assessment LLE ROM/Strength/Tone: Deficits LLE ROM/Strength/Tone Deficits: grossly weakened requiring assist to stand and walk Trunk Assessment Trunk Assessment: Normal   Balance    End of Session PT - End of Session Activity Tolerance: Patient limited by fatigue Patient left: in chair;with call bell/phone within reach Nurse Communication: Mobility status;Precautions  GP     Dennis Bast 09/17/2012, 4:39 PM

## 2012-09-17 NOTE — Progress Notes (Signed)
VASCULAR PROGRESS NOTE  SUBJECTIVE: Comfortable  PHYSICAL EXAM: Filed Vitals:   09/16/12 1510 09/16/12 1551 09/16/12 2004 09/17/12 0603  BP: 111/90 134/84 131/60 132/74  Pulse: 81 90 88 88  Temp: 98.5 F (36.9 C) 98 F (36.7 C) 99.8 F (37.7 C) 99.8 F (37.7 C)  TempSrc: Oral Oral Oral Oral  Resp: 14 16 16 16   Height:   6\' 2"  (1.88 m)   Weight: 296 lb 8.3 oz (134.5 kg)  296 lb 8.3 oz (134.5 kg)   SpO2: 99% 100% 98% 94%   Palpable thrill in left upper arm AVF  ASSESSMENT/PLAN: Duplex shows that AVF is patent with no significant competing branches noted. I would recommend fistulogram in around 3 weeks when ecchymosis, induration, and swelling are improved. I will arrange f/u in 2-3 weeks. Will be available as needed.   Cari Caraway Beeper: [phone] 09/17/2012

## 2012-09-17 NOTE — Progress Notes (Signed)
Patient refused CPAP for the night.Patient on 2lpm with Sp02=98%

## 2012-09-17 NOTE — Progress Notes (Signed)
[facility] ROUNDING NOTE   Subjective:   Interval History: improved Objective:  Vital signs in last 24 hours:  Temp:  [97.5 F (36.4 C)-99.8 F (37.7 C)] 97.5 F (36.4 C) (11/10 1001) Pulse Rate:  [65-90] 65  (11/10 1001) Resp:  [13-16] 16  (11/10 1001) BP: (111-140)/(60-90) 123/76 mmHg (11/10 1001) SpO2:  [91 %-100 %] 91 % (11/10 1001) Weight:  [134.5 kg (296 lb 8.3 oz)-139.6 kg (307 lb 12.2 oz)] 134.5 kg (296 lb 8.3 oz) (11/09 2004)  Weight change: 3.3 kg (7 lb 4.4 oz) Filed Weights   09/16/12 1150 09/16/12 1510 09/16/12 2004  Weight: 139.6 kg (307 lb 12.2 oz) 134.5 kg (296 lb 8.3 oz) 134.5 kg (296 lb 8.3 oz)    Intake/Output: I/O last 3 completed shifts: In: 600 [P.O.:600] Out: 4900 [Urine:1700; Other:3200]   Intake/Output this shift:  Total I/O In: 240 [P.O.:240] Out: 200 [Urine:200]  CVS- RRR RS- CTA ABD- BS present soft non-distended EXT-1+ edema   Basic Metabolic Panel:  Lab 09/16/12 1610 09/15/12 1752 09/15/12 1020 09/14/12 1655 09/13/12 1330 09/13/12 0605 09/12/12 1754  NA 139 142 142 141 145 -- --  K 4.5 4.6 4.6 4.6 4.6 -- --  CL 101 104 102 101 108 -- --  CO2 24 26 24 25 22  -- --  GLUCOSE 163* 169* 164* 141* 120* -- --  BUN 95* 94* 95* 93* 119* -- --  CREATININE 9.09* 9.24* 9.05* 8.70* 10.77* -- --  CALCIUM 10.2 10.0 9.9 -- -- -- --  MG -- -- -- -- -- 2.6* 2.9*  PHOS 8.1* -- 7.6* 7.4* 7.9* -- 9.2*    Liver Function Tests:  Lab 09/16/12 1219 09/15/12 1020 09/14/12 1655 09/13/12 1330 09/12/12 1329  AST -- -- -- -- 17  ALT -- -- -- -- 17  ALKPHOS -- -- -- -- 59  BILITOT -- -- -- -- 0.3  PROT -- -- -- -- 8.2  ALBUMIN 3.1* 3.1* 3.0* 3.1* 3.7   No results found for this basename: LIPASE:5,AMYLASE:5 in the last 168 hours No results found for this basename: AMMONIA:3 in the last 168 hours  CBC:  Lab 09/16/12 1219 09/15/12 1752 09/14/12 1631 09/13/12 1330 09/13/12 0325 09/12/12 1329  WBC 11.8* 10.8* 9.9 9.2 9.8 --  NEUTROABS --  -- -- -- -- 8.4*  HGB 8.4* 8.6* 7.2* 7.1* 7.0* --  HCT 25.9* 27.5* 22.8* 22.1* 22.0* --  MCV 100.4* 101.5* 101.8* 100.9* 101.9* --  PLT 211 197 214 189 183 --    Cardiac Enzymes:  Lab 09/13/12 0605 09/12/12 2254 09/12/12 1755  CKTOTAL -- -- --  CKMB -- -- --  CKMBINDEX -- -- --  TROPONINI 0.42* 0.57* 0.32*    BNP: No components found with this basename: POCBNP:5  CBG:  Lab 09/16/12 2006 09/16/12 1653 09/16/12 0729 09/15/12 2024 09/15/12 1348  GLUCAP 161* 107* 135* 135* 140*    Microbiology: Results for orders placed during the hospital encounter of 09/12/12  MRSA PCR SCREENING     Status: Normal   Collection Time   09/12/12  6:50 PM      Component Value Range Status Comment   MRSA by PCR NEGATIVE  NEGATIVE Final     Coagulation Studies: No results found for this basename: LABPROT:5,INR:5 in the last 72 hours  Urinalysis: No results found for this basename: COLORURINE:2,APPERANCEUR:2,LABSPEC:2,PHURINE:2,GLUCOSEU:2,HGBUR:2,BILIRUBINUR:2,KETONESUR:2,PROTEINUR:2,UROBILINOGEN:2,NITRITE:2,LEUKOCYTESUR:2 in the last 72 hours    Imaging: Dg Chest Port 1 View  09/15/2012  *RADIOLOGY REPORT*  Clinical Data: Central line  placement  PORTABLE CHEST - 1 VIEW  Comparison: Chest radiograph 09/14/2012  Findings: There is interval placement of large bore right central venous line.  Split catheter tips in the distal SVC.  No pneumothorax.  Stable enlarged heart silhouette.  There is central venous congestion and mild pulmonary edema not changed from prior.  IMPRESSION: 1.  Interval placement right central venous line without complication. 2.  Cardiomegaly and mild pulmonary edema.   Original Report Authenticated By: [name], M.D.      Medications:        . [COMPLETED] colchicine  0.6 mg Oral Once  . darbepoetin (ARANESP) injection - DIALYSIS  60 mcg Intravenous Q Sat-HD  . enoxaparin (LOVENOX) injection  30 mg Subcutaneous Q24H  . ferric gluconate (FERRLECIT/NULECIT) IV   125 mg Intravenous Q T,Th,Sa-HD  . insulin aspart  0-5 Units Subcutaneous QHS  . insulin aspart  0-9 Units Subcutaneous TID WC  . multivitamin  1 tablet Oral Daily  . paricalcitol  2 mcg Intravenous 3 times weekly  . predniSONE  50 mg Oral Q breakfast  . sevelamer  800 mg Oral TID WC  . sodium chloride  3 mL Intravenous Q12H  . [DISCONTINUED] darbepoetin (ARANESP) injection - DIALYSIS  60 mcg Intravenous Q Thu-HD   sodium chloride, sodium chloride, acetaminophen, acetaminophen, feeding supplement (NEPRO CARB STEADY), guaiFENesin, heparin, heparin, HYDROmorphone (DILAUDID) injection, lidocaine, lidocaine-prilocaine, ondansetron (ZOFRAN) IV, ondansetron, oxyCODONE, pentafluoroprop-tetrafluoroeth, [DISCONTINUED] fentaNYL  Assessment/ Plan:  Advanced CKD 5. Plan dialysis today through permacath  Anemia iron stores low will replete and continue aranesp  Bones continue binders and vitamin D  Access Using permcath VVS following  Nutrition Renal vitamins daily  Volume massive swelling on admission improved  Much better plan dialysis tomorrow   LOS: 5 Nicolas Mccann W @TODAY @10 :44 AM

## 2012-09-17 NOTE — Progress Notes (Signed)
Ultrasound evaluation of left brachiocephalic fistula completed.  The graft appears patent and tortuous.  No branches were noted.

## 2012-09-17 NOTE — Progress Notes (Signed)
Pt has had intermittent PVC's on telemetry this morning. Pt asymptomatic. MD notified.

## 2012-09-18 ENCOUNTER — Inpatient Hospital Stay (HOSPITAL_COMMUNITY): Payer: Medicare Other

## 2012-09-18 ENCOUNTER — Encounter (HOSPITAL_COMMUNITY): Payer: Self-pay | Admitting: Vascular Surgery

## 2012-09-18 ENCOUNTER — Telehealth: Payer: Self-pay | Admitting: Vascular Surgery

## 2012-09-18 LAB — CBC
HCT: 27.3 % — ABNORMAL LOW (ref 39.0–52.0)
MCH: 33.5 pg (ref 26.0–34.0)
MCV: 101.5 fL — ABNORMAL HIGH (ref 78.0–100.0)
Platelets: 253 10*3/uL (ref 150–400)
RDW: 14.8 % (ref 11.5–15.5)
WBC: 14.3 10*3/uL — ABNORMAL HIGH (ref 4.0–10.5)

## 2012-09-18 LAB — RENAL FUNCTION PANEL
Albumin: 3.2 g/dL — ABNORMAL LOW (ref 3.5–5.2)
BUN: 79 mg/dL — ABNORMAL HIGH (ref 6–23)
CO2: 25 mEq/L (ref 19–32)
Calcium: 10.7 mg/dL — ABNORMAL HIGH (ref 8.4–10.5)
Chloride: 97 mEq/L (ref 96–112)
Creatinine, Ser: 7.74 mg/dL — ABNORMAL HIGH (ref 0.50–1.35)
GFR calc non Af Amer: 6 mL/min — ABNORMAL LOW (ref >90)

## 2012-09-18 LAB — GLUCOSE, CAPILLARY
Glucose-Capillary: 137 mg/dL — ABNORMAL HIGH (ref 70–99)
Glucose-Capillary: 177 mg/dL — ABNORMAL HIGH (ref 70–99)
Glucose-Capillary: 153 mg/dL — ABNORMAL HIGH (ref 70–99)
Glucose-Capillary: 167 mg/dL — ABNORMAL HIGH (ref 70–99)

## 2012-09-18 MED ORDER — PARICALCITOL 5 MCG/ML IV SOLN
INTRAVENOUS | Status: AC
  Administered 2012-09-18: 2 ug via INTRAVENOUS
  Filled 2012-09-18: qty 1

## 2012-09-18 NOTE — Telephone Encounter (Signed)
Message copied by Fredrich Birks on Mon Sep 18, 2012  1:08 PM ------      Message from: Melene Plan      Created: Mon Sep 18, 2012 10:16 AM      Regarding: FW: f/u                   ----- Message -----         From: Chuck Hint, MD         Sent: 09/18/2012   7:18 AM           To: Reuel Derby, Melene Plan, RN      Subject: f/u                                                      He needs a f/u visit in around 3 weeks to check on his fistula.      Thanks      CSD

## 2012-09-18 NOTE — Progress Notes (Signed)
Physical Therapy Treatment Patient Details Name: Nicolas Mccann MRN: [id number] DOB: October 29, 1946 Today's Date: 09/18/2012 Time: 1450-1505 PT Time Calculation (min): 15 min  PT Assessment / Plan / Recommendation Comments on Treatment Session  Pt adm with renal failure and pulmonary edema.  Pt continues to have fatigue especially after HD.  Feels he has the support of wife and grandson to manage at home.    Follow Up Recommendations  Home health PT     Does the patient have the potential to tolerate intense rehabilitation     Barriers to Discharge        Equipment Recommendations  Rolling walker with 5" wheels;3 in 1 bedside comode (wide BSC.)    Recommendations for Other Services    Frequency Min 3X/week   Plan Discharge plan remains appropriate;Frequency remains appropriate    Precautions / Restrictions Precautions Precautions: Fall   Pertinent Vitals/Pain No c/o's    Mobility  Bed Mobility Bed Mobility: Supine to Sit;Sitting - Scoot to Edge of Bed Supine to Sit: 4: Min assist;HOB elevated;With rails Sitting - Scoot to Edge of Bed: 6: Modified independent (Device/Increase time) Transfers Sit to Stand: 4: Min guard;With upper extremity assist;From elevated surface;From bed Stand to Sit: 4: Min guard;With upper extremity assist;To bed Ambulation/Gait Ambulation/Gait Assistance: 4: Min guard Ambulation Distance (Feet): 70 Feet Assistive device: Rolling walker Ambulation/Gait Assistance Details: Cues to stay closer to the walker. Gait Pattern: Step-through pattern;Decreased step length - right;Decreased step length - left;Shuffle;Trunk flexed;Wide base of support    Exercises     PT Diagnosis:    PT Problem List:   PT Treatment Interventions:     PT Goals Acute Rehab PT Goals PT Goal: Supine/Side to Sit - Progress: Progressing toward goal PT Goal: Sit to Stand - Progress: Progressing toward goal PT Goal: Stand to Sit - Progress: Progressing toward goal PT Goal:  Ambulate - Progress: Progressing toward goal  Visit Information  Last PT Received On: 09/18/12 Assistance Needed: +1    Subjective Data  Subjective: Pt states he feels wiped out after HD.  Pt also states he feels he has the support at home to manage at home.   Cognition  Overall Cognitive Status: Appears within functional limits for tasks assessed/performed Arousal/Alertness: Awake/alert Orientation Level: Appears intact for tasks assessed Behavior During Session: [facility] for tasks performed    Balance  Static Standing Balance Static Standing - Balance Support: Bilateral upper extremity supported (on walker) Static Standing - Level of Assistance: 5: Stand by assistance  End of Session PT - End of Session Activity Tolerance: Patient limited by fatigue Patient left: in bed;with call bell/phone within reach Nurse Communication: Mobility status   GP     Keyira Mondesir 09/18/2012, 3:09 PM  [employer] PT [phone]

## 2012-09-18 NOTE — Plan of Care (Signed)
Problem: Food- and Nutrition-Related Knowledge Deficit (NB-1.1) Goal: Nutrition education Formal process to instruct or train a patient/client in a skill or to impart knowledge to help patients/clients voluntarily manage or modify food choices and eating behavior to maintain or improve health.  Outcome: Completed/Met Date Met:  09/18/12  Nutrition Education Note  RD consulted for Renal Education. Provided Choose-A-Meal Booklet to patient during HD. Reviewed food groups and provided written recommended serving sizes specifically determined for patient's current nutritional status.   Explained why diet restrictions are needed and provided lists of foods to limit/avoid that are high potassium, sodium, and phosphorus. Provided specific recommendations on safer alternatives of these foods. Strongly encouraged compliance of this diet.   Discussed importance of protein intake at each meal and snack. Provided examples of how to maximize protein intake throughout the day. Discussed need for fluid restriction with dialysis, importance of minimizing weight gain between HD treatments, and renal-friendly beverage options.  Encouraged pt to discuss specific diet questions/concerns with RD at HD outpatient facility.   Expect fair compliance.  Body mass index is 38.30 kg/(m^2). Pt meets criteria for Obese Class II based on current BMI.  Current diet order is Renal 80-90, patient is consuming approximately 100% of meals at this time. Labs and medications reviewed. No further nutrition interventions warranted at this time. RD contact information provided. If additional nutrition issues arise, please re-consult RD.  Jarold Motto MS, RD, LDN Pager: [phone] After-hours pager: [phone]

## 2012-09-18 NOTE — Progress Notes (Signed)
Pt's IV is out of date, but he is refusing re-start at this time. The IV still flushes well, and is not painful, with no signs of infection. Will leave IV in for today, but re-assess tomorrow. MD aware.

## 2012-09-18 NOTE — Progress Notes (Addendum)
Patient ID: Nicolas Mccann, male   DOB: 1946/01/28, 66 y.o.   MRN: [id number]  [facility] Progress Note    Subjective:   No new complaints   Objective:   BP 106/62  Pulse 81  Temp 97.3 F (36.3 C) (Oral)  Resp 15  Ht 6\' 2"  (1.88 m)  Wt 135.3 kg (298 lb 4.5 oz)  BMI 38.30 kg/m2  SpO2 99%  Physical Exam: Gen:WD WN obese AAM in NAD CVS:no rub Resp:CTA WUJ:WJXBJY Ext:tr edema, LUE AVF +ecchymosis, +T/B  Labs: BMET  Lab 09/18/12 0908 09/16/12 1219 09/15/12 1752 09/15/12 1020 09/14/12 1655 09/13/12 1330 09/13/12 0325 09/12/12 1754 09/12/12 1329  NA 136 139 142 142 141 145 143 -- --  K 4.2 4.5 4.6 4.6 4.6 4.6 4.5 -- --  CL 97 101 104 102 101 108 106 -- --  CO2 25 24 26 24 25 22 22  -- --  GLUCOSE 198* 163* 169* 164* 141* 120* 120* -- --  BUN 79* 95* 94* 95* 93* 119* 116* -- --  CREATININE 7.74* 9.09* 9.24* 9.05* 8.70* 10.77* 10.55* -- --  ALBUMIN 3.2* 3.1* -- 3.1* 3.0* 3.1* -- -- 3.7  CALCIUM 10.7* 10.2 10.0 9.9 9.7 9.7 9.8 -- --  PHOS 6.5* 8.1* -- 7.6* 7.4* 7.9* -- 9.2* --   CBC  Lab 09/18/12 0908 09/16/12 1219 09/15/12 1752 09/14/12 1631 09/12/12 1329  WBC 14.3* 11.8* 10.8* 9.9 --  NEUTROABS -- -- -- -- 8.4*  HGB 9.0* 8.4* 8.6* 7.2* --  HCT 27.3* 25.9* 27.5* 22.8* --  MCV 101.5* 100.4* 101.5* 101.8* --  PLT 253 211 197 214 --    @IMGRELPRIORS @ Medications:      . [COMPLETED] colchicine  0.6 mg Oral Once  . darbepoetin (ARANESP) injection - DIALYSIS  60 mcg Intravenous Q Fri-HD  . docusate sodium  100 mg Oral BID  . enoxaparin (LOVENOX) injection  30 mg Subcutaneous Q24H  . ferric gluconate (FERRLECIT/NULECIT) IV  125 mg Intravenous Q M,W,F-HD  . insulin aspart  0-5 Units Subcutaneous QHS  . insulin aspart  0-9 Units Subcutaneous TID WC  . multivitamin  1 tablet Oral Daily  . paricalcitol  2 mcg Intravenous 3 times weekly  . sevelamer  800 mg Oral TID WC  . sodium chloride  3 mL Intravenous Q12H  . [DISCONTINUED] darbepoetin (ARANESP) injection  - DIALYSIS  60 mcg Intravenous Q Sat-HD  . [DISCONTINUED] ferric gluconate (FERRLECIT/NULECIT) IV  125 mg Intravenous Q T,Th,Sa-HD     Assessment/ Plan:   1. Vascular access- appreciate VVS input.  Will repeat fistulogram in about 3 weeks after infiltration is absorbed 2. ESRDcont with hd.  Out pt schedule will be tts.  3. Anemia:cont with esa and iron 4. 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 Nutrition:stable 6. Hypertension:stable 7. Dispo- d/c to home with HHC/PT/Ot once outpt HD can be arranged.  Hopefully in the next 24-48 hours if stable from medical standpoint.  Annalis Kaczmarczyk A 09/18/2012, 12:12 PM

## 2012-09-18 NOTE — Procedures (Signed)
Patient was seen on dialysis and the procedure was supervised. BFR 300 [address] RIJ PC BP is 112/64.  Patient appears to be tolerating treatment well.  LUE AVF +T/B with large ecchymosis.

## 2012-09-18 NOTE — Telephone Encounter (Signed)
lvm for patient and mailed letter, dpm

## 2012-09-18 NOTE — Progress Notes (Addendum)
[facility] Progress Note    Nicolas Mccann:[id number] DOB: 01/09/1946 DOA: 09/12/2012 PCP: Sheila Oats, MD  Brief narrative: 66 year old male with known hypertension and chronic kidney disease. Had not yet started dialysis but has a fistula in place. Endorses progressive shortness of breath for 3 months that has worsened over the past few weeks. Also increasing lower extremity edema. Presented to the emergency department because of hypoxemia and shortness of breath with O2 sats in the 70s. Despite high flow oxygen was only able to maintain saturations in the high 80s and continue with persistent tachypnea and labored respiratory effort so a BiPAP was utilized. His chest x-ray in the ER did demonstrate pulmonary edema. Pulmonary medicine was consulted as well as renal team. Concerns patient may are emergent hemodialysis. Patient apparently endorsed he had previously been receiving healthcare [facility] but has been unable to see a doctor for at least one month and has been out of his medications for at least that long. 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, GFR less than 15 ml/min New HD *Appreciate nephrology assistance, HD per renal Access issues with AVF, s/p HD catheter and fistulogram Being dialyzed thru HD catheter  Diabetes mellitus with renal complications *Was supposed to be on Glucotrol XL at home *Hemoglobin A1c 5.9, CBG stable *continue SSI  Leukocytosis *WBC up a little, suspect secondary to steroids 11/9 and 11/10 Afebrile, stable otherwise  Anemia in chronic renal disease *Management per renal Transfused 1 unit so far aranesp with HD and IV Iron Anemia panel  mild iron deficiency only  HTN (hypertension) *Blood pressure currently well-controlled with dialysis  L knee pain: OA 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 rate 15, height 6\' 2"  (1.88 m), weight 135.3 kg (298 lb 4.5 oz), SpO2 99.00%.  Intake/Output Summary (Last 24 hours) at 09/18/12 1158 Last data filed at 09/18/12 0800  Gross per 24 hour  Intake   1180 ml  Output   1675 ml  Net   -495 ml     Exam: General: AAOx3, no distress Lungs: Clear to auscultation bilaterally except for fine bibasilar crackles Cardiovascular: Regular rate and rhythm without murmur gallop or rub normal S1 and S2 Abdomen: Nontender, nondistended, soft, bowel sounds positive, no rebound, no ascites, no appreciable mass Musculoskeletal: L knee mobility and ROM improved Neurological: Alert and oriented x3 moves all extremities x4, exam non-focal Ext: 1 plus edema  Data Reviewed: Basic Metabolic Panel:  Lab 09/18/12 8119 09/17/12 1801 09/16/12 1219 09/15/12 1752 09/15/12 1020 09/14/12 1655 09/13/12 1330 09/13/12 0605 09/12/12 1754  NA 136 -- 139 142 142 141 -- -- --  K 4.2 -- 4.5 4.6 4.6 4.6 -- -- --  CL 97 -- 101 104 102 101 -- -- --  CO2 25 -- 24 26  24 25 -- -- --  GLUCOSE 198* -- 163* 169* 164* 141* -- -- --  BUN 79* -- 95* 94* 95* 93* -- -- --  CREATININE 7.74* -- 9.09* 9.24* 9.05* 8.70* -- -- --  CALCIUM 10.7* -- 10.2 10.0 9.9 9.7 -- -- --  MG -- 2.4 -- -- -- -- -- 2.6* 2.9*  PHOS  6.5* -- 8.1* -- 7.6* 7.4* 7.9* -- --   Liver Function Tests:  Lab 09/18/12 0908 09/16/12 1219 09/15/12 1020 09/14/12 1655 09/13/12 1330 09/12/12 1329  AST -- -- -- -- -- 17  ALT -- -- -- -- -- 17  ALKPHOS -- -- -- -- -- 59  BILITOT -- -- -- -- -- 0.3  PROT -- -- -- -- -- 8.2  ALBUMIN 3.2* 3.1* 3.1* 3.0* 3.1* --   CBC:  Lab 09/18/12 0908 09/16/12 1219 09/15/12 1752 09/14/12 1631 09/13/12 1330 09/12/12 1329  WBC 14.3* 11.8* 10.8* 9.9 9.2 --  NEUTROABS -- -- -- -- -- 8.4*  HGB 9.0* 8.4* 8.6* 7.2* 7.1* --  HCT 27.3* 25.9* 27.5* 22.8* 22.1* --  MCV 101.5* 100.4* 101.5* 101.8* 100.9* --  PLT 253 211 197 214 189 --   Cardiac Enzymes:  Lab 09/13/12 0605 09/12/12 2254 09/12/12 1755  CKTOTAL -- -- --  CKMB -- -- --  CKMBINDEX -- -- --  TROPONINI 0.42* 0.57* 0.32*   BNP (last 3 results)  Basename 09/12/12 1329  PROBNP 3528.0*   CBG:  Lab 09/17/12 2126 09/17/12 1709 09/17/12 1143 09/17/12 0816 09/16/12 2006  GLUCAP 199* 196* 177* 137* 161*    Recent Results (from the past 240 hour(s))  MRSA PCR SCREENING     Status: Normal   Collection Time   09/12/12  6:50 PM      Component Value Range Status Comment   MRSA by PCR NEGATIVE  NEGATIVE Final         Zannie Cove, MD [facility] [phone]

## 2012-09-19 DIAGNOSIS — N186 End stage renal disease: Secondary | ICD-10-CM | POA: Diagnosis present

## 2012-09-19 LAB — GLUCOSE, CAPILLARY: Glucose-Capillary: 163 mg/dL — ABNORMAL HIGH (ref 70–99)

## 2012-09-19 LAB — CREATININE, SERUM
GFR calc Af Amer: 10 mL/min — ABNORMAL LOW (ref >90)
GFR calc non Af Amer: 9 mL/min — ABNORMAL LOW (ref >90)

## 2012-09-19 MED ORDER — LORAZEPAM 0.5 MG PO TABS
0.5000 mg | ORAL_TABLET | Freq: Once | ORAL | Status: AC
  Administered 2012-09-19: 0.5 mg via ORAL
  Filled 2012-09-19: qty 1

## 2012-09-19 MED ORDER — DARBEPOETIN ALFA-POLYSORBATE 60 MCG/0.3ML IJ SOLN: 60.0000 ug | INTRAMUSCULAR | Status: DC

## 2012-09-19 MED ORDER — RENA-VITE PO TABS: 1.0000 | ORAL_TABLET | Freq: Every day | ORAL | Status: DC

## 2012-09-19 MED ORDER — OXYCODONE HCL 5 MG PO TABS: 5.0000 mg | ORAL_TABLET | Freq: Four times a day (QID) | ORAL | Status: DC | PRN

## 2012-09-19 NOTE — Progress Notes (Addendum)
Physical Therapy Treatment Patient Details Name: Nicolas Mccann MRN: [id number] DOB: 1946/06/05 Today's Date: 09/19/2012 Time: 7829-5621 PT Time Calculation (min): 8 min  PT Assessment / Plan / Recommendation Comments on Treatment Session  Pt adm with renal failure and pulmonary edema.  Pt continues to fatigue quickly with walking, but states he only walked short distances prior to admission.     Follow Up Recommendations  Home health PT     Does the patient have the potential to tolerate intense rehabilitation     Barriers to Discharge        Equipment Recommendations  Rolling walker with 5" wheels;3 in 1 bedside comode (wide BSC.)    Recommendations for Other Services    Frequency Min 3X/week   Plan Discharge plan remains appropriate;Frequency remains appropriate    Precautions / Restrictions Precautions Precautions: Fall Precaution Comments: L knee "buckles"   Pertinent Vitals/Pain *pt denies pain**    Mobility  Bed Mobility Bed Mobility: Supine to Sit;Sitting - Scoot to Edge of Bed Supine to Sit: HOB elevated;With rails;6: Modified independent (Device/Increase time) Sitting - Scoot to Edge of Bed: 6: Modified independent (Device/Increase time) Transfers Sit to Stand: 4: Min guard;With upper extremity assist;From elevated surface;From bed Stand to Sit: 4: Min guard;With upper extremity assist;To bed Details for Transfer Assistance: VCs for safety/hand placement Ambulation/Gait Ambulation/Gait Assistance: 4: Min guard;5: Supervision Ambulation Distance (Feet): 75 Feet Assistive device: Rolling walker Gait Pattern: Step-through pattern;Trunk flexed General Gait Details: VCs for positioning in RW, pt tends to walk too far behind RW    Exercises     PT Diagnosis:    PT Problem List:   PT Treatment Interventions:     PT Goals Acute Rehab PT Goals PT Goal Formulation: With patient Time For Goal Achievement: 10/01/12 Potential to Achieve Goals: Good Pt will go  Supine/Side to Sit: Independently PT Goal: Supine/Side to Sit - Progress: Progressing toward goal Pt will go Sit to Stand: with supervision PT Goal: Sit to Stand - Progress: Progressing toward goal Pt will go Stand to Sit: with supervision PT Goal: Stand to Sit - Progress: Progressing toward goal Pt will Ambulate: 51 - 150 feet;with supervision;with least restrictive assistive device PT Goal: Ambulate - Progress: Progressing toward goal Pt will Go Up / Down Stairs: 3-5 stairs;with rail(s);with supervision  Visit Information  Last PT Received On: 09/19/12 Assistance Needed: +1    Subjective Data  Subjective: Pt states he may DC today and feels he can manage at home. He walked only short distances PTA.  Patient Stated Goal: go home   Cognition  Overall Cognitive Status: Appears within functional limits for tasks assessed/performed Arousal/Alertness: Awake/alert Orientation Level: Appears intact for tasks assessed Behavior During Session: [facility] for tasks performed    Balance     End of Session PT - End of Session Activity Tolerance: Patient limited by fatigue Patient left: with call bell/phone within reach;in chair Nurse Communication: Mobility status   GP     Ralene Bathe Kistler 09/19/2012, 11:37 AM [phone]

## 2012-09-19 NOTE — Progress Notes (Signed)
Patient ID: ACEL NATZKE, male   DOB: Jan 18, 1946, 66 y.o.   MRN: [id number]  [facility] Progress Note    Subjective:   Ready to go home   Objective:   BP 134/78  Pulse 93  Temp 99.1 F (37.3 C) (Oral)  Resp 20  Ht 6\' 2"  (1.88 m)  Wt 131.9 kg (290 lb 12.6 oz)  BMI 37.33 kg/m2  SpO2 91%  Physical Exam: Gen:WD obese AAM inNAD CVS:no rub Resp:CTA ION:GEXBMW Ext:no edema, LUE AVF +T/B with large ecchymosis  Labs: BMET  Lab 09/19/12 0525 09/18/12 0908 09/16/12 1219 09/15/12 1752 09/15/12 1020 09/14/12 1655 09/13/12 1330 09/13/12 0325 09/12/12 1754 09/12/12 1329  NA -- 136 139 142 142 141 145 143 -- --  K -- 4.2 4.5 4.6 4.6 4.6 4.6 4.5 -- --  CL -- 97 101 104 102 101 108 106 -- --  CO2 -- 25 24 26 24 25 22 22  -- --  GLUCOSE -- 198* 163* 169* 164* 141* 120* 120* -- --  BUN -- 79* 95* 94* 95* 93* 119* 116* -- --  CREATININE 6.06* 7.74* 9.09* 9.24* 9.05* 8.70* 10.77* -- -- --  ALBUMIN -- 3.2* 3.1* -- 3.1* 3.0* 3.1* -- -- 3.7  CALCIUM -- 10.7* 10.2 10.0 9.9 9.7 9.7 9.8 -- --  PHOS -- 6.5* 8.1* -- 7.6* 7.4* 7.9* -- 9.2* --   CBC  Lab 09/18/12 0908 09/16/12 1219 09/15/12 1752 09/14/12 1631 09/12/12 1329  WBC 14.3* 11.8* 10.8* 9.9 --  NEUTROABS -- -- -- -- 8.4*  HGB 9.0* 8.4* 8.6* 7.2* --  HCT 27.3* 25.9* 27.5* 22.8* --  MCV 101.5* 100.4* 101.5* 101.8* --  PLT 253 211 197 214 --    @IMGRELPRIORS @ Medications:      . darbepoetin (ARANESP) injection - DIALYSIS  60 mcg Intravenous Q Fri-HD  . docusate sodium  100 mg Oral BID  . enoxaparin (LOVENOX) injection  30 mg Subcutaneous Q24H  . ferric gluconate (FERRLECIT/NULECIT) IV  125 mg Intravenous Q M,W,F-HD  . insulin aspart  0-5 Units Subcutaneous QHS  . insulin aspart  0-9 Units Subcutaneous TID WC  . [COMPLETED] LORazepam  0.5 mg Oral Once  . multivitamin  1 tablet Oral Daily  . paricalcitol  2 mcg Intravenous 3 times weekly  . sevelamer  800 mg Oral TID WC  . sodium chloride  3 mL Intravenous Q12H      Assessment/ Plan:   1. Vascular access- appreciate VVS input. Will repeat fistulogram in about 3 weeks after infiltration is absorbed 2. ESRDcont with hd. Out pt schedule will be tts.  Ok to follow up at [facility] for HD on Thursday.  He may need to go there tomorrow to sign paperwork.  3. Anemia:cont with esa and iron 4. 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 Nutrition:stable 6. Hypertension:stable 7. Dispo- d/c to home today with HHC/PT/Ot and HD on Thursday. 8.  Tracy Gerken A 09/19/2012, 10:26 AM

## 2012-09-19 NOTE — Discharge Summary (Signed)
Physician Discharge Summary  Patient ID: Nicolas Mccann MRN: [id number] DOB/AGE: [birth year] 66 y.o.  Admit date: 09/12/2012 Discharge date: 09/19/2012  Primary Care Physician:  Sheila Oats, MD   Disposition and Follow-up:  1. PCP in 1 week 2. [facility] Dr.DIckson in 3 weeks, needs fistulogram in 3 weeks 3. Hemodialysis on 11/14 Thursday 4. 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, GFR less than 15 ml/min  Pulmonary edema  ESRD new hemodialysis  Leukocytosis  Anemia in chronic renal disease  Diabetes mellitus with renal complications  HTN (hypertension)  Deconditioning  Osteoarthritis       Medication List     As of 09/19/2012 11:36 AM    STOP taking these medications         furosemide 80 MG tablet   Commonly known as: LASIX      naproxen sodium 220 MG tablet   Commonly known as: ANAPROX      NIFEdipine 90 MG 24 hr tablet   Commonly known as: ADALAT CC      sodium bicarbonate 650 MG tablet      TAKE these medications         calcitRIOL 0.5 MCG capsule   Commonly known as: ROCALTROL   Take 0.5 mcg by mouth daily.      darbepoetin 60 MCG/0.3ML Soln   Commonly known as: ARANESP   Inject 0.3 mLs (60 mcg total) into the vein every Friday with hemodialysis.      glipiZIDE 5 MG 24 hr tablet   Commonly known as: GLUCOTROL XL   Take 5 mg by mouth daily.      multivitamin Tabs tablet   Take 1 tablet by mouth daily.      sevelamer 800 MG tablet   Commonly known as: RENVELA   Take 800 mg by mouth 3 (three) times daily with meals.          Consults:  Renal Dr. Abel Presto    Significant Diagnostic Studies:   PORTABLE CHEST - 1 VIEW IMPRESSION: 1. Interval placement right central venous line without complication. 2. Cardiomegaly and mild pulmonary edema.      DG Chest 2 View [[id number]]  Resulted:09/14/12 1249     Order Status:Completed   Updated:09/14/12 1249   Narrative:   *CHEST - 2 VIEW IMPRESSION: Unchanged pulmonary vascular congestion and mild pulmonary edema pattern.     DG Chest [place] [address] [place] [[id number]]  Resulted:09/13/12 1308   Order Status:Completed   Updated:09/13/12 6578   Narrative:    IMPRESSION: Prominent cardiomediastinal contours with pulmonary edema pattern, similar to prior.    DG Chest Portable 1 View [[id number]]  Resulted:09/12/12 1415   Order Status:Completed   Updated:09/12/12 1415   Narrative:    IMPRESSION: Enlargement of cardiac silhouette with pulmonary vascular congestion and mild perihilar infiltrates, slightly greater on left, favor edema over infection.         Brief H and P: Nicolas Mccann is a 66 y.o. male with prior h/o of Hypertension, ESRD not on dialysis but has arm fistula, reports being short of breath over the last three months , which has worsened over the last few weeks, associated with productive cough with sputum, no subjective fevers, orthopnea and PND present. Leg edema worsening over the last few weeks, right > left. On arrival to ED he was found to be hypoxemic with oxygen sats in  70's, he was put on 6 lit of nasal canula oxygen with oxygen sats in high 80's. He is tachypnic with increased work of breathing and was put on BIPAP. CXR showed pulmonary edema and we were called to admit patient for acute respiratory failure secondary to fluid over load from ESRD. I called pulmonary consult and renal consult to see if he can be dialyzed tonight.    Consultants:  Nephrology  Pulmonary medicine  Procedures:  Diatek/ HD catheter placement 11/8 per [facility]   Hospital Course:  66 year old male with known hypertension and chronic kidney disease. Had not yet started dialysis but has a fistula in place. Endorses progressive shortness of breath for 3 months that has worsened over the past few weeks. Also increasing lower extremity edema. Presented to the  emergency department because of hypoxemia and shortness of breath with O2 sats in the 70s. Despite high flow oxygen was only able to maintain saturations in the high 80s and continue with persistent tachypnea and labored respiratory effort so a BiPAP was utilized. His chest x-ray in the ER did demonstrate pulmonary edema. Pulmonary medicine was consulted as well as renal team. Concerns patient may are emergent hemodialysis. Patient apparently endorsed he had previously been receiving healthcare [facility] but has been unable to see a doctor for at least one month and has been out of his medications for at least that long. 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 He recommended fistulogram in around 3 weeks when ecchymosis, induration, and swelling are improved. FU  in 2-3 weeks.  Being dialyzed thru HD catheter now Next HD Thursday outpatient, set up before DC  Diabetes mellitus with renal complications  *stable, continue glucotrol *Hemoglobin A1c 5.9, CBG stable   Leukocytosis  *WBC up a little, suspect secondary to steroids 11/9 and 11/10  Afebrile, stable otherwise   Anemia in chronic renal disease  Getting Aranesp and IV Iron with HD Transfused 1 unit so far  Anemia panel mild iron deficiency only   HTN (hypertension)  *Blood pressure currently well-controlled with dialysis   L knee pain: OA vs early Gout: improved with a dose of  Colchicine, Prednisone, ambulating with PT      Discharge condition: Feels well, ready to go home Objective:  Blood pressure 106/62, pulse 81, temperature 97.3 F (36.3 C), temperature source Oral, resp. rate 15, height 6\' 2"  (1.88 m), weight 135.3 kg (298 lb 4.5 oz), SpO2 99.00%.   Intake/Output Summary (Last 24 hours) at 09/18/12 1158 Last data filed at 09/18/12 0800   Gross per 24 hour   Intake  1180 ml   Output  1675 ml   Net  -495 ml    Exam:  General: AAOx3, no distress  Lungs: Clear to auscultation bilaterally except for fine bibasilar crackles  Cardiovascular: Regular rate and rhythm without murmur gallop or rub normal S1 and S2  Abdomen: Nontender, nondistended, soft, bowel sounds positive, no rebound, no ascites, no appreciable mass  Musculoskeletal: L knee mobility and ROM improved  Neurological: Alert and oriented x3 moves all extremities x4, exam non-focal  Ext: no edema      Time spent on Discharge:  Signed: Michaiah Maiden [facility]  09/19/2012, 11:36 AM

## 2012-09-19 NOTE — Progress Notes (Signed)
   CARE MANAGEMENT NOTE 09/19/2012  Patient:  Nicolas Mccann, Nicolas Mccann   Account Number:  [id number]  Date Initiated:  09/13/2012  Documentation initiated by:  Darlyne Russian  Subjective/Objective Assessment:   Patient admitted with acute respiratory distress, hypoxia. History of ESRD, not on dialysis.     Action/Plan:   Progression of care and discharge planning   Anticipated DC Date:  09/19/2012   Anticipated DC Plan:  HOME W HOME HEALTH SERVICES      DC Planning Services  CM consult      Choice offered to / List presented to:  C-1 Patient        HH arranged  HH-2 PT      [facility] agency  [facility]   Status of service:  In process, will continue to follow Medicare Important Message given?   (If response is "NO", the following Medicare IM given date fields will be blank) Date Medicare IM given:   Date Additional Medicare IM given:    Discharge Disposition:    Per UR Regulation:  Reviewed for med. necessity/level of care/duration of stay  If discussed at Long Length of Stay Meetings, dates discussed:   09/19/2012    Comments:  09/19/2012  [address] RN, [place]  [phone] CM referal: HH PT,  Met with patient to discuss discharge planning and home health services. Provided a list of home health agencies. He selected [facility].  [facility] called with referral for [facility] PT

## 2012-09-26 ENCOUNTER — Encounter (HOSPITAL_COMMUNITY): Payer: Self-pay | Admitting: Emergency Medicine

## 2012-09-26 ENCOUNTER — Emergency Department (HOSPITAL_COMMUNITY)
Admission: EM | Admit: 2012-09-26 | Discharge: 2012-09-26 | Disposition: A | Discharge status: Home / Self Care | Payer: Medicare Other | Attending: Emergency Medicine | Admitting: Emergency Medicine

## 2012-09-26 DIAGNOSIS — E162 Hypoglycemia, unspecified: Secondary | ICD-10-CM

## 2012-09-26 DIAGNOSIS — Z79899 Other long term (current) drug therapy: Secondary | ICD-10-CM | POA: Insufficient documentation

## 2012-09-26 DIAGNOSIS — E1169 Type 2 diabetes mellitus with other specified complication: Secondary | ICD-10-CM | POA: Insufficient documentation

## 2012-09-26 DIAGNOSIS — N186 End stage renal disease: Secondary | ICD-10-CM | POA: Insufficient documentation

## 2012-09-26 LAB — COMPREHENSIVE METABOLIC PANEL
Albumin: 3.6 g/dL (ref 3.5–5.2)
Alkaline Phosphatase: 63 U/L (ref 39–117)
BUN: 47 mg/dL — ABNORMAL HIGH (ref 6–23)
CO2: 26 mEq/L (ref 19–32)
Chloride: 95 mEq/L — ABNORMAL LOW (ref 96–112)
GFR calc non Af Amer: 6 mL/min — ABNORMAL LOW (ref >90)
Potassium: 4 mEq/L (ref 3.5–5.1)
Total Bilirubin: 0.2 mg/dL — ABNORMAL LOW (ref 0.3–1.2)

## 2012-09-26 LAB — CBC WITH DIFFERENTIAL/PLATELET
Basophils Relative: 0 % (ref 0–1)
HCT: 33.1 % — ABNORMAL LOW (ref 39.0–52.0)
Hemoglobin: 10.7 g/dL — ABNORMAL LOW (ref 13.0–17.0)
Lymphocytes Relative: 8 % — ABNORMAL LOW (ref 12–46)
Lymphs Abs: 1.2 10*3/uL (ref 0.7–4.0)
MCHC: 32.3 g/dL (ref 30.0–36.0)
Monocytes Relative: 7 % (ref 3–12)
Neutro Abs: 11.8 10*3/uL — ABNORMAL HIGH (ref 1.7–7.7)
Neutrophils Relative %: 84 % — ABNORMAL HIGH (ref 43–77)
RBC: 3.26 MIL/uL — ABNORMAL LOW (ref 4.22–5.81)
WBC: 14 10*3/uL — ABNORMAL HIGH (ref 4.0–10.5)

## 2012-09-26 LAB — URINALYSIS, ROUTINE W REFLEX MICROSCOPIC
Glucose, UA: 100 mg/dL — AB
Nitrite: NEGATIVE
Specific Gravity, Urine: 1.011 (ref 1.005–1.030)
pH: 7 (ref 5.0–8.0)

## 2012-09-26 LAB — URINE MICROSCOPIC-ADD ON

## 2012-09-26 LAB — GLUCOSE, CAPILLARY

## 2012-09-26 NOTE — ED Notes (Signed)
CBG completed of 114 on arrival

## 2012-09-26 NOTE — ED Notes (Signed)
Discharge instructions reviewed. Pt verbalized understanding.  

## 2012-09-26 NOTE — ED Provider Notes (Signed)
History     CSN: [id number]  Arrival date & time 09/26/12  4098   First MD Initiated Contact with Patient 09/26/12 [phone]      Chief Complaint  Patient presents with  . Hypoglycemia     The history is provided by the patient.   patient reports he awoke and his blood sugar was low and his wife called EMS because of confusion and altered mental status.  She then gave him orange juice and eggs and he reports he began feeling much better.  His blood sugar rose on its own at that time.  The patient only takes glipizide for his diabetes.  He is a dialysis patient who dialyzes through a right subclavian PermCath.  Dialysis is Tuesday Thursday Saturday.  No recently missed dialysis.  He is scheduled to have dialysis at 11 AM today.  He has no complaints at this time.  He denies fevers or chills.  He has no chest pain or shortness of breath.  He denies nausea vomiting and diarrhea.  He reports his blood sugars normally run in the low 100s.  He is May no recent changes to his medications.  Past Medical History  Diagnosis Date  . Renal failure   . Diabetes mellitus without complication     Past Surgical History  Procedure Date  . Insertion of dialysis catheter 09/15/2012    Procedure: INSERTION OF DIALYSIS CATHETER;  Surgeon: Chuck Hint, MD;  Location: [facility] OR;  Service: Vascular;  Laterality: N/A;    History reviewed. No pertinent family history.  History  Substance Use Topics  . Smoking status: Never Smoker   . Smokeless tobacco: Not on file  . Alcohol Use: No      Review of Systems  All other systems reviewed and are negative.    Allergies  Review of patient's allergies indicates no known allergies.  Home Medications   Current Outpatient Rx  Name  Route  Sig  Dispense  Refill  . CALCITRIOL 0.5 MCG PO CAPS   Oral   Take 0.5 mcg by mouth daily.         Marland Kitchen GLIPIZIDE ER 5 MG PO TB24   Oral   Take 5 mg by mouth daily.         Marland Kitchen RENA-VITE PO TABS   Oral  Take 1 tablet by mouth daily.   30 tablet   0   . OXYCODONE HCL 5 MG PO TABS   Oral   Take 1 tablet (5 mg total) by mouth every 6 (six) hours as needed.   30 tablet   0   . SEVELAMER CARBONATE 800 MG PO TABS   Oral   Take 800 mg by mouth 3 (three) times daily with meals.           BP 117/66  Pulse 102  Temp 98.2 F (36.8 C) (Oral)  Resp 20  SpO2 92%  Physical Exam  Nursing note and vitals reviewed. Constitutional: He is oriented to person, place, and time. He appears well-developed and well-nourished.  HENT:  Head: Normocephalic and atraumatic.  Eyes: EOM are normal.  Neck: Normal range of motion.  Cardiovascular: Normal rate, regular rhythm, normal heart sounds and intact distal pulses.   Pulmonary/Chest: Effort normal and breath sounds normal. No respiratory distress.       PermCath in right subclavian without secondary signs of infection  Abdominal: Soft. He exhibits no distension. There is no tenderness.  Musculoskeletal: Normal range of motion.  Neurological:  He is alert and oriented to person, place, and time.  Skin: Skin is warm and dry.  Psychiatric: He has a normal mood and affect. Judgment normal.    ED Course  Procedures (including critical care time)  Labs Reviewed  GLUCOSE, CAPILLARY - Abnormal; Notable for the following:    Glucose-Capillary 114 (*)     All other components within normal limits  CBC WITH DIFFERENTIAL - Abnormal; Notable for the following:    WBC 14.0 (*)     RBC 3.26 (*)     Hemoglobin 10.7 (*)     HCT 33.1 (*)     MCV 101.5 (*)     Neutrophils Relative 84 (*)     Neutro Abs 11.8 (*)     Lymphocytes Relative 8 (*)     All other components within normal limits  COMPREHENSIVE METABOLIC PANEL - Abnormal; Notable for the following:    Chloride 95 (*)     Glucose, Bld 158 (*)     BUN 47 (*)     Creatinine, Ser 7.91 (*)     Calcium 10.6 (*)     Total Bilirubin 0.2 (*)     GFR calc non Af Amer 6 (*)     GFR calc Af Amer 7 (*)      All other components within normal limits  URINALYSIS, ROUTINE W REFLEX MICROSCOPIC   No results found.   1. Hypoglycemia       MDM  10:05 AM The patient is well appearing.  He has no complaints at this time.  I believe that hypoglycemia may have been secondary to poor dietary intake last night.  His blood sugar is on its own when he had breakfast this morning.  The patient is scheduled to have routine dialysis today at 11 AM.  Spoke with the dialysis center and they state that they will be able to perform his dialysis today.  He has no need for acute/urgent dialysis in the hospital.        Lyanne Co, MD 09/26/12 1007

## 2012-09-26 NOTE — ED Notes (Addendum)
Per EMS pt's wife called because the pt was shaky and she gave him juice and a piece of candy but did not check cbg. EMS checked his sugar initially and it was 58. EMS gave oral glucose and more juice. The pt had 3 hard boiled eggs, and a bagel and his cbg eventually came up to 106. Vitals were 116/66, p 100. Pt on dialysis, schedule is T,Th,Sat.

## 2012-09-26 NOTE — ED Notes (Signed)
Pt is worried about missing dialysis today.

## 2012-10-10 ENCOUNTER — Encounter: Payer: Self-pay | Admitting: Vascular Surgery

## 2012-10-11 ENCOUNTER — Ambulatory Visit: Payer: Medicare Other | Admitting: Vascular Surgery

## 2012-10-11 ENCOUNTER — Emergency Department (HOSPITAL_COMMUNITY): Payer: Medicare Other

## 2012-10-11 ENCOUNTER — Inpatient Hospital Stay (HOSPITAL_COMMUNITY)
Admission: EM | Admit: 2012-10-11 | Discharge: 2012-10-16 | DRG: 638 | Disposition: A | Discharge status: Home / Self Care | Payer: Medicare Other | Attending: Internal Medicine | Admitting: Internal Medicine

## 2012-10-11 DIAGNOSIS — N186 End stage renal disease: Secondary | ICD-10-CM | POA: Diagnosis present

## 2012-10-11 DIAGNOSIS — D631 Anemia in chronic kidney disease: Secondary | ICD-10-CM | POA: Diagnosis present

## 2012-10-11 DIAGNOSIS — E1169 Type 2 diabetes mellitus with other specified complication: Principal | ICD-10-CM | POA: Diagnosis present

## 2012-10-11 DIAGNOSIS — M109 Gout, unspecified: Secondary | ICD-10-CM | POA: Diagnosis present

## 2012-10-11 DIAGNOSIS — I10 Essential (primary) hypertension: Secondary | ICD-10-CM

## 2012-10-11 DIAGNOSIS — D72829 Elevated white blood cell count, unspecified: Secondary | ICD-10-CM

## 2012-10-11 DIAGNOSIS — Z992 Dependence on renal dialysis: Secondary | ICD-10-CM

## 2012-10-11 DIAGNOSIS — J811 Chronic pulmonary edema: Secondary | ICD-10-CM

## 2012-10-11 DIAGNOSIS — J9601 Acute respiratory failure with hypoxia: Secondary | ICD-10-CM

## 2012-10-11 DIAGNOSIS — N2581 Secondary hyperparathyroidism of renal origin: Secondary | ICD-10-CM | POA: Diagnosis present

## 2012-10-11 DIAGNOSIS — I120 Hypertensive chronic kidney disease with stage 5 chronic kidney disease or end stage renal disease: Secondary | ICD-10-CM | POA: Diagnosis present

## 2012-10-11 DIAGNOSIS — N039 Chronic nephritic syndrome with unspecified morphologic changes: Secondary | ICD-10-CM | POA: Diagnosis present

## 2012-10-11 DIAGNOSIS — E162 Hypoglycemia, unspecified: Secondary | ICD-10-CM | POA: Diagnosis present

## 2012-10-11 DIAGNOSIS — E1129 Type 2 diabetes mellitus with other diabetic kidney complication: Secondary | ICD-10-CM | POA: Diagnosis present

## 2012-10-11 DIAGNOSIS — Z833 Family history of diabetes mellitus: Secondary | ICD-10-CM

## 2012-10-11 DIAGNOSIS — M25462 Effusion, left knee: Secondary | ICD-10-CM

## 2012-10-11 DIAGNOSIS — N185 Chronic kidney disease, stage 5: Secondary | ICD-10-CM

## 2012-10-11 DIAGNOSIS — E669 Obesity, unspecified: Secondary | ICD-10-CM | POA: Diagnosis present

## 2012-10-11 DIAGNOSIS — Z79899 Other long term (current) drug therapy: Secondary | ICD-10-CM

## 2012-10-11 HISTORY — DX: Obesity, unspecified: E66.9

## 2012-10-11 HISTORY — DX: End stage renal disease: N18.6

## 2012-10-11 HISTORY — DX: End stage renal disease: Z99.2

## 2012-10-11 LAB — CBC WITH DIFFERENTIAL/PLATELET
Basophils Relative: 0 % (ref 0–1)
HCT: 35.3 % — ABNORMAL LOW (ref 39.0–52.0)
Hemoglobin: 11.2 g/dL — ABNORMAL LOW (ref 13.0–17.0)
Lymphocytes Relative: 19 % (ref 12–46)
MCHC: 31.7 g/dL (ref 30.0–36.0)
MCV: 105.4 fL — ABNORMAL HIGH (ref 78.0–100.0)
Monocytes Absolute: 0.8 10*3/uL (ref 0.1–1.0)
Monocytes Relative: 13 % — ABNORMAL HIGH (ref 3–12)
Neutro Abs: 4 10*3/uL (ref 1.7–7.7)

## 2012-10-11 LAB — BASIC METABOLIC PANEL
BUN: 39 mg/dL — ABNORMAL HIGH (ref 6–23)
CO2: 24 mEq/L (ref 19–32)
Chloride: 100 mEq/L (ref 96–112)
Creatinine, Ser: 7.19 mg/dL — ABNORMAL HIGH (ref 0.50–1.35)

## 2012-10-11 LAB — GLUCOSE, CAPILLARY: Glucose-Capillary: 26 mg/dL — CL (ref 70–99)

## 2012-10-11 MED ORDER — DEXTROSE 50 % IV SOLN
INTRAVENOUS | Status: AC
  Administered 2012-10-11: 50 mL via INTRAVENOUS
  Filled 2012-10-11: qty 50

## 2012-10-11 MED ORDER — DEXTROSE 50 % IV SOLN
INTRAVENOUS | Status: AC
  Filled 2012-10-11: qty 50

## 2012-10-11 MED ORDER — SODIUM CHLORIDE 0.9 % IV SOLN
Freq: Once | INTRAVENOUS | Status: AC
Start: 2012-10-11 — End: 2012-10-11
  Administered 2012-10-11: via INTRAVENOUS

## 2012-10-11 MED ORDER — DEXTROSE 50 % IV SOLN: 1.0000 | Freq: Once | INTRAVENOUS | Status: DC

## 2012-10-11 MED ORDER — DEXTROSE 50 % IV SOLN
1.0000 | Freq: Once | INTRAVENOUS | Status: AC
Start: 2012-10-11 — End: 2012-10-11
  Administered 2012-10-11: 50 mL via INTRAVENOUS
  Filled 2012-10-11: qty 50

## 2012-10-11 MED ORDER — SODIUM CHLORIDE 0.9 % IV BOLUS (SEPSIS)
500.0000 mL | Freq: Once | INTRAVENOUS | Status: AC
  Administered 2012-10-11: 500 mL via INTRAVENOUS

## 2012-10-11 MED ORDER — DEXTROSE 50 % IV SOLN
50.0000 mL | Freq: Once | INTRAVENOUS | Status: AC
  Administered 2012-10-11: 50 mL via INTRAVENOUS

## 2012-10-11 MED ORDER — DEXTROSE 50 % IV SOLN
50.0000 mL | Freq: Once | INTRAVENOUS | Status: AC
Start: 2012-10-11 — End: 2012-10-11
  Administered 2012-10-11: 50 mL via INTRAVENOUS

## 2012-10-11 NOTE — ED Notes (Signed)
Patient transported to X-ray 

## 2012-10-11 NOTE — ED Notes (Signed)
MD at bedside. 

## 2012-10-11 NOTE — ED Notes (Signed)
Pt presents to ED reports low blood sugar, pt has loss of bladder, pt warm to touch. EDP Bednar in triage w/pt. Pt unable to give a good report. Grandson dropped pt off at the ED. Pt reports having a PB&J sandwich only today.

## 2012-10-11 NOTE — ED Notes (Signed)
Lab at bedside

## 2012-10-11 NOTE — ED Notes (Signed)
RN aware of BS: IV being attempted.

## 2012-10-11 NOTE — ED Provider Notes (Signed)
History     CSN: [id number]  Arrival date & time 10/11/12  1924   First MD Initiated Contact with Patient 10/11/12 1938      Chief Complaint  Patient presents with  . Hypoglycemia    (Consider location/radiation/quality/duration/timing/severity/associated sxs/prior treatment) HPI 66 year old male has a history of diabetes as well as end-stage renal disease and is a dialysis patient, he states he ate one or 2 by mouth jelly sandwiches today and that was it, he feels generally weak and feels that his blood sugar is low, he was dropped off the emergency department by family, he denies any pain, he denies fever, denies confusion, denies headache, denies any change in speech vision swallowing or understanding, denies chest pain shortness breath abdominal pain or vomiting, denies any lateralizing weakness or numbness just complains of generalized weakness with a near fainting feeling. There is no treatment prior to arrival. He does not take insulin but does take diabetes pills. Past Medical History  Diagnosis Date  . Renal failure   . Diabetes mellitus without complication     Past Surgical History  Procedure Date  . Insertion of dialysis catheter 09/15/2012    Procedure: INSERTION OF DIALYSIS CATHETER;  Surgeon: Chuck Hint, MD;  Location: [facility] OR;  Service: Vascular;  Laterality: N/A;    No family history on file.  History  Substance Use Topics  . Smoking status: Never Smoker   . Smokeless tobacco: Not on file  . Alcohol Use: No      Review of Systems 10 Systems reviewed and are negative for acute change except as noted in the HPI. Allergies  Review of patient's allergies indicates no known allergies.  Home Medications   Current Outpatient Rx  Name  Route  Sig  Dispense  Refill  . CALCITRIOL 0.5 MCG PO CAPS   Oral   Take 0.5 mcg by mouth daily.         Marland Kitchen GLIPIZIDE ER 5 MG PO TB24   Oral   Take 5 mg by mouth daily.         Marland Kitchen RENA-VITE PO TABS   Oral  Take 1 tablet by mouth daily.   30 tablet   0   . OXYCODONE HCL 5 MG PO TABS   Oral   Take 1 tablet (5 mg total) by mouth every 6 (six) hours as needed.   30 tablet   0   . SEVELAMER CARBONATE 800 MG PO TABS   Oral   Take 800 mg by mouth 3 (three) times daily with meals.           BP 142/73  Pulse 54  Temp 101.7 F (38.7 C) (Oral)  Resp 22  SpO2 94%  Physical Exam  Nursing note and vitals reviewed. Constitutional: He is oriented to person, place, and time.       Awake, alert, but tired appearance with slightly slow speech.  HENT:  Head: Atraumatic.  Mouth/Throat: No oropharyngeal exudate.  Eyes: EOM are normal. Pupils are equal, round, and reactive to light. Right eye exhibits no discharge. Left eye exhibits no discharge.  Neck: Neck supple.  Cardiovascular: Normal rate and regular rhythm.   No murmur heard. Pulmonary/Chest: Effort normal and breath sounds normal. No stridor. No respiratory distress. He has no wheezes. He has no rales. He exhibits no tenderness.  Abdominal: Soft. Bowel sounds are normal. He exhibits no mass. There is no tenderness. There is no rebound.  Musculoskeletal: He exhibits no tenderness.  Baseline ROM, moves extremities with no obvious new focal weakness.  Lymphadenopathy:    He has no cervical adenopathy.  Neurological: He is alert and oriented to person, place, and time.       Awake, alert, cooperative and aware of situation; motor strength 4/5 bilaterally; sensation normal to light touch bilaterally; peripheral visual fields full to confrontation; no facial asymmetry; tongue midline; major cranial nerves appear intact; no pronator drift, normal finger to nose bilaterally  Skin: No rash noted.  Psychiatric: He has a normal mood and affect.    ED Course  Procedures (including critical care time) Pt still awake and alert after D50, repeated after repeat glucose 64, Pt started coughing in ED, developed tachycardia and noted to have fever  so CXR ordered and labs pnd including POC lactate. Labs Reviewed  GLUCOSE, CAPILLARY - Abnormal; Notable for the following:    Glucose-Capillary 26 (*)     All other components within normal limits  CBC WITH DIFFERENTIAL  BASIC METABOLIC PANEL   No results found.   Dx: Hypoglycemia Fever Diabetes Mellitis Additional possible diagnoses pending Pt's disposition   MDM  Pt's care endorsed to Dr. Ethelda Chick with dispo pending.        Hurman Horn, MD 10/12/12 [phone]

## 2012-10-11 NOTE — ED Notes (Signed)
Saline infusion started for IV dextrose administration. Rate set at [facility].

## 2012-10-11 NOTE — ED Notes (Signed)
Patient transported from X-ray 

## 2012-10-11 NOTE — ED Notes (Signed)
Meal tray given 

## 2012-10-12 ENCOUNTER — Observation Stay (HOSPITAL_COMMUNITY): Payer: Medicare Other

## 2012-10-12 ENCOUNTER — Encounter (HOSPITAL_COMMUNITY): Payer: Self-pay | Admitting: Emergency Medicine

## 2012-10-12 DIAGNOSIS — E162 Hypoglycemia, unspecified: Secondary | ICD-10-CM | POA: Diagnosis present

## 2012-10-12 DIAGNOSIS — E669 Obesity, unspecified: Secondary | ICD-10-CM

## 2012-10-12 LAB — URINALYSIS, ROUTINE W REFLEX MICROSCOPIC
Glucose, UA: 100 mg/dL — AB
pH: 8 (ref 5.0–8.0)

## 2012-10-12 LAB — CBC
HCT: 35.8 % — ABNORMAL LOW (ref 39.0–52.0)
Hemoglobin: 11.3 g/dL — ABNORMAL LOW (ref 13.0–17.0)
MCH: 32.8 pg (ref 26.0–34.0)
MCHC: 31.6 g/dL (ref 30.0–36.0)
MCV: 104.1 fL — ABNORMAL HIGH (ref 78.0–100.0)
RDW: 16.4 % — ABNORMAL HIGH (ref 11.5–15.5)

## 2012-10-12 LAB — GLUCOSE, CAPILLARY
Glucose-Capillary: 107 mg/dL — ABNORMAL HIGH (ref 70–99)
Glucose-Capillary: 111 mg/dL — ABNORMAL HIGH (ref 70–99)
Glucose-Capillary: 177 mg/dL — ABNORMAL HIGH (ref 70–99)
Glucose-Capillary: 215 mg/dL — ABNORMAL HIGH (ref 70–99)
Glucose-Capillary: 47 mg/dL — ABNORMAL LOW (ref 70–99)
Glucose-Capillary: 49 mg/dL — ABNORMAL LOW (ref 70–99)
Glucose-Capillary: 52 mg/dL — ABNORMAL LOW (ref 70–99)
Glucose-Capillary: 74 mg/dL (ref 70–99)
Glucose-Capillary: 83 mg/dL (ref 70–99)
Glucose-Capillary: 96 mg/dL (ref 70–99)

## 2012-10-12 LAB — BASIC METABOLIC PANEL
BUN: 41 mg/dL — ABNORMAL HIGH (ref 6–23)
Calcium: 10.2 mg/dL (ref 8.4–10.5)
Creatinine, Ser: 7.91 mg/dL — ABNORMAL HIGH (ref 0.50–1.35)
GFR calc Af Amer: 7 mL/min — ABNORMAL LOW (ref >90)
GFR calc non Af Amer: 6 mL/min — ABNORMAL LOW (ref >90)
Glucose, Bld: 83 mg/dL (ref 70–99)

## 2012-10-12 LAB — URINE MICROSCOPIC-ADD ON

## 2012-10-12 LAB — HEMOGLOBIN A1C: Hgb A1c MFr Bld: 5.6 % (ref <5.7)

## 2012-10-12 LAB — MRSA PCR SCREENING: MRSA by PCR: NEGATIVE

## 2012-10-12 LAB — HEPATITIS B SURFACE ANTIGEN: Hepatitis B Surface Ag: NEGATIVE

## 2012-10-12 MED ORDER — ALTEPLASE 2 MG IJ SOLR: 2.0000 mg | Freq: Once | INTRAMUSCULAR | Status: DC | PRN

## 2012-10-12 MED ORDER — DEXTROSE 50 % IV SOLN
INTRAVENOUS | Status: AC
  Administered 2012-10-12: 50 mL via INTRAVENOUS
  Filled 2012-10-12: qty 50

## 2012-10-12 MED ORDER — ALUM & MAG HYDROXIDE-SIMETH 200-200-20 MG/5ML PO SUSP: 30.0000 mL | Freq: Four times a day (QID) | ORAL | Status: DC | PRN

## 2012-10-12 MED ORDER — ONDANSETRON HCL 4 MG/2ML IJ SOLN: 4.0000 mg | Freq: Four times a day (QID) | INTRAMUSCULAR | Status: DC | PRN

## 2012-10-12 MED ORDER — RENA-VITE PO TABS
1.0000 | ORAL_TABLET | Freq: Every day | ORAL | Status: DC
  Administered 2012-10-12 – 2012-10-16 (×5): 1 via ORAL
  Filled 2012-10-12 (×5): qty 1

## 2012-10-12 MED ORDER — PENTAFLUOROPROP-TETRAFLUOROETH EX AERO: 1.0000 "application " | INHALATION_SPRAY | CUTANEOUS | Status: DC | PRN

## 2012-10-12 MED ORDER — OXYCODONE HCL 5 MG PO TABS
ORAL_TABLET | ORAL | Status: AC
  Administered 2012-10-12: 5 mg via ORAL
  Filled 2012-10-12: qty 1

## 2012-10-12 MED ORDER — DEXTROSE 50 % IV SOLN
25.0000 mL | Freq: Once | INTRAVENOUS | Status: AC | PRN
  Administered 2012-10-12: 25 mL via INTRAVENOUS

## 2012-10-12 MED ORDER — DEXTROSE 50 % IV SOLN
50.0000 mL | Freq: Once | INTRAVENOUS | Status: AC | PRN
  Administered 2012-10-12: 50 mL via INTRAVENOUS

## 2012-10-12 MED ORDER — HEPARIN SODIUM (PORCINE) 1000 UNIT/ML DIALYSIS: 1000.0000 [IU] | INTRAMUSCULAR | Status: DC | PRN

## 2012-10-12 MED ORDER — SEVELAMER CARBONATE 800 MG PO TABS
800.0000 mg | ORAL_TABLET | Freq: Three times a day (TID) | ORAL | Status: DC
  Administered 2012-10-12 – 2012-10-16 (×13): 800 mg via ORAL
  Filled 2012-10-12 (×16): qty 1

## 2012-10-12 MED ORDER — INSULIN ASPART 100 UNIT/ML ~~LOC~~ SOLN: 0.0000 [IU] | SUBCUTANEOUS | Status: DC

## 2012-10-12 MED ORDER — DEXTROSE-NACL 5-0.45 % IV SOLN
INTRAVENOUS | Status: DC
  Administered 2012-10-12: 03:00:00 via INTRAVENOUS
  Administered 2012-10-12: 20 mL via INTRAVENOUS
  Administered 2012-10-13: 21:00:00 via INTRAVENOUS

## 2012-10-12 MED ORDER — INFLUENZA VIRUS VACC SPLIT PF IM SUSP
0.5000 mL | INTRAMUSCULAR | Status: AC
  Administered 2012-10-14: 0.5 mL via INTRAMUSCULAR
  Filled 2012-10-12: qty 0.5

## 2012-10-12 MED ORDER — ACETAMINOPHEN 325 MG PO TABS
975.0000 mg | ORAL_TABLET | Freq: Once | ORAL | Status: AC
  Administered 2012-10-12: 975 mg via ORAL
  Filled 2012-10-12: qty 3

## 2012-10-12 MED ORDER — HEPARIN SODIUM (PORCINE) 5000 UNIT/ML IJ SOLN
5000.0000 [IU] | Freq: Three times a day (TID) | INTRAMUSCULAR | Status: DC
  Administered 2012-10-12 – 2012-10-15 (×11): 5000 [IU] via SUBCUTANEOUS
  Filled 2012-10-12 (×16): qty 1

## 2012-10-12 MED ORDER — NEPRO/CARBSTEADY PO LIQD: 237.0000 mL | ORAL | Status: DC | PRN

## 2012-10-12 MED ORDER — DOCUSATE SODIUM 100 MG PO CAPS
100.0000 mg | ORAL_CAPSULE | Freq: Every day | ORAL | Status: DC | PRN
  Filled 2012-10-12: qty 1

## 2012-10-12 MED ORDER — ACETAMINOPHEN 325 MG PO TABS: 650.0000 mg | ORAL_TABLET | Freq: Four times a day (QID) | ORAL | Status: DC | PRN

## 2012-10-12 MED ORDER — LIDOCAINE HCL (PF) 1 % IJ SOLN: 5.0000 mL | INTRAMUSCULAR | Status: DC | PRN

## 2012-10-12 MED ORDER — INSULIN ASPART 100 UNIT/ML ~~LOC~~ SOLN: 0.0000 [IU] | Freq: Every day | SUBCUTANEOUS | Status: DC

## 2012-10-12 MED ORDER — ONDANSETRON HCL 4 MG PO TABS: 4.0000 mg | ORAL_TABLET | Freq: Four times a day (QID) | ORAL | Status: DC | PRN

## 2012-10-12 MED ORDER — DEXTROSE 5 % IV SOLN
1.0000 g | Freq: Once | INTRAVENOUS | Status: AC
  Administered 2012-10-12: 1 g via INTRAVENOUS
  Filled 2012-10-12: qty 10

## 2012-10-12 MED ORDER — ACETAMINOPHEN 650 MG RE SUPP: 650.0000 mg | Freq: Four times a day (QID) | RECTAL | Status: DC | PRN

## 2012-10-12 MED ORDER — MORPHINE SULFATE 2 MG/ML IJ SOLN
2.0000 mg | Freq: Once | INTRAMUSCULAR | Status: AC
  Administered 2012-10-12: 2 mg via INTRAVENOUS
  Filled 2012-10-12: qty 1

## 2012-10-12 MED ORDER — LIDOCAINE-PRILOCAINE 2.5-2.5 % EX CREA: 1.0000 "application " | TOPICAL_CREAM | CUTANEOUS | Status: DC | PRN

## 2012-10-12 MED ORDER — SODIUM CHLORIDE 0.9 % IV SOLN: 100.0000 mL | INTRAVENOUS | Status: DC | PRN

## 2012-10-12 MED ORDER — HEPARIN SODIUM (PORCINE) 1000 UNIT/ML DIALYSIS
20.0000 [IU]/kg | Freq: Once | INTRAMUSCULAR | Status: AC
  Administered 2012-10-12: 2600 [IU] via INTRAVENOUS_CENTRAL

## 2012-10-12 MED ORDER — DEXTROSE 50 % IV SOLN
INTRAVENOUS | Status: AC
  Filled 2012-10-12: qty 50

## 2012-10-12 MED ORDER — LEVOFLOXACIN 500 MG PO TABS
500.0000 mg | ORAL_TABLET | ORAL | Status: DC
  Administered 2012-10-12 – 2012-10-16 (×3): 500 mg via ORAL
  Filled 2012-10-12 (×3): qty 1

## 2012-10-12 MED ORDER — AMITRIPTYLINE HCL 25 MG PO TABS
25.0000 mg | ORAL_TABLET | Freq: Every day | ORAL | Status: DC
  Administered 2012-10-12 – 2012-10-15 (×4): 25 mg via ORAL
  Filled 2012-10-12 (×5): qty 1

## 2012-10-12 MED ORDER — OXYCODONE HCL 5 MG PO TABS
5.0000 mg | ORAL_TABLET | Freq: Four times a day (QID) | ORAL | Status: DC | PRN
  Administered 2012-10-12 – 2012-10-16 (×5): 5 mg via ORAL
  Filled 2012-10-12 (×4): qty 1

## 2012-10-12 MED ORDER — SODIUM BICARBONATE 650 MG PO TABS
1300.0000 mg | ORAL_TABLET | Freq: Two times a day (BID) | ORAL | Status: DC
  Administered 2012-10-12 (×2): 1300 mg via ORAL
  Filled 2012-10-12 (×3): qty 2

## 2012-10-12 MED ORDER — NIFEDIPINE ER OSMOTIC RELEASE 90 MG PO TB24
90.0000 mg | ORAL_TABLET | Freq: Every day | ORAL | Status: DC
  Administered 2012-10-12 – 2012-10-16 (×5): 90 mg via ORAL
  Filled 2012-10-12 (×5): qty 1

## 2012-10-12 NOTE — H&P (Signed)
PCP:   None Nephrologist: Dr. Lowell Guitar   Chief Complaint:  Low sugars  HPI: This is a 66 year old gentleman with history of end-stage renal disease and diabetes mellitus. Today at home he routinely checked his blood sugars and found to be 49. He ate sweets, drank  juices and soda, he states he did not help his low sugars therefore, he called 911. He states he felt weak and nervous but he was never diaphoretic or confused. The patient's fingerstick blood sugars was 26 on arrival to the ER. He received dextrose but later his his glucose again declined to 54. He was again given dextrose,the hospitalist service requested to admit. The patient has end-stage renal disease, he started dialysis approximately 3 weeks ago. He has a hemodialysis catheter the right chest wall. He denies any fevers, chills, nausea, vomiting, diarrhea, burning or urination. Patient states he makes good urine.  Review of Systems:  The patient denies anorexia, fever, weight loss,, vision loss, decreased hearing, hoarseness, chest pain, syncope, dyspnea on exertion, peripheral edema, balance deficits, hemoptysis, abdominal pain, melena, hematochezia, severe indigestion/heartburn, hematuria, incontinence, genital sores, muscle weakness, suspicious skin lesions, transient blindness, difficulty walking, depression, unusual weight change, abnormal bleeding, enlarged lymph nodes, angioedema, and breast masses.  Past Medical History: Past Medical History  Diagnosis Date  . Diabetes mellitus without complication   . Renal failure    Past Surgical History  Procedure Date  . Insertion of dialysis catheter 09/15/2012    Procedure: INSERTION OF DIALYSIS CATHETER;  Surgeon: Chuck Hint, MD;  [facility];  Service: Vascular;  Laterality: N/A;    Medications: Prior to Admission medications   Medication Sig Start Date End Date Taking? Authorizing Provider  amitriptyline (ELAVIL) 25 MG tablet Take 25 mg by mouth at bedtime.    Yes Historical Provider, MD  docusate sodium (COLACE) 100 MG capsule Take 100 mg by mouth daily as needed. For hard stool   Yes Historical Provider, MD  glipiZIDE (GLUCOTROL XL) 5 MG 24 hr tablet Take 5 mg by mouth daily.   Yes Historical Provider, MD  multivitamin (RENA-VIT) TABS tablet Take 1 tablet by mouth daily. 09/19/12  Yes Zannie Cove, MD  NIFEdipine (PROCARDIA XL/ADALAT-CC) 90 MG 24 hr tablet Take 1 tablet by mouth daily. 08/26/12  Yes Historical Provider, MD  oxyCODONE (OXY IR/ROXICODONE) 5 MG immediate release tablet Take 1 tablet (5 mg total) by mouth every 6 (six) hours as needed. 09/19/12  Yes Zannie Cove, MD  sevelamer (RENVELA) 800 MG tablet Take 800 mg by mouth 3 (three) times daily with meals.   Yes Historical Provider, MD  sodium bicarbonate 650 MG tablet Take 1,300 mg by mouth 2 (two) times daily.   Yes Historical Provider, MD    Allergies:  No Known Allergies  Social History:  reports that he has never smoked. He does not have any smokeless tobacco history on file. He reports that he does not drink alcohol or use illicit drugs.  Family History: Diabetes mellitus, hypertension  Physical Exam: Filed Vitals:   10/11/12 1926 10/11/12 2200 10/11/12 2227 10/11/12 2230  BP: 142/73 149/105 123/75 123/75  Pulse: 54 115 104 114  Temp: 101.7 F (38.7 C)  100.4 F (38 C)   TempSrc: Oral  Oral   Resp: 22  22   SpO2: 94% 96% 95% 92%    General:  Alert and oriented times three, obese, no acute distress Eyes: PERRLA, pink conjunctiva, no scleral icterus ENT: Moist oral mucosa, neck supple, no thyromegaly  Lungs: clear to ascultation, no wheeze, no crackles, no use of accessory muscles Cardiovascular: regular rate and rhythm, no regurgitation, no gallops, no murmurs. No carotid bruits, no JVD Abdomen: soft, positive BS, non-tender, non-distended, no organomegaly, not an acute abdomen GU: not examined Neuro: CN II - XII grossly intact, sensation intact Musculoskeletal:  strength 5/5 all extremities, no clubbing, cyanosis or edema, Mediport right chest wall no evidence of infection or drainage  Skin: no rash, no subcutaneous crepitation, no decubitus Psych: appropriate patient   Labs on Admission:   [facility] 10/11/12 1956  NA 139  K 3.9  CL 100  CO2 24  GLUCOSE 83  BUN 39*  CREATININE 7.19*  CALCIUM 10.3  MG --  PHOS --   No results found for this basename: AST:2,ALT:2,ALKPHOS:2,BILITOT:2,PROT:2,ALBUMIN:2 in the last 72 hours No results found for this basename: LIPASE:2,AMYLASE:2 in the last 72 hours  Basename 10/11/12 1956  WBC 6.0  NEUTROABS 4.0  HGB 11.2*  HCT 35.3*  MCV 105.4*  PLT 230    Micro Results: Results for DEADRICK, STIDD (MRN [id number]) as of 10/12/2012 02:22  Ref. Range 10/12/2012 00:09  Color, Urine Latest Range: YELLOW  YELLOW  APPearance Latest Range: CLEAR  CLEAR  Specific Gravity, Urin Latest Range: 1.005-1.030  1.008  pH Latest Range: 5.0-8.0  8.0  Glucose Latest Range: NEGATIVE mg/dL 409 (A)  Bilirubin Urine Latest Range: NEGATIVE  NEGATIVE  Ketones, ur Latest Range: NEGATIVE mg/dL NEGATIVE  Protein Latest Range: NEGATIVE mg/dL 811 (A)  Urobilinogen, UA Latest Range: 0.0-1.0 mg/dL 0.2  Nitrite Latest Range: NEGATIVE  NEGATIVE  Leukocytes, UA Latest Range: NEGATIVE  TRACE (A)  Hgb urine dipstick Latest Range: NEGATIVE  MODERATE (A)  WBC, UA Latest Range: <3 WBC/hpf 7-10  RBC / HPF Latest Range: <3 RBC/hpf 3-6  Squamous Epithelial / LPF Latest Range: RARE  RARE  Bacteria, UA Latest Range: RARE  RARE     Radiological Exams on Admission: Dg Chest 2 View  10/11/2012  *RADIOLOGY REPORT*  Clinical Data: Hypoglycemia; weakness.  Left arm and leg pain. Cough.  CHEST - 2 VIEW  Comparison: Chest radiograph performed 09/15/2012  Findings: The lungs are well-aerated.  Vascular congestion is noted.  Mild right basilar airspace opacity could reflect mild interstitial edema.  There is no evidence of pleural effusion or  pneumothorax.  The heart is borderline enlarged.  A right-sided dual lumen catheter is noted ending about the distal SVC.  No acute osseous abnormalities are seen.  IMPRESSION: Vascular congestion and borderline cardiomegaly; mild right basilar airspace opacity could reflect mild interstitial edema.   Original Report Authenticated By: Tonia Ghent, M.D.     Assessment/Plan Present on Admission:  . Hypoglycemia Bring in for 23 hour observation Fingerstick blood sugars every 2 hours with glucagon to be given if needed  ADA diet No clear-cut evidence of infection that could be causing this, patient's urine with 7-10 WBC count but rare bacteria. Will give a single dose of Rocephin will await culture Blood cultures collected as patient has a hemodialysis catheter and is hypoglycemic. No clear evidence of infection again no leukocytosis or fevers. No antibiotics started Patient's home oral hypoglycemic medications held, may need to continue holding some of these after discharge  . ESRD on dialysis Stable resume home medication   Full code DVT prophylaxis  Jamya Starry 10/12/2012, 2:21 AM

## 2012-10-12 NOTE — Progress Notes (Signed)
Hypoglycemic Event  CBG: 35  Treatment: D50 IV 50 mL  Symptoms: None  Follow-up CBG: Time:550 CBG Result107  Possible Reasons for Event: Inadequate meal intake  Comments/MD notified:Schorr    Allena Earing  Remember to initiate Hypoglycemia Order Set & complete

## 2012-10-12 NOTE — Progress Notes (Signed)
Hypoglycemic Event  CBG: 52  Treatment: D50 IV 25 mL  Symptoms: None  Follow-up CBG: Time:330 CBG Result:90  Possible Reasons for Event: Inadequate meal intake  Comments/MD notified:Crosely, D    Allena Earing  Remember to initiate Hypoglycemia Order Set & complete

## 2012-10-12 NOTE — ED Notes (Signed)
Error in charting - Report given to Dora on 5500.

## 2012-10-12 NOTE — Progress Notes (Signed)
Pt refused morning labs because he claims that today is his dialysis day so he does not want them done. He also claims that he no longer can take heprin because it makes his nose bleed.

## 2012-10-12 NOTE — Progress Notes (Signed)
Hypoglycemic Event  CBG: 47  Treatment: D50 IV 50 mL  Symptoms: None  Follow-up CBG: Time 0810 CBG Result:111  Possible Reasons for Event: Unknown  Comments/MD [id number]   Cindra Eves  Remember to initiate Hypoglycemia Order Set & complete

## 2012-10-12 NOTE — ED Provider Notes (Addendum)
Patient seen by me, sinus to me by Dr. Fonnie Jarvis complained of weakness onset today and cough for the past several months. Noted to be febrile. On exam alert no distress lungs diffuse scant crackles abdomen obese nontender extremities nontender, neurovascular intact chest with dialysis catheter in place. Patient given a meal. Blood sugar subsequently dropped to 54 after eating, administered D50 intravenously, my order consistent with hypoglycemia.  Medical decision making in light of patient's recurrent hypoglycemia and on glipizide will admit her placed in 23 our observation to monitor blood sugar.Chest xray reviewed by me Doug Sou, MD 10/12/12 8657  Doug Sou, MD 10/12/12 8469

## 2012-10-12 NOTE — Progress Notes (Signed)
  Pt admitted to the unit. Pt is stable, alert and oriented per baseline. Oriented to room, staff, and call bell. Educated to call for any assistance. Bed in lowest position, call bell within reach- will continue to monitor. 

## 2012-10-12 NOTE — Care Management Note (Signed)
    Page 1 of 2   10/16/2012     4:23:00 PM   CARE MANAGEMENT NOTE 10/16/2012  Patient:  Nicolas Mccann, Nicolas Mccann   Account Number:  [id number]  Date Initiated:  10/12/2012  Documentation initiated by:  Letha Cape  Subjective/Objective Assessment:   dx hypoglycemia  admit - lives with spouse.     Action/Plan:   Anticipated DC Date:  10/16/2012   Anticipated DC Plan:  HOME/SELF CARE      DC Planning Services  CM consult      PAC Choice  Resumption Of Svcs/PTA Provider   Choice offered to / List presented to:  C-1 Patient        HH arranged  HH-2 PT      HH agency  [facility]   Status of service:  Completed, signed off Medicare Important Message given?   (If response is "NO", the following Medicare IM given date fields will be blank) Date Medicare IM given:   Date Additional Medicare IM given:    Discharge Disposition:  HOME W HOME HEALTH SERVICES  Per UR Regulation:  Reviewed for med. necessity/level of care/duration of stay  If discussed at Long Length of Stay Meetings, dates discussed:    Comments:  10/16/12 16:20 Letha Cape RN,BSN [phone] patient for dc today after vascular procedure. Patient has medication coverage and transportation.  Patient is active with [facility] for hhpt and would like to resume, will need resume orders.  [facility] patient for dc today.  10/12/12 15:35 Letha Cape RN, BSN [phone] patient lives with spouse, pt is a HD patient, just started HD about 3 weeks ago.  NCM will continue to follow for dc needs.

## 2012-10-12 NOTE — Progress Notes (Signed)
PATIENT DETAILS Name: Nicolas Mccann Age: 66 y.o. Sex: male Date of Birth: 01-Nov-1946 Admit Date: 10/11/2012 Admitting Physician Gery Pray, MD ZOX:[id number],[id number], MD  Subjective: Awake and alert-admitted with Hypoglycemia  Assessment/Plan: Active Problems: Hypoglycemia -2/2 to use of long acting Glipizide in the setting of ESRD -repeat A1C-if low-maybe we can just watch him off any medications -for now-as need D50-if still persistent will try octreotide -monitor CBG's every 2 hours-till this resolved  HTN -controlled -c/w Procardia  ESRD -due to HD today -have informed renal     Obesity -counseled regarding weight loss  Metabolic bone disease -per renal   Disposition: Remain inpatient  DVT Prophylaxis: Prophylactic  Heparin  Code Status: Full code  Procedures:  None  CONSULTS:  nephrology  PHYSICAL EXAM: Vital signs in last 24 hours: Filed Vitals:   10/11/12 2227 10/11/12 2230 10/12/12 0236 10/12/12 0505  BP: 123/75 123/75 128/68 154/85  Pulse: 104 114 107 102  Temp: 100.4 F (38 C)  99.1 F (37.3 C) 98.5 F (36.9 C)  TempSrc: Oral  Oral Oral  Resp: 22  24 20   Height:   6\' 3"  (1.905 m)   Weight:   130.3 kg (287 lb 4.2 oz)   SpO2: 95% 92% 92% 97%    Weight change:  Body mass index is 35.90 kg/(m^2).   Gen Exam: Awake and alert with clear speech.  Neck: Supple, No JVD.   Chest: B/L Clear.   CVS: S1 S2 Regular, no murmurs.  Abdomen: soft, BS +, non tender, non distended.  Extremities: no edema, lower extremities warm to touch. Neurologic: Non Focal.   Skin: No Rash.   Wounds: N/A.    Intake/Output from previous day:  Intake/Output Summary (Last 24 hours) at 10/12/12 1203 Last data filed at 10/12/12 1044  Gross per 24 hour  Intake   1600 ml  Output    830 ml  Net    770 ml     LAB RESULTS: CBC  Lab 10/12/12 0938 10/11/12 1956  WBC 6.0 6.0  HGB 11.3* 11.2*  HCT 35.8* 35.3*  PLT 234 230  MCV 104.1* 105.4*  MCH 32.8 33.4   MCHC 31.6 31.7  RDW 16.4* 16.5*  LYMPHSABS -- 1.1  MONOABS -- 0.8  EOSABS -- 0.1  BASOSABS -- 0.0  BANDABS -- --    Chemistries   Lab 10/12/12 0938 10/11/12 1956  NA 139 139  K 4.5 3.9  CL 101 100  CO2 25 24  GLUCOSE 83 83  BUN 41* 39*  CREATININE 7.91* 7.19*  CALCIUM 10.2 10.3  MG -- --    CBG:  Lab 10/12/12 1026 10/12/12 0914 10/12/12 0809 10/12/12 0736 10/12/12 0559  GLUCAP 82 108* 111* 47* 107*    GFR Estimated Creatinine Clearance: 13.4 ml/min (by C-G formula based on Cr of 7.91).  Coagulation profile No results found for this basename: INR:5,PROTIME:5 in the last 168 hours  Cardiac Enzymes No results found for this basename: CK:3,CKMB:3,TROPONINI:3,MYOGLOBIN:3 in the last 168 hours  No components found with this basename: POCBNP:3 No results found for this basename: DDIMER:2 in the last 72 hours No results found for this basename: HGBA1C:2 in the last 72 hours No results found for this basename: CHOL:2,HDL:2,LDLCALC:2,TRIG:2,CHOLHDL:2,LDLDIRECT:2 in the last 72 hours No results found for this basename: TSH,T4TOTAL,FREET3,T3FREE,THYROIDAB in the last 72 hours No results found for this basename: VITAMINB12:2,FOLATE:2,FERRITIN:2,TIBC:2,IRON:2,RETICCTPCT:2 in the last 72 hours No results found for this basename: LIPASE:2,AMYLASE:2 in the last 72 hours  Urine Studies No results  found for this basename: UACOL:2,UAPR:2,USPG:2,UPH:2,UTP:2,UGL:2,UKET:2,UBIL:2,UHGB:2,UNIT:2,UROB:2,ULEU:2,UEPI:2,UWBC:2,URBC:2,UBAC:2,CAST:2,CRYS:2,UCOM:2,BILUA:2 in the last 72 hours  MICROBIOLOGY: Recent Results (from the past 240 hour(s))  MRSA PCR SCREENING     Status: Normal   Collection Time   10/12/12  6:28 AM      Component Value Range Status Comment   MRSA by PCR NEGATIVE  NEGATIVE Final     RADIOLOGY STUDIES/RESULTS: Dg Chest 1 View  10/12/2012  *RADIOLOGY REPORT*  Clinical Data: Evaluate for fluid overload.  Shortness of breath. End-stage renal disease.  CHEST - 1 VIEW   Comparison: 10/11/2012  Findings: Dialysis catheter unchanged in position at mid and low SVC.  Moderate cardiomegaly. No pleural effusion or pneumothorax. Similar mild pulmonary venous congestion.  Mild thickening right minor fissure.  Improved right base aeration with mild atelectasis remaining.  IMPRESSION: Cardiomegaly with similar mild pulmonary venous congestion.  No overt congestive failure.   Original Report Authenticated By: Jeronimo Greaves, M.D.    Dg Chest 2 View  10/11/2012  *RADIOLOGY REPORT*  Clinical Data: Hypoglycemia; weakness.  Left arm and leg pain. Cough.  CHEST - 2 VIEW  Comparison: Chest radiograph performed 09/15/2012  Findings: The lungs are well-aerated.  Vascular congestion is noted.  Mild right basilar airspace opacity could reflect mild interstitial edema.  There is no evidence of pleural effusion or pneumothorax.  The heart is borderline enlarged.  A right-sided dual lumen catheter is noted ending about the distal SVC.  No acute osseous abnormalities are seen.  IMPRESSION: Vascular congestion and borderline cardiomegaly; mild right basilar airspace opacity could reflect mild interstitial edema.   Original Report Authenticated By: Tonia Ghent, M.D.    Dg Chest 2 View  09/14/2012  *RADIOLOGY REPORT*  Clinical Data: Follow-up edema.  Diabetes and hypertension. Dialysis patient  CHEST - 2 VIEW  Comparison: 09/13/2012  Findings: Cardiomegaly is identified and appears stable. There is persistent pulmonary vascular congestion, small bilateral pleural effusions and fissural fluid identified compatible with mild pulmonary edema.  This is unchanged in comparison with the prior exam.  No new focal infiltrates are identified.  Bony structures demonstrate some degenerative change of the lower thoracic spine.  IMPRESSION: Unchanged pulmonary vascular congestion and mild pulmonary edema pattern.   Original Report Authenticated By: Rhodia Albright, M.D.    Dg Chest Port 1 View  09/15/2012   *RADIOLOGY REPORT*  Clinical Data: Central line placement  PORTABLE CHEST - 1 VIEW  Comparison: Chest radiograph 09/14/2012  Findings: There is interval placement of large bore right central venous line.  Split catheter tips in the distal SVC.  No pneumothorax.  Stable enlarged heart silhouette.  There is central venous congestion and mild pulmonary edema not changed from prior.  IMPRESSION: 1.  Interval placement right central venous line without complication. 2.  Cardiomegaly and mild pulmonary edema.   Original Report Authenticated By: [name], M.D.    Dg Chest Port 1 View  09/13/2012  *RADIOLOGY REPORT*  Clinical Data: Pulmonary edema follow-up  PORTABLE CHEST - 1 VIEW  Comparison: 09/12/2012  Findings: Prominent cardiomediastinal contours.  Central vascular congestion.  Central peribronchial thickening, interstitial, and left greater right perihilar/infrahilar airspace opacities.  No definite pleural effusion.  No pneumothorax.  No interval osseous change.  IMPRESSION: Prominent cardiomediastinal contours with pulmonary edema pattern, similar to prior.   Original Report Authenticated By: Jearld Lesch, M.D.    Dg Chest Portable 1 View  09/12/2012  *RADIOLOGY REPORT*  Clinical Data: Shortness of breath and chest pain after exertion when catches breath, 1 month  in duration, history hypertension, diabetes  PORTABLE CHEST - 1 VIEW  Comparison: Portable exam 1354 hours compared to 09/06/2005  Findings: Enlargement of cardiac silhouette with pulmonary vascular congestion. Mild perihilar infiltrates question edema, infection not excluded. No gross pleural effusion or pneumothorax. Bones unremarkable.  IMPRESSION: Enlargement of cardiac silhouette with pulmonary vascular congestion and mild perihilar infiltrates, slightly greater on left, favor edema over infection.   Original Report Authenticated By: Ulyses Southward, M.D.     MEDICATIONS: Scheduled Meds:   . [COMPLETED] sodium chloride   Intravenous  Once  . [COMPLETED] acetaminophen  975 mg Oral Once  . amitriptyline  25 mg Oral QHS  . [COMPLETED] cefTRIAXone (ROCEPHIN)  IV  1 g Intravenous Once  . [COMPLETED] dextrose  50 mL Intravenous Once  . [COMPLETED] dextrose  50 mL Intravenous Once  . [COMPLETED] dextrose  1 ampule Intravenous Once  . dextrose      . heparin  5,000 Units Subcutaneous Q8H  . influenza  inactive virus vaccine  0.5 mL Intramuscular Tomorrow-1000  . [COMPLETED]  morphine injection  2 mg Intravenous Once  . multivitamin  1 tablet Oral Daily  . NIFEdipine  90 mg Oral Daily  . sevelamer  800 mg Oral TID WC  . sodium bicarbonate  1,300 mg Oral BID  . [COMPLETED] sodium chloride  500 mL Intravenous Once  . [DISCONTINUED] dextrose  1 ampule Intravenous Once  . [DISCONTINUED] insulin aspart  0-15 Units Subcutaneous Q2H  . [DISCONTINUED] insulin aspart  0-5 Units Subcutaneous QHS   Continuous Infusions:   . dextrose 5 % and 0.45% NaCl 40 mL/hr at 10/12/12 0244   PRN Meds:.acetaminophen, acetaminophen, alum & mag hydroxide-simeth, [COMPLETED] dextrose, [COMPLETED] dextrose, [COMPLETED] dextrose, docusate sodium, ondansetron (ZOFRAN) IV, ondansetron, oxyCODONE  Antibiotics: Anti-infectives     Start     Dose/Rate Route Frequency Ordered Stop   10/12/12 0300   cefTRIAXone (ROCEPHIN) 1 g in dextrose 5 % 50 mL IVPB        1 g 100 mL/hr over 30 Minutes Intravenous  Once 10/12/12 0224 10/12/12 Josph Macho, MD  [facility] Pager:[phone]  If 7PM-7AM, please contact night-coverage [email or web address] Password [email or web address] 10/12/2012, 12:03 PM   LOS: 1 day

## 2012-10-12 NOTE — Consult Note (Signed)
Nicolas Mccann 10/12/2012 Kemi Gell D Requesting Physician:  Dr Jerral Ralph  Reason for Consult:  Need for dialysis and related services in patient with ESRD admitted with hypoglycemic episode HPI: The patient is a 66 y.o. year-old with hx of obesity and DM/HTN causing ESRD. He was admitted yesterday for hypoglycemic episode. He is doing better today. He takes glipizide.   Patient just started dialysis on a recent admission for vol excess and pulm edema. During the admission the patient's LUA AVF was attempted for HD but developed infiltration with hematoma. VVS then placed a TDC and did a fistulogram which showed a patent AVF. Their plan was to see pt 3 weeks after d/c to aloow time for the hematoma/swelling to go down, and reassess the fistula for use. Per pt the AVF was originally  placed about 4 years ago, but this was the first time it had been used.  No current complaints.   ROS  no fever, chills  +head and chest cold, yellowish sputum   + cough  no CP or SOB  no n/v/d  no abd pain  makes urine, no dysuria  no joint swelling, +arthritis pains both shoulders and hips  no ankle swelling  no HA, visual chg or confusion   Past Medical History:  Past Medical History  Diagnosis Date  . Diabetes mellitus without complication   . ESRD (end stage renal disease)   . Obesity     Past Surgical History:  Past Surgical History  Procedure Date  . Insertion of dialysis catheter 09/15/2012    Procedure: INSERTION OF DIALYSIS CATHETER;  Surgeon: Chuck Hint, MD;  Location: [facility] OR;  Service: Vascular;  Laterality: N/A;    Family History:  Family History  Problem Relation Age of Onset  . Diabetes Mellitus II    . Hypertension     Social History:  reports that he has never smoked. He does not have any smokeless tobacco history on file. He reports that he does not drink alcohol or use illicit drugs.  Allergies: No Known Allergies  Home medications: Prior to Admission medications    Medication Sig Start Date End Date Taking? Authorizing Provider  amitriptyline (ELAVIL) 25 MG tablet Take 25 mg by mouth at bedtime.   Yes Historical Provider, MD  docusate sodium (COLACE) 100 MG capsule Take 100 mg by mouth daily as needed. For hard stool   Yes Historical Provider, MD  glipiZIDE (GLUCOTROL XL) 5 MG 24 hr tablet Take 5 mg by mouth daily.   Yes Historical Provider, MD  multivitamin (RENA-VIT) TABS tablet Take 1 tablet by mouth daily. 09/19/12  Yes Zannie Cove, MD  NIFEdipine (PROCARDIA XL/ADALAT-CC) 90 MG 24 hr tablet Take 1 tablet by mouth daily. 08/26/12  Yes Historical Provider, MD  oxyCODONE (OXY IR/ROXICODONE) 5 MG immediate release tablet Take 1 tablet (5 mg total) by mouth every 6 (six) hours as needed. 09/19/12  Yes Zannie Cove, MD  sevelamer (RENVELA) 800 MG tablet Take 800 mg by mouth 3 (three) times daily with meals.   Yes Historical Provider, MD  sodium bicarbonate 650 MG tablet Take 1,300 mg by mouth 2 (two) times daily.   Yes Historical Provider, MD    Inpatient medications:    . [COMPLETED] sodium chloride   Intravenous Once  . [COMPLETED] acetaminophen  975 mg Oral Once  . amitriptyline  25 mg Oral QHS  . [COMPLETED] cefTRIAXone (ROCEPHIN)  IV  1 g Intravenous Once  . [COMPLETED] dextrose  50 mL Intravenous  Once  . [COMPLETED] dextrose  50 mL Intravenous Once  . [COMPLETED] dextrose  1 ampule Intravenous Once  . dextrose      . heparin  5,000 Units Subcutaneous Q8H  . influenza  inactive virus vaccine  0.5 mL Intramuscular Tomorrow-1000  . [COMPLETED]  morphine injection  2 mg Intravenous Once  . multivitamin  1 tablet Oral Daily  . NIFEdipine  90 mg Oral Daily  . sevelamer  800 mg Oral TID WC  . sodium bicarbonate  1,300 mg Oral BID  . [COMPLETED] sodium chloride  500 mL Intravenous Once  . [DISCONTINUED] dextrose  1 ampule Intravenous Once  . [DISCONTINUED] insulin aspart  0-15 Units Subcutaneous Q2H  . [DISCONTINUED] insulin aspart  0-5  Units Subcutaneous QHS     Labs: Basic Metabolic Panel:  Lab 10/12/12 1610 10/11/12 1956  NA 139 139  K 4.5 3.9  CL 101 100  CO2 25 24  GLUCOSE 83 83  BUN 41* 39*  CREATININE 7.91* 7.19*  ALB -- --  CALCIUM 10.2 10.3  PHOS -- --   Liver Function Tests: No results found for this basename: AST:3,ALT:3,ALKPHOS:3,BILITOT:3,PROT:3,ALBUMIN:3 in the last 168 hours No results found for this basename: LIPASE:3,AMYLASE:3 in the last 168 hours No results found for this basename: AMMONIA:3 in the last 168 hours CBC:  Lab 10/12/12 0938 10/11/12 1956  WBC 6.0 6.0  NEUTROABS -- 4.0  HGB 11.3* 11.2*  HCT 35.8* 35.3*  MCV 104.1* 105.4*  PLT 234 230   PT/INR: @labrcntip (inr:5) Cardiac Enzymes: No results found for this basename: CKTOTAL:5,CKMB:5,CKMBINDEX:5,TROPONINI:5 in the last 168 hours CBG:  Lab 10/12/12 1026 10/12/12 0914 10/12/12 0809 10/12/12 0736 10/12/12 0559  GLUCAP 82 108* 111* 47* 107*    Iron Studies: No results found for this basename: IRON:30,TIBC:30,TRANSFERRIN:30,FERRITIN:30 in the last 168 hours  Xrays/Other Studies: Dg Chest 1 View  10/12/2012  *RADIOLOGY REPORT*  Clinical Data: Evaluate for fluid overload.  Shortness of breath. End-stage renal disease.  CHEST - 1 VIEW  Comparison: 10/11/2012  Findings: Dialysis catheter unchanged in position at mid and low SVC.  Moderate cardiomegaly. No pleural effusion or pneumothorax. Similar mild pulmonary venous congestion.  Mild thickening right minor fissure.  Improved right base aeration with mild atelectasis remaining.  IMPRESSION: Cardiomegaly with similar mild pulmonary venous congestion.  No overt congestive failure.   Original Report Authenticated By: Jeronimo Greaves, M.D.    Dg Chest 2 View  10/11/2012  *RADIOLOGY REPORT*  Clinical Data: Hypoglycemia; weakness.  Left arm and leg pain. Cough.  CHEST - 2 VIEW  Comparison: Chest radiograph performed 09/15/2012  Findings: The lungs are well-aerated.  Vascular congestion is  noted.  Mild right basilar airspace opacity could reflect mild interstitial edema.  There is no evidence of pleural effusion or pneumothorax.  The heart is borderline enlarged.  A right-sided dual lumen catheter is noted ending about the distal SVC.  No acute osseous abnormalities are seen.  IMPRESSION: Vascular congestion and borderline cardiomegaly; mild right basilar airspace opacity could reflect mild interstitial edema.   Original Report Authenticated By: Tonia Ghent, M.D.     Physical Exam:  Blood pressure 154/85, pulse 102, temperature 98.5 F (36.9 C), temperature source Oral, resp. rate 20, height 6\' 3"  (1.905 m), weight 130.3 kg (287 lb 4.2 oz), SpO2 97.00%.  Gen: alert, obese AAM, no distress, shifting about due to back discomfort HEENT:  EOMI, sclera anicteric, throat clear Neck: no JVD, no bruits or LAN Chest: clear bilat, no rales or rhonchi  CV: regular, no rub or gallop, no murmur, pedal pulses 2+ bilat Abdomen: soft, obese, nontender, + BS, no obvious ascites Ext:  Trace pretibial edema bilat, no joint effusion or deformity, no gangrene or ulceration Neuro: alert, Ox3, no focal deficit, no asterixis Access: LUA AVF which has a reasonable strong bruit, moderately developed. +R upper chest TDC without drainage on dressing.   Outpatient HD: [facility], 4 hr, 132kg, bfr 400/dfr 800, heparin 2600u, 2K/2Ca bath. Zemplar , venofer 50/wk, EPO 10K. Using R IJ TDC.    Impression/Plan 1. Hypoglycemic episode-- glipizide if the best sulfonylurea to use in CKD patients, if a sulfonylurea medication is needed.  Defer dose adjusting to primary. 2. ESRD, cont hd tts-- HD today using TDC.  Has an appt next week with VVS to reeavaluate the AVF for use.  3. HTN/volume- bp 150/85, plan HD today with 3 kg UF using TDC. Below dry weight by 3 kg but looks like he has room for UF.  4. Anemia of CKD- hold ESA and IV iron 5. Secondary HPTH- zemplar w HD and Melanee Spry  MD [facility]  [facility] [phone] pgr    [phone] cell 10/12/2012, 12:05 PM

## 2012-10-13 ENCOUNTER — Encounter (HOSPITAL_COMMUNITY): Payer: Self-pay | Admitting: Nephrology

## 2012-10-13 ENCOUNTER — Observation Stay (HOSPITAL_COMMUNITY): Payer: Medicare Other

## 2012-10-13 DIAGNOSIS — T82898A Other specified complication of vascular prosthetic devices, implants and grafts, initial encounter: Secondary | ICD-10-CM

## 2012-10-13 DIAGNOSIS — E1129 Type 2 diabetes mellitus with other diabetic kidney complication: Secondary | ICD-10-CM

## 2012-10-13 DIAGNOSIS — N186 End stage renal disease: Secondary | ICD-10-CM

## 2012-10-13 DIAGNOSIS — M25469 Effusion, unspecified knee: Secondary | ICD-10-CM

## 2012-10-13 DIAGNOSIS — N058 Unspecified nephritic syndrome with other morphologic changes: Secondary | ICD-10-CM

## 2012-10-13 LAB — BASIC METABOLIC PANEL
BUN: 26 mg/dL — ABNORMAL HIGH (ref 6–23)
CO2: 27 mEq/L (ref 19–32)
Glucose, Bld: 163 mg/dL — ABNORMAL HIGH (ref 70–99)
Potassium: 4.3 mEq/L (ref 3.5–5.1)
Sodium: 139 mEq/L (ref 135–145)

## 2012-10-13 LAB — SYNOVIAL CELL COUNT + DIFF, W/ CRYSTALS

## 2012-10-13 LAB — GLUCOSE, CAPILLARY
Glucose-Capillary: 175 mg/dL — ABNORMAL HIGH (ref 70–99)
Glucose-Capillary: 211 mg/dL — ABNORMAL HIGH (ref 70–99)
Glucose-Capillary: 238 mg/dL — ABNORMAL HIGH (ref 70–99)
Glucose-Capillary: 213 mg/dL — ABNORMAL HIGH (ref 70–99)

## 2012-10-13 LAB — GRAM STAIN

## 2012-10-13 MED ORDER — PREDNISONE 20 MG PO TABS
40.0000 mg | ORAL_TABLET | Freq: Every day | ORAL | Status: DC
  Administered 2012-10-13 – 2012-10-14 (×2): 40 mg via ORAL
  Filled 2012-10-13 (×4): qty 2

## 2012-10-13 MED ORDER — PREDNISONE 20 MG PO TABS
40.0000 mg | ORAL_TABLET | Freq: Every day | ORAL | Status: DC
  Filled 2012-10-13: qty 2

## 2012-10-13 NOTE — Consult Note (Signed)
[facility]edic [facility] Consult  Reason for Consult: Left knee pain and effusion Referring Physician:  Windell Norfolk, MD (hospitalist)   HPI:       Pt is a 66 y/o AA male, dialysis ESRD, HTN who was admitted 2 days ago with hypoglycemia.  On rounds today the pt was complaining of L knee pain.  Ortho was consulted for evaluation Pt is in room 5523 and notes L knee pain for about 2 days.  Pt really has not been out of bed for the last 2 days and previous to hospital admission has been ambulatory.  Pt denies any numbness or tingling. He does have a dialysis catheter in place.  Did have a fever on admission but has not had elevated temps or white count since.  Pt denies any recent knee pain.  H/o menisectomy many years ago to L knee.  Pt states his last gout flare was approximately 3 years ago but forgets where it was.  Denies and chills, sweats   Past Medical History  Diagnosis Date  . Diabetes mellitus without complication   . ESRD on hemodialysis     [name], [name], ESRD due to DM/HTN, started dialysis in Sep 12, 2012  . Obesity     Past Surgical History  Procedure Date  . Insertion of dialysis catheter 09/15/2012    Procedure: INSERTION OF DIALYSIS CATHETER;  Surgeon: Chuck Hint, MD;  Location: [facility] OR;  Service: Vascular;  Laterality: N/A;  . Menisectomy     L knee    Family History  Problem Relation Age of Onset  . Diabetes Mellitus II    . Hypertension      Social History:  reports that he has never smoked. He does not have any smokeless tobacco history on file. He reports that he does not drink alcohol or use illicit drugs.  Allergies: No Known Allergies  Medications: I have reviewed the patient's current medications.    Dg Chest 1 View  10/12/2012  *RADIOLOGY REPORT*  Clinical Data: Evaluate for fluid overload.  Shortness of breath. End-stage renal disease.  CHEST - 1 VIEW  Comparison: 10/11/2012  Findings: Dialysis catheter unchanged in position at mid and  low SVC.  Moderate cardiomegaly. No pleural effusion or pneumothorax. Similar mild pulmonary venous congestion.  Mild thickening right minor fissure.  Improved right base aeration with mild atelectasis remaining.  IMPRESSION: Cardiomegaly with similar mild pulmonary venous congestion.  No overt congestive failure.   Original Report Authenticated By: Jeronimo Greaves, M.D.    Dg Chest 2 View  10/11/2012  *RADIOLOGY REPORT*  Clinical Data: Hypoglycemia; weakness.  Left arm and leg pain. Cough.  CHEST - 2 VIEW  Comparison: Chest radiograph performed 09/15/2012  Findings: The lungs are well-aerated.  Vascular congestion is noted.  Mild right basilar airspace opacity could reflect mild interstitial edema.  There is no evidence of pleural effusion or pneumothorax.  The heart is borderline enlarged.  A right-sided dual lumen catheter is noted ending about the distal SVC.  No acute osseous abnormalities are seen.  IMPRESSION: Vascular congestion and borderline cardiomegaly; mild right basilar airspace opacity could reflect mild interstitial edema.   Original Report Authenticated By: Tonia Ghent, M.D.    Dg Knee Complete 4 Views Left  10/13/2012  *RADIOLOGY REPORT*  Clinical Data: Knee pain for 3 weeks.  No known injury.  LEFT KNEE - COMPLETE 4+ VIEW  Comparison: None.  Findings: There is no fracture.  There is moderate narrowing of the medial joint  space compartment with small marginal osteophytes. The lateral and patellar femoral joint space compartments are relatively well preserved.  There is a large joint effusion.  Multiple small calcifications project within the joint fluid above the patella.  There larger more well corticated bone fragments below the patella and along the medial anterior aspect of the knee.  There are numerous small soft tissue calcification superficially along the lateral aspect of the knee and proximal leg.  IMPRESSION: No fracture.  Large joint effusion.  Intra-articular calcifications most  evident in the suprapatellar joint capsule. Consider synovial osteochondromatosis in the differential diagnosis.  Other soft tissue calcifications are noted anteromedially with numerous others noted along the superficial, lateral soft tissues, nonspecific.   Original Report Authenticated By: Amie Portland, M.D.     Review of Systems  Constitutional: Negative for fever and chills.  HENT: Negative for sore throat.   Respiratory: Negative for shortness of breath.   Cardiovascular: Negative for chest pain.  Gastrointestinal: Negative for nausea, vomiting and abdominal pain.  Musculoskeletal: Positive for joint pain.       L knee pain   Blood pressure 151/77, pulse 106, temperature 99.3 F (37.4 C), temperature source Oral, resp. rate 24, height 6\' 3"  (1.905 m), weight 129.6 kg (285 lb 11.5 oz), SpO2 94.00%. Physical Exam  Constitutional: He is cooperative.       Chronically ill appearing AA male  HENT:  Head: Atraumatic.  Cardiovascular:       s1 and s2  Respiratory:       Ulabored, clear anterior fields  Musculoskeletal:       Left Lower Extremity   L hip, ankle and foot are unremarkable   Old medial scar noted to L knee   Significant L knee effusion appreciated   Pain with ROM of L knee but can get to full extension and to about 50 degrees of flexion, discomfort more so than frank pain   Knee stable with exam   DPN, SPN, TN sensation intact   EHL, FHL, AT, PT, Peroneals and gastrocsoleus motor intact    + DP pulse   No dct  Neurological: He is alert.  Skin:       Distal skin changes noted to legs No signs of infecion  Psychiatric: He has a normal mood and affect. His speech is normal and behavior is normal. Judgment and thought content normal. Cognition and memory are normal.    Assessment/Plan:   66 y/o AA male ESRD with L knee pain and effusion  1. L knee pain and effusion  Pt agreeable to arthrocentesis  Will send fluid down for gram stain, cell count, crystals,  culture  Pt not exhibiting signs/symptoms consistent with septic arthritis but he is immunocompromised with indwelling catheter and is prone to such   Gout and OA remain in differential as well     If gout/OA pt not likely candidate for NSAID's as he is ESRD on dialysis  May need to place on Gout meds if this is the final dx- would need to follow up with Rheum/PCP to determine if pt is over-producer or under excretor     2. Hypoglycemia  Per medicine  3. Dispo  L knee arthrocentesis  Lab f/u    Procedure  Pre-procedure dx: L knee effusion Post procedure dx: same Clinician: Oletha Blend, PA-C Procedure: L knee arthrocentesis  Specimen: L knee synovial fluid x 44 cc   Total fluid removed 85 cc of straw colored fluid   Utilizing sterile  technique a superolateral patella approach was identified.  The area was marked, cleaned with alcohol swab and betadine swab x 3.  The area was then infiltrated with 3 cc of 1% lidocaine.  After adequate anesthesia was obtained a 18 g needle was inserted on a 20 cc syringe. Entrance into the knee joint was appreciate with a palpable give in resistance.  Retracting the plunger began to yield straw colored fluid without evidence of a traumatic entrance. A total of four 20 cc syringes were used fill all beyond 20 cc's.  Needle was removed. Bandage applied.  Pt tolerated procedure well.  Specimens sent to lab for evaluation.     Mearl Latin, PA-C [facility] [phone] (P) 10/13/2012 12:48 PM   Blaine Guiffre W 10/13/2012, 12:36 PM

## 2012-10-13 NOTE — Progress Notes (Signed)
Subjective: c/o L knee pain  Objective Vital signs in last 24 hours: Filed Vitals:   10/12/12 1930 10/12/12 1954 10/12/12 2027 10/13/12 0455  BP: 144/58 124/62 122/66 151/77  Pulse: 104 97 100 106  Temp:  98.8 F (37.1 C) 99.4 F (37.4 C) 99.3 F (37.4 C)  TempSrc:  Oral Axillary Oral  Resp: 16 18 18 24   Height:      Weight:  129.6 kg (285 lb 11.5 oz)    SpO2:  98% 94% 94%   Weight change: 0.2 kg (7.1 oz)  Intake/Output Summary (Last 24 hours) at 10/13/12 0945 Last data filed at 10/13/12 0826  Gross per 24 hour  Intake    948 ml  Output   2772 ml  Net  -1824 ml   Labs: Basic Metabolic Panel:  Lab 10/13/12 5284 10/12/12 0938 10/11/12 1956  NA 139 139 139  K 4.3 4.5 3.9  CL 100 101 100  CO2 27 25 24   GLUCOSE 163* 83 83  BUN 26* 41* 39*  CREATININE 5.30* 7.91* 7.19*  ALB -- -- --  CALCIUM 9.8 10.2 10.3  PHOS -- -- --   Liver Function Tests: No results found for this basename: AST:3,ALT:3,ALKPHOS:3,BILITOT:3,PROT:3,ALBUMIN:3 in the last 168 hours No results found for this basename: LIPASE:3,AMYLASE:3 in the last 168 hours No results found for this basename: AMMONIA:3 in the last 168 hours CBC:  Lab 10/12/12 0938 10/11/12 1956  WBC 6.0 6.0  NEUTROABS -- 4.0  HGB 11.3* 11.2*  HCT 35.8* 35.3*  MCV 104.1* 105.4*  PLT 234 230   PT/INR: @labrcntip (inr:5) Cardiac Enzymes: No results found for this basename: CKTOTAL:5,CKMB:5,CKMBINDEX:5,TROPONINI:5 in the last 168 hours CBG:  Lab 10/13/12 0747 10/13/12 0503 10/13/12 0259 10/12/12 2346 10/12/12 2021  GLUCAP 211* 169* 175* 215* 177*    Iron Studies: No results found for this basename: IRON:30,TIBC:30,TRANSFERRIN:30,FERRITIN:30 in the last 168 hours  Physical Exam:  Blood pressure 151/77, pulse 106, temperature 99.3 F (37.4 C), temperature source Oral, resp. rate 24, height 6\' 3"  (1.905 m), weight 129.6 kg (285 lb 11.5 oz), SpO2 94.00%.  Gen: alert, obese AAM, no distress, shifting about due to back discomfort   HEENT: EOMI, sclera anicteric, throat clear  Neck: no JVD, no bruits or LAN  Chest: clear bilat, no rales or rhonchi  CV: regular, no rub or gallop, no murmur, pedal pulses 2+ bilat  Abdomen: soft, obese, nontender, + BS, no obvious ascites  Ext: Trace pretibial edema bilat, L knee slightly warm with mod effusion, ROM w slight pain Neuro: alert, Ox3, no focal deficit, no asterixis  Access: LUA AVF which has a reasonable strong bruit, moderately developed. +R upper chest TDC without drainage on dressing.   Outpatient HD: [facility], 4 hr, 132kg, bfr 400/dfr 800, heparin 2600u, 2K/2Ca bath. Zemplar , venofer 50/wk, EPO 10K. Using R IJ TDC, has LUA AVF also.  Impression/Plan  1. Hypoglycemic episode-- glipizide discontinued. BS's stable 2. L knee pain/effusion- defer to primary, ?gout. He did have a fever and is at risk for joint infections with indwelling HD cath, but the knee doesn't look that bad on exam. Will d/w primary 3. ESRD, cont hd tts-- HD tomorrow. Has AVF which infiltrated on last admit, hematoma resolved, good bruit. Missed [facility] appt this week due to admission, will ask [facility] to see today re: AVF. 4. HTN/volume- bp 150/85, only tolerated 1.5 kg off yest with HD due to cramping.  Is 2 kg below EDW, no UF with HD tomorrow. 5. Anemia  of CKD- hold ESA and IV iron, will resume if pt staying longer 6. Secondary HPTH- zemplar w HD and Melanee Spry  MD [facility] [phone] pgr    [phone] cell 10/13/2012, 9:45 AM

## 2012-10-13 NOTE — Progress Notes (Addendum)
VASCULAR AND VEIN SURGERY PROGRESS NOTE  POST-OP HEMODIALYSIS ACCESS   HPI: Nicolas Mccann is a 66 y.o. male who has left upper extremity Hemodialysis access. The patient complains of symptoms of mod numbness in left hand but can grip without difficulty; denies pain in the operative limb. Ecchymosis has resolved.   Per Dr. Adele Dan office note: Duplex shows that AVF is patent with no significant competing branches noted. I would recommend fistulogram in around 3 weeks when ecchymosis, induration, and swelling are improved. I will arrange f/u in 2-3 weeks. Pt had appt for 10/18/12. Cari Caraway  Beeper: [phone]  09/17/2012     Significant Diagnostic Studies: CBC Lab Results  Component Value Date   WBC 6.0 10/12/2012   HGB 11.3* 10/12/2012   HCT 35.8* 10/12/2012   MCV 104.1* 10/12/2012   PLT 234 10/12/2012    BMET    Component Value Date/Time   NA 139 10/13/2012 0700   K 4.3 10/13/2012 0700   CL 100 10/13/2012 0700   CO2 27 10/13/2012 0700   GLUCOSE 163* 10/13/2012 0700   BUN 26* 10/13/2012 0700   CREATININE 5.30* 10/13/2012 0700   CALCIUM 9.8 10/13/2012 0700   CALCIUM 9.2 01/13/2011 2249   GFRNONAA 10* 10/13/2012 0700   GFRAA 12* 10/13/2012 0700    COAG No results found for this basename: INR, PROTIME   No results found for this basename: PTT    Vital Signs  BP Readings from Last 3 Encounters:  10/13/12 151/77  09/26/12 117/66  09/19/12 130/81   Temp Readings from Last 3 Encounters:  10/13/12 99.3 F (37.4 C) Oral  09/26/12 98.2 F (36.8 C) Oral  09/19/12 99 F (37.2 C) Oral   SpO2 Readings from Last 3 Encounters:  10/13/12 94%  09/26/12 92%  09/19/12 91%   Pulse Readings from Last 3 Encounters:  10/13/12 106  09/26/12 102  09/19/12 87     Physical Examination  left upper Incision is healing well, skin color is normal , hand grip is 5/5, sensation in digits is mildly decreased;  There is a good thrill and good bruit in the left brachiocephalic AVF. The  fistula is tortuous but feels of adequate size and is palpable  Duplex of AVF 09/17/12 Left brachial: There is a surgical arteriovenous graft to the cephalic vein. Vein mapping:  +--------+--------+--------+--------+------------+---------+ Vein LocationDiameterDiameterObservationsComments     2    +--------+--------+--------+--------+------------+---------+ Cephalic--------8.42mm --------------------1.3 cm       deep  +--------+--------+--------+--------+------------+---------+ CephalicAt 8.30mm --------Tortuous 1.2 cm   shoulder   deep  +--------+--------+--------+--------+------------+---------+ CephalicBelow 7.23mm 6.39mm Tortuous 1.23 deep  shoulder   for       anterior       segment       and 2.1       cm deep       for       posterior      segment  +--------+--------+--------+--------+------------+---------+ CephalicMid 8.23mm --------Tortuous 1.7 cm   upper    deep   arm      +--------+--------+--------+--------+------------+---------+ CephalicAt elbow14.79mm --------------------0.39 cm       deep  +--------+--------+--------+--------+------------+---------+  ------------------------------------------------------------ Summary: Left brachial: There is a surgical arteriovenous graft to the cephalic vein.  Patent AV graft without any branches. Prepared and Electronically Authenticated by Cari Caraway, MD    Assessment/Plan Nicolas Mccann is a 66 y.o. year old male who is s/p creation of left upper extremity Hemodialysis access. Pt had infiltration of HD access 1 month ago and Dr  Edilia Bo has reccommended a fistulogram.  Pt had duplex showing tortuous AVF of adequate size but > 1 cm deep above AC space Pt may need superficialization of AVF Will discuss with Dr. Arbie Cookey  I have examined the patient, reviewed and agree with above. The  patient does have a good size maturation and good thrill in his AV fistula. He is morbidly obese in his left upper arm cephalic vein runs quite deep. I would recommend superficial mobilization of his cephalic vein for better long-term access. I explained that he may have short-term success but feel that he will continue to have difficulty with infiltration and difficult access. This can be scheduled as an outpatient if he is discharged or can be done Sherrell Farish next week if he is still hospitalized. Jaylend Reiland, MD 10/13/2012 1:57 PM

## 2012-10-13 NOTE — Progress Notes (Signed)
Inpatient Diabetes Program Recommendations  AACE/ADA: New Consensus Statement on Inpatient Glycemic Control (2013)  Target Ranges:  Prepandial:   less than 140 mg/dL      Peak postprandial:   less than 180 mg/dL (1-2 hours)      Critically ill patients:  140 - 180 mg/dL   Reason for Visit: CBGs 10/13/12  [phone] mg/dl  Inpatient Diabetes Program Recommendations Correction (SSI): Start Novolog SENSITIVE correction scale AC & HS if CBGs continue greater than 180 mg/dl.    Note: CBGs to be checked AC & HS.

## 2012-10-13 NOTE — Progress Notes (Signed)
PATIENT DETAILS Name: Nicolas Mccann Age: 66 y.o. Sex: male Date of Birth: 1945-12-03 Admit Date: 10/11/2012 Admitting Physician Gery Pray, MD ZOX:[id number], MD  Subjective: Awake and alert-CBG's better-pain in the left knee today  Assessment/Plan: Active Problems: Hypoglycemia -2/2 to use of long acting Glipizide in the setting of ESRD -repeat A1C-5.6-will see CBG trend before starting on any agents -CBG's better -change CBG's to [facility] and HS  HTN -controlled -c/w Procardia  ESRD -per Renal  Left Knee pain -HD patient-has HD catheter-had fever-so could have bacteremia and seeding vs gout -c/w Levaquin-start steroids -have asked ortho to tap -blood cultures on admission-12/4-no growth  Acute Bronchitis -maintain on levaquin -afebrile now    Obesity -counseled regarding weight loss  Metabolic bone disease -per renal  Disposition: Remain inpatient  DVT Prophylaxis: Prophylactic  Heparin  Code Status: Full code  Procedures:  None  CONSULTS:  nephrology ortho  PHYSICAL EXAM: Vital signs in last 24 hours: Filed Vitals:   10/12/12 1930 10/12/12 1954 10/12/12 2027 10/13/12 0455  BP: 144/58 124/62 122/66 151/77  Pulse: 104 97 100 106  Temp:  98.8 F (37.1 C) 99.4 F (37.4 C) 99.3 F (37.4 C)  TempSrc:  Oral Axillary Oral  Resp: 16 18 18 24   Height:      Weight:  129.6 kg (285 lb 11.5 oz)    SpO2:  98% 94% 94%    Weight change: 0.2 kg (7.1 oz) Body mass index is 35.71 kg/(m^2).   Gen Exam: Awake and alert with clear speech.  Neck: Supple, No JVD.   Chest: B/L Clear.   CVS: S1 S2 Regular, no murmurs.  Abdomen: soft, BS +, non tender, non distended.  Extremities: no edema, lower extremities warm to touch.Left knee slight swollen and erythematous Neurologic: Non Focal.   Skin: No Rash.   Wounds: N/A.    Intake/Output from previous day:  Intake/Output Summary (Last 24 hours) at 10/13/12 1401 Last data filed at 10/13/12 0826  Gross  per 24 hour  Intake    708 ml  Output   2572 ml  Net  -1864 ml     LAB RESULTS: CBC  Lab 10/12/12 0938 10/11/12 1956  WBC 6.0 6.0  HGB 11.3* 11.2*  HCT 35.8* 35.3*  PLT 234 230  MCV 104.1* 105.4*  MCH 32.8 33.4  MCHC 31.6 31.7  RDW 16.4* 16.5*  LYMPHSABS -- 1.1  MONOABS -- 0.8  EOSABS -- 0.1  BASOSABS -- 0.0  BANDABS -- --    Chemistries   Lab 10/13/12 0700 10/12/12 0938 10/11/12 1956  NA 139 139 139  K 4.3 4.5 3.9  CL 100 101 100  CO2 27 25 24   GLUCOSE 163* 83 83  BUN 26* 41* 39*  CREATININE 5.30* 7.91* 7.19*  CALCIUM 9.8 10.2 10.3  MG -- -- --    CBG:  Lab 10/13/12 1219 10/13/12 1011 10/13/12 0747 10/13/12 0503 10/13/12 0259  GLUCAP 213* 238* 211* 169* 175*    GFR Estimated Creatinine Clearance: 19.9 ml/min (by C-G formula based on Cr of 5.3).  Coagulation profile No results found for this basename: INR:5,PROTIME:5 in the last 168 hours  Cardiac Enzymes No results found for this basename: CK:3,CKMB:3,TROPONINI:3,MYOGLOBIN:3 in the last 168 hours  No components found with this basename: POCBNP:3 No results found for this basename: DDIMER:2 in the last 72 hours  Basename 10/12/12 1216  HGBA1C 5.6   No results found for this basename: CHOL:2,HDL:2,LDLCALC:2,TRIG:2,CHOLHDL:2,LDLDIRECT:2 in the last 72 hours No results found for  this basename: TSH,T4TOTAL,FREET3,T3FREE,THYROIDAB in the last 72 hours No results found for this basename: VITAMINB12:2,FOLATE:2,FERRITIN:2,TIBC:2,IRON:2,RETICCTPCT:2 in the last 72 hours No results found for this basename: LIPASE:2,AMYLASE:2 in the last 72 hours  Urine Studies No results found for this basename: UACOL:2,UAPR:2,USPG:2,UPH:2,UTP:2,UGL:2,UKET:2,UBIL:2,UHGB:2,UNIT:2,UROB:2,ULEU:2,UEPI:2,UWBC:2,URBC:2,UBAC:2,CAST:2,CRYS:2,UCOM:2,BILUA:2 in the last 72 hours  MICROBIOLOGY: Recent Results (from the past 240 hour(s))  CULTURE, BLOOD (ROUTINE X 2)     Status: Normal (Preliminary result)   Collection Time    10/11/12  9:50 PM      Component Value Range Status Comment   Specimen Description BLOOD RIGHT HAND   Final    Special Requests BOTTLES DRAWN AEROBIC ONLY 3CC   Final    Culture  Setup Time 10/12/2012 04:40   Final    Culture     Final    Value:        BLOOD CULTURE RECEIVED NO GROWTH TO DATE CULTURE WILL BE HELD FOR 5 DAYS BEFORE ISSUING A FINAL NEGATIVE REPORT   Report Status PENDING   Incomplete   CULTURE, BLOOD (ROUTINE X 2)     Status: Normal (Preliminary result)   Collection Time   10/11/12 10:23 PM      Component Value Range Status Comment   Specimen Description BLOOD RIGHT HAND   Final    Special Requests BOTTLES DRAWN AEROBIC ONLY 5CC   Final    Culture  Setup Time 10/12/2012 04:40   Final    Culture     Final    Value:        BLOOD CULTURE RECEIVED NO GROWTH TO DATE CULTURE WILL BE HELD FOR 5 DAYS BEFORE ISSUING A FINAL NEGATIVE REPORT   Report Status PENDING   Incomplete   MRSA PCR SCREENING     Status: Normal   Collection Time   10/12/12  6:28 AM      Component Value Range Status Comment   MRSA by PCR NEGATIVE  NEGATIVE Final     RADIOLOGY STUDIES/RESULTS: Dg Chest 1 View  10/12/2012  *RADIOLOGY REPORT*  Clinical Data: Evaluate for fluid overload.  Shortness of breath. End-stage renal disease.  CHEST - 1 VIEW  Comparison: 10/11/2012  Findings: Dialysis catheter unchanged in position at mid and low SVC.  Moderate cardiomegaly. No pleural effusion or pneumothorax. Similar mild pulmonary venous congestion.  Mild thickening right minor fissure.  Improved right base aeration with mild atelectasis remaining.  IMPRESSION: Cardiomegaly with similar mild pulmonary venous congestion.  No overt congestive failure.   Original Report Authenticated By: Jeronimo Greaves, M.D.    Dg Chest 2 View  10/11/2012  *RADIOLOGY REPORT*  Clinical Data: Hypoglycemia; weakness.  Left arm and leg pain. Cough.  CHEST - 2 VIEW  Comparison: Chest radiograph performed 09/15/2012  Findings: The lungs are  well-aerated.  Vascular congestion is noted.  Mild right basilar airspace opacity could reflect mild interstitial edema.  There is no evidence of pleural effusion or pneumothorax.  The heart is borderline enlarged.  A right-sided dual lumen catheter is noted ending about the distal SVC.  No acute osseous abnormalities are seen.  IMPRESSION: Vascular congestion and borderline cardiomegaly; mild right basilar airspace opacity could reflect mild interstitial edema.   Original Report Authenticated By: Tonia Ghent, M.D.    Dg Chest 2 View  09/14/2012  *RADIOLOGY REPORT*  Clinical Data: Follow-up edema.  Diabetes and hypertension. Dialysis patient  CHEST - 2 VIEW  Comparison: 09/13/2012  Findings: Cardiomegaly is identified and appears stable. There is persistent pulmonary vascular congestion, small bilateral pleural effusions  and fissural fluid identified compatible with mild pulmonary edema.  This is unchanged in comparison with the prior exam.  No new focal infiltrates are identified.  Bony structures demonstrate some degenerative change of the lower thoracic spine.  IMPRESSION: Unchanged pulmonary vascular congestion and mild pulmonary edema pattern.   Original Report Authenticated By: Rhodia Albright, M.D.    Dg Chest Port 1 View  09/15/2012  *RADIOLOGY REPORT*  Clinical Data: Central line placement  PORTABLE CHEST - 1 VIEW  Comparison: Chest radiograph 09/14/2012  Findings: There is interval placement of large bore right central venous line.  Split catheter tips in the distal SVC.  No pneumothorax.  Stable enlarged heart silhouette.  There is central venous congestion and mild pulmonary edema not changed from prior.  IMPRESSION: 1.  Interval placement right central venous line without complication. 2.  Cardiomegaly and mild pulmonary edema.   Original Report Authenticated By: [name], M.D.    Dg Chest Port 1 View  09/13/2012  *RADIOLOGY REPORT*  Clinical Data: Pulmonary edema follow-up  PORTABLE  CHEST - 1 VIEW  Comparison: 09/12/2012  Findings: Prominent cardiomediastinal contours.  Central vascular congestion.  Central peribronchial thickening, interstitial, and left greater right perihilar/infrahilar airspace opacities.  No definite pleural effusion.  No pneumothorax.  No interval osseous change.  IMPRESSION: Prominent cardiomediastinal contours with pulmonary edema pattern, similar to prior.   Original Report Authenticated By: Jearld Lesch, M.D.    Dg Chest Portable 1 View  09/12/2012  *RADIOLOGY REPORT*  Clinical Data: Shortness of breath and chest pain after exertion when catches breath, 1 month in duration, history hypertension, diabetes  PORTABLE CHEST - 1 VIEW  Comparison: Portable exam 1354 hours compared to 09/06/2005  Findings: Enlargement of cardiac silhouette with pulmonary vascular congestion. Mild perihilar infiltrates question edema, infection not excluded. No gross pleural effusion or pneumothorax. Bones unremarkable.  IMPRESSION: Enlargement of cardiac silhouette with pulmonary vascular congestion and mild perihilar infiltrates, slightly greater on left, favor edema over infection.   Original Report Authenticated By: Ulyses Southward, M.D.     MEDICATIONS: Scheduled Meds:    . amitriptyline  25 mg Oral QHS  . [EXPIRED] dextrose      . [COMPLETED] heparin  20 Units/kg Dialysis Once in dialysis  . heparin  5,000 Units Subcutaneous Q8H  . influenza  inactive virus vaccine  0.5 mL Intramuscular Tomorrow-1000  . levofloxacin  500 mg Oral Q48H  . multivitamin  1 tablet Oral Daily  . NIFEdipine  90 mg Oral Daily  . predniSONE  40 mg Oral Q breakfast  . sevelamer  800 mg Oral TID WC  . [DISCONTINUED] predniSONE  40 mg Oral Q breakfast  . [DISCONTINUED] predniSONE  40 mg Oral Q breakfast   Continuous Infusions:    . dextrose 5 % and 0.45% NaCl 20 mL (10/12/12 1316)   PRN Meds:.acetaminophen, acetaminophen, alum & mag hydroxide-simeth, docusate sodium, ondansetron (ZOFRAN)  IV, ondansetron, oxyCODONE, [DISCONTINUED] sodium chloride, [DISCONTINUED] sodium chloride, [DISCONTINUED] alteplase, [DISCONTINUED] feeding supplement (NEPRO CARB STEADY), [DISCONTINUED] heparin, [DISCONTINUED] lidocaine, [DISCONTINUED] lidocaine-prilocaine, [DISCONTINUED] pentafluoroprop-tetrafluoroeth  Antibiotics: Anti-infectives     Start     Dose/Rate Route Frequency Ordered Stop   10/12/12 1300   levofloxacin (LEVAQUIN) tablet 500 mg        500 mg Oral Every 48 hours 10/12/12 1225     10/12/12 0300   cefTRIAXone (ROCEPHIN) 1 g in dextrose 5 % 50 mL IVPB        1 g 100 mL/hr over 30  Minutes Intravenous  Once 10/12/12 0224 10/12/12 Josph Macho, MD  [facility] Pager:[phone]  If 7PM-7AM, please contact night-coverage [email or web address] Password [email or web address] 10/13/2012, 2:01 PM   LOS: 2 days

## 2012-10-14 DIAGNOSIS — M109 Gout, unspecified: Secondary | ICD-10-CM

## 2012-10-14 DIAGNOSIS — D631 Anemia in chronic kidney disease: Secondary | ICD-10-CM

## 2012-10-14 DIAGNOSIS — N189 Chronic kidney disease, unspecified: Secondary | ICD-10-CM

## 2012-10-14 LAB — CBC
HCT: 34.6 % — ABNORMAL LOW (ref 39.0–52.0)
Hemoglobin: 11.1 g/dL — ABNORMAL LOW (ref 13.0–17.0)
MCV: 102.1 fL — ABNORMAL HIGH (ref 78.0–100.0)
RBC: 3.39 MIL/uL — ABNORMAL LOW (ref 4.22–5.81)
WBC: 10.6 10*3/uL — ABNORMAL HIGH (ref 4.0–10.5)

## 2012-10-14 LAB — BASIC METABOLIC PANEL
BUN: 50 mg/dL — ABNORMAL HIGH (ref 6–23)
CO2: 20 mEq/L (ref 19–32)
Chloride: 99 mEq/L (ref 96–112)
Creatinine, Ser: 7.04 mg/dL — ABNORMAL HIGH (ref 0.50–1.35)
Potassium: 5.1 mEq/L (ref 3.5–5.1)

## 2012-10-14 LAB — GLUCOSE, CAPILLARY
Glucose-Capillary: 196 mg/dL — ABNORMAL HIGH (ref 70–99)
Glucose-Capillary: 247 mg/dL — ABNORMAL HIGH (ref 70–99)

## 2012-10-14 MED ORDER — NEPRO/CARBSTEADY PO LIQD
237.0000 mL | ORAL | Status: DC | PRN
  Filled 2012-10-14: qty 237

## 2012-10-14 MED ORDER — ALTEPLASE 2 MG IJ SOLR
2.0000 mg | Freq: Once | INTRAMUSCULAR | Status: DC | PRN
  Filled 2012-10-14: qty 2

## 2012-10-14 MED ORDER — ALBUTEROL SULFATE (5 MG/ML) 0.5% IN NEBU
2.5000 mg | INHALATION_SOLUTION | Freq: Three times a day (TID) | RESPIRATORY_TRACT | Status: DC
  Administered 2012-10-15 – 2012-10-16 (×2): 2.5 mg via RESPIRATORY_TRACT
  Filled 2012-10-14 (×4): qty 0.5

## 2012-10-14 MED ORDER — INSULIN ASPART 100 UNIT/ML ~~LOC~~ SOLN
0.0000 [IU] | Freq: Three times a day (TID) | SUBCUTANEOUS | Status: DC
  Administered 2012-10-14 (×2): 2 [IU] via SUBCUTANEOUS
  Administered 2012-10-14 – 2012-10-15 (×2): 3 [IU] via SUBCUTANEOUS

## 2012-10-14 MED ORDER — GUAIFENESIN ER 600 MG PO TB12
600.0000 mg | ORAL_TABLET | Freq: Two times a day (BID) | ORAL | Status: DC
  Administered 2012-10-14 – 2012-10-16 (×5): 600 mg via ORAL
  Filled 2012-10-14 (×7): qty 1

## 2012-10-14 MED ORDER — SODIUM CHLORIDE 0.9 % IV SOLN: 100.0000 mL | INTRAVENOUS | Status: DC | PRN

## 2012-10-14 MED ORDER — DARBEPOETIN ALFA-POLYSORBATE 60 MCG/0.3ML IJ SOLN
INTRAMUSCULAR | Status: AC
  Filled 2012-10-14: qty 0.3

## 2012-10-14 MED ORDER — GLIPIZIDE 2.5 MG HALF TABLET
2.5000 mg | ORAL_TABLET | Freq: Every day | ORAL | Status: DC
  Administered 2012-10-14 – 2012-10-16 (×3): 2.5 mg via ORAL
  Filled 2012-10-14 (×4): qty 1

## 2012-10-14 MED ORDER — HEPARIN SODIUM (PORCINE) 1000 UNIT/ML DIALYSIS: 20.0000 [IU]/kg | INTRAMUSCULAR | Status: DC | PRN

## 2012-10-14 MED ORDER — LIDOCAINE HCL (PF) 1 % IJ SOLN: 5.0000 mL | INTRAMUSCULAR | Status: DC | PRN

## 2012-10-14 MED ORDER — SODIUM CHLORIDE 0.9 % IV SOLN
62.5000 mg | INTRAVENOUS | Status: DC
  Administered 2012-10-14: 62.5 mg via INTRAVENOUS
  Filled 2012-10-14 (×2): qty 5

## 2012-10-14 MED ORDER — LIDOCAINE-PRILOCAINE 2.5-2.5 % EX CREA: 1.0000 "application " | TOPICAL_CREAM | CUTANEOUS | Status: DC | PRN

## 2012-10-14 MED ORDER — HEPARIN SODIUM (PORCINE) 1000 UNIT/ML DIALYSIS
1000.0000 [IU] | INTRAMUSCULAR | Status: DC | PRN
  Administered 2012-10-14: 1000 [IU] via INTRAVENOUS_CENTRAL

## 2012-10-14 MED ORDER — PENTAFLUOROPROP-TETRAFLUOROETH EX AERO: 1.0000 "application " | INHALATION_SPRAY | CUTANEOUS | Status: DC | PRN

## 2012-10-14 MED ORDER — DARBEPOETIN ALFA-POLYSORBATE 60 MCG/0.3ML IJ SOLN
60.0000 ug | INTRAMUSCULAR | Status: DC
  Administered 2012-10-14: 60 ug via INTRAVENOUS
  Filled 2012-10-14: qty 0.3

## 2012-10-14 NOTE — Progress Notes (Signed)
[facility]  S:  Doing well this am  L knee pain much better  Pt unable to find new PCP, needs help finding a physician in town who is currently accepting medicare  O:   BP 147/70  Pulse 95  Temp 98.5 F (36.9 C) (Oral)  Resp 18  Ht 6\' 3"  (1.905 m)  Wt 129.6 kg (285 lb 11.5 oz)  BMI 35.71 kg/m2  SpO2 95%   Gen: sitting in chair, NAD  Left knee   Effusion decreased   No appreciable return   Arthrocentesis site looks good   Distal motor and sensory functions at baseline     Synovial fluid with monosodium urate crystals  A:   66 y/o AA male, ESRD, DM with acute L gout flare  P:      Acute L knee gout flare   Continue steroid taper as per IM   WBAT, ROM as tolerated   NSAID's likely contraindicated give pts ESRD   Colchicine also not ideal for pt with ESRD and HD   Pt really needs to establish care with new PCP and consider rheumatologist to manage gout and workup etiology to guide therapy   SW consult to help with MD search    Ortho will sign off   Call with questions   Pt only needs to follow up if issues arise  Mearl Latin, PA-C [facility] [phone] (P) 10/14/2012 11:32 AM

## 2012-10-14 NOTE — Progress Notes (Signed)
PATIENT DETAILS Name: Nicolas Mccann Age: 66 y.o. Sex: male Date of Birth: 11/01/1946 Admit Date: 10/11/2012 Admitting Physician Gery Pray, MD UJW:[id number], MD  Subjective: Awake and alert-left knee better today  Assessment/Plan: Active Problems: Hypoglycemia -2/2 to use of long acting Glipizide in the setting of ESRD-resolved -CBG's back up-starting low dose Glipizide (not long acting), place on low dose SSI -repeat A1C-5.6-  HTN -controlled -c/w Procardia  ESRD -per Renal  Left Knee pain-Acute Gouty Arthritis -taper prednisone -had knee tap 12/6-+crystals  Acute Bronchitis -maintain on levaquin -afebrile now -blood cultures neg so far    Obesity -counseled regarding weight loss  Metabolic bone disease -per renal  Disposition: Remain inpatient  DVT Prophylaxis: Prophylactic  Heparin  Code Status: Full code  Procedures:  None  CONSULTS:  nephrology ortho  PHYSICAL EXAM: Vital signs in last 24 hours: Filed Vitals:   10/12/12 2027 10/13/12 0455 10/13/12 2200 10/14/12 0528  BP: 122/66 151/77 118/70 147/70  Pulse: 100 106 79 95  Temp: 99.4 F (37.4 C) 99.3 F (37.4 C) 99.2 F (37.3 C) 98.5 F (36.9 C)  TempSrc: Axillary Oral  Oral  Resp: 18 24 20 18   Height:      Weight:      SpO2: 94% 94% 93% 95%    Weight change:  Body mass index is 35.71 kg/(m^2).   Gen Exam: Awake and alert with clear speech.  Neck: Supple, No JVD.   Chest: B/L Clear.   CVS: S1 S2 Regular, no murmurs.  Abdomen: soft, BS +, non tender, non distended.  Extremities: no edema, lower extremities warm to touch.Left knee slight swollen and erythematous Neurologic: Non Focal.   Skin: No Rash.   Wounds: N/A.    Intake/Output from previous day:  Intake/Output Summary (Last 24 hours) at 10/14/12 1108 Last data filed at 10/14/12 0900  Gross per 24 hour  Intake    720 ml  Output   1250 ml  Net   -530 ml     LAB RESULTS: CBC  Lab 10/14/12 0605 10/12/12  0938 10/11/12 1956  WBC 10.6* 6.0 6.0  HGB 11.1* 11.3* 11.2*  HCT 34.6* 35.8* 35.3*  PLT 274 234 230  MCV 102.1* 104.1* 105.4*  MCH 32.7 32.8 33.4  MCHC 32.1 31.6 31.7  RDW 15.6* 16.4* 16.5*  LYMPHSABS -- -- 1.1  MONOABS -- -- 0.8  EOSABS -- -- 0.1  BASOSABS -- -- 0.0  BANDABS -- -- --    Chemistries   Lab 10/14/12 0605 10/13/12 0700 10/12/12 0938 10/11/12 1956  NA 137 139 139 139  K 5.1 4.3 4.5 3.9  CL 99 100 101 100  CO2 20 27 25 24   GLUCOSE 228* 163* 83 83  BUN 50* 26* 41* 39*  CREATININE 7.04* 5.30* 7.91* 7.19*  CALCIUM 10.2 9.8 10.2 10.3  MG -- -- -- --    CBG:  Lab 10/14/12 0758 10/13/12 2156 10/13/12 1711 10/13/12 1219 10/13/12 1011  GLUCAP 196* 378* 256* 213* 238*    GFR Estimated Creatinine Clearance: 15 ml/min (by C-G formula based on Cr of 7.04).  Coagulation profile No results found for this basename: INR:5,PROTIME:5 in the last 168 hours  Cardiac Enzymes No results found for this basename: CK:3,CKMB:3,TROPONINI:3,MYOGLOBIN:3 in the last 168 hours  No components found with this basename: POCBNP:3 No results found for this basename: DDIMER:2 in the last 72 hours  Basename 10/12/12 1216  HGBA1C 5.6   No results found for this basename: CHOL:2,HDL:2,LDLCALC:2,TRIG:2,CHOLHDL:2,LDLDIRECT:2 in the last 72  hours No results found for this basename: TSH,T4TOTAL,FREET3,T3FREE,THYROIDAB in the last 72 hours No results found for this basename: VITAMINB12:2,FOLATE:2,FERRITIN:2,TIBC:2,IRON:2,RETICCTPCT:2 in the last 72 hours No results found for this basename: LIPASE:2,AMYLASE:2 in the last 72 hours  Urine Studies No results found for this basename: UACOL:2,UAPR:2,USPG:2,UPH:2,UTP:2,UGL:2,UKET:2,UBIL:2,UHGB:2,UNIT:2,UROB:2,ULEU:2,UEPI:2,UWBC:2,URBC:2,UBAC:2,CAST:2,CRYS:2,UCOM:2,BILUA:2 in the last 72 hours  MICROBIOLOGY: Recent Results (from the past 240 hour(s))  CULTURE, BLOOD (ROUTINE X 2)     Status: Normal (Preliminary result)   Collection Time    10/11/12  9:50 PM      Component Value Range Status Comment   Specimen Description BLOOD RIGHT HAND   Final    Special Requests BOTTLES DRAWN AEROBIC ONLY 3CC   Final    Culture  Setup Time 10/12/2012 04:40   Final    Culture     Final    Value:        BLOOD CULTURE RECEIVED NO GROWTH TO DATE CULTURE WILL BE HELD FOR 5 DAYS BEFORE ISSUING A FINAL NEGATIVE REPORT   Report Status PENDING   Incomplete   CULTURE, BLOOD (ROUTINE X 2)     Status: Normal (Preliminary result)   Collection Time   10/11/12 10:23 PM      Component Value Range Status Comment   Specimen Description BLOOD RIGHT HAND   Final    Special Requests BOTTLES DRAWN AEROBIC ONLY 5CC   Final    Culture  Setup Time 10/12/2012 04:40   Final    Culture     Final    Value:        BLOOD CULTURE RECEIVED NO GROWTH TO DATE CULTURE WILL BE HELD FOR 5 DAYS BEFORE ISSUING A FINAL NEGATIVE REPORT   Report Status PENDING   Incomplete   MRSA PCR SCREENING     Status: Normal   Collection Time   10/12/12  6:28 AM      Component Value Range Status Comment   MRSA by PCR NEGATIVE  NEGATIVE Final   GRAM STAIN     Status: Normal   Collection Time   10/13/12 12:22 PM      Component Value Range Status Comment   Specimen Description FLUID SYNOVIAL KNEE LEFT   Final    Special Requests SYRINGE @ 25CC   Final    Gram Stain     Final    Value: MODERATE WBC PRESENT,BOTH PMN AND MONONUCLEAR     NO ORGANISMS SEEN     SLIDE REVIEWED BY DR [facility] 10/13/12 1410     CALLED TO J WESSELINK,RN 10/13/12 1412 BY K SCHULTZ   Report Status 10/13/2012 FINAL   Final   BODY FLUID CULTURE     Status: Normal (Preliminary result)   Collection Time   10/13/12 12:22 PM      Component Value Range Status Comment   Specimen Description FLUID SYNOVIAL KNEE LEFT   Final    Special Requests SYRINGE @ 25CC   Final    Gram Stain     Final    Value: MODERATE WBC PRESENT,BOTH PMN AND MONONUCLEAR     NO ORGANISMS SEEN     Gram Stain Report Called to,Read Back By and Verified  With: Gram Stain Report Called to,Read Back By and Verified With: Gara Kroner  RN ON 10/13/12 BY K SCHULTZ Performed at [facility]   Culture PENDING   Incomplete    Report Status PENDING   Incomplete     RADIOLOGY STUDIES/RESULTS: Dg Chest 1 View  10/12/2012  *RADIOLOGY REPORT*  Clinical  Data: Evaluate for fluid overload.  Shortness of breath. End-stage renal disease.  CHEST - 1 VIEW  Comparison: 10/11/2012  Findings: Dialysis catheter unchanged in position at mid and low SVC.  Moderate cardiomegaly. No pleural effusion or pneumothorax. Similar mild pulmonary venous congestion.  Mild thickening right minor fissure.  Improved right base aeration with mild atelectasis remaining.  IMPRESSION: Cardiomegaly with similar mild pulmonary venous congestion.  No overt congestive failure.   Original Report Authenticated By: Jeronimo Greaves, M.D.    Dg Chest 2 View  10/11/2012  *RADIOLOGY REPORT*  Clinical Data: Hypoglycemia; weakness.  Left arm and leg pain. Cough.  CHEST - 2 VIEW  Comparison: Chest radiograph performed 09/15/2012  Findings: The lungs are well-aerated.  Vascular congestion is noted.  Mild right basilar airspace opacity could reflect mild interstitial edema.  There is no evidence of pleural effusion or pneumothorax.  The heart is borderline enlarged.  A right-sided dual lumen catheter is noted ending about the distal SVC.  No acute osseous abnormalities are seen.  IMPRESSION: Vascular congestion and borderline cardiomegaly; mild right basilar airspace opacity could reflect mild interstitial edema.   Original Report Authenticated By: Tonia Ghent, M.D.    Dg Chest 2 View  09/14/2012  *RADIOLOGY REPORT*  Clinical Data: Follow-up edema.  Diabetes and hypertension. Dialysis patient  CHEST - 2 VIEW  Comparison: 09/13/2012  Findings: Cardiomegaly is identified and appears stable. There is persistent pulmonary vascular congestion, small bilateral pleural effusions and fissural fluid identified  compatible with mild pulmonary edema.  This is unchanged in comparison with the prior exam.  No new focal infiltrates are identified.  Bony structures demonstrate some degenerative change of the lower thoracic spine.  IMPRESSION: Unchanged pulmonary vascular congestion and mild pulmonary edema pattern.   Original Report Authenticated By: Rhodia Albright, M.D.    Dg Chest Port 1 View  09/15/2012  *RADIOLOGY REPORT*  Clinical Data: Central line placement  PORTABLE CHEST - 1 VIEW  Comparison: Chest radiograph 09/14/2012  Findings: There is interval placement of large bore right central venous line.  Split catheter tips in the distal SVC.  No pneumothorax.  Stable enlarged heart silhouette.  There is central venous congestion and mild pulmonary edema not changed from prior.  IMPRESSION: 1.  Interval placement right central venous line without complication. 2.  Cardiomegaly and mild pulmonary edema.   Original Report Authenticated By: [name], M.D.    Dg Chest Port 1 View  09/13/2012  *RADIOLOGY REPORT*  Clinical Data: Pulmonary edema follow-up  PORTABLE CHEST - 1 VIEW  Comparison: 09/12/2012  Findings: Prominent cardiomediastinal contours.  Central vascular congestion.  Central peribronchial thickening, interstitial, and left greater right perihilar/infrahilar airspace opacities.  No definite pleural effusion.  No pneumothorax.  No interval osseous change.  IMPRESSION: Prominent cardiomediastinal contours with pulmonary edema pattern, similar to prior.   Original Report Authenticated By: Jearld Lesch, M.D.    Dg Chest Portable 1 View  09/12/2012  *RADIOLOGY REPORT*  Clinical Data: Shortness of breath and chest pain after exertion when catches breath, 1 month in duration, history hypertension, diabetes  PORTABLE CHEST - 1 VIEW  Comparison: Portable exam 1354 hours compared to 09/06/2005  Findings: Enlargement of cardiac silhouette with pulmonary vascular congestion. Mild perihilar infiltrates  question edema, infection not excluded. No gross pleural effusion or pneumothorax. Bones unremarkable.  IMPRESSION: Enlargement of cardiac silhouette with pulmonary vascular congestion and mild perihilar infiltrates, slightly greater on left, favor edema over infection.   Original Report Authenticated By: Loraine Leriche  Tyron Russell, M.D.     MEDICATIONS: Scheduled Meds:    . amitriptyline  25 mg Oral QHS  . darbepoetin  60 mcg Intravenous Q7 days  . ferric gluconate (FERRLECIT/NULECIT) IV  62.5 mg Intravenous Weekly  . glipiZIDE  2.5 mg Oral QAC breakfast  . heparin  5,000 Units Subcutaneous Q8H  . [EXPIRED] influenza  inactive virus vaccine  0.5 mL Intramuscular Tomorrow-1000  . insulin aspart  0-9 Units Subcutaneous TID WC  . levofloxacin  500 mg Oral Q48H  . multivitamin  1 tablet Oral Daily  . NIFEdipine  90 mg Oral Daily  . predniSONE  40 mg Oral Q breakfast  . sevelamer  800 mg Oral TID WC  . [DISCONTINUED] predniSONE  40 mg Oral Q breakfast   Continuous Infusions:    . dextrose 5 % and 0.45% NaCl 20 mL/hr at 10/13/12 2042   PRN Meds:.acetaminophen, acetaminophen, alum & mag hydroxide-simeth, docusate sodium, ondansetron (ZOFRAN) IV, ondansetron, oxyCODONE  Antibiotics: Anti-infectives     Start     Dose/Rate Route Frequency Ordered Stop   10/12/12 1300   levofloxacin (LEVAQUIN) tablet 500 mg        500 mg Oral Every 48 hours 10/12/12 1225     10/12/12 0300   cefTRIAXone (ROCEPHIN) 1 g in dextrose 5 % 50 mL IVPB        1 g 100 mL/hr over 30 Minutes Intravenous  Once 10/12/12 0224 10/12/12 Josph Macho, MD  [facility] Pager:[phone]  If 7PM-7AM, please contact night-coverage [email or web address] Password [email or web address] 10/14/2012, 11:08 AM   LOS: 3 days

## 2012-10-14 NOTE — Progress Notes (Signed)
Subjective: Knee effusion fluid sent, TNC 8K with + urate crystals, GS no organisms. Knee is feeling better today.  Objective Vital signs in last 24 hours: Filed Vitals:   10/12/12 1954 10/12/12 2027 10/13/12 0455 10/13/12 2200  BP: 124/62 122/66 151/77 118/70  Pulse: 97 100 106 79  Temp: 98.8 F (37.1 C) 99.4 F (37.4 C) 99.3 F (37.4 C) 99.2 F (37.3 C)  TempSrc: Oral Axillary Oral   Resp: 18 18 24 20   Height:      Weight: 129.6 kg (285 lb 11.5 oz)     SpO2: 98% 94% 94% 93%   Weight change:   Intake/Output Summary (Last 24 hours) at 10/14/12 0450 Last data filed at 10/14/12 0220  Gross per 24 hour  Intake    480 ml  Output   1125 ml  Net   -645 ml   Labs: Basic Metabolic Panel:  Lab 10/13/12 9147 10/12/12 0938 10/11/12 1956  NA 139 139 139  K 4.3 4.5 3.9  CL 100 101 100  CO2 27 25 24   GLUCOSE 163* 83 83  BUN 26* 41* 39*  CREATININE 5.30* 7.91* 7.19*  ALB -- -- --  CALCIUM 9.8 10.2 10.3  PHOS -- -- --   Liver Function Tests: No results found for this basename: AST:3,ALT:3,ALKPHOS:3,BILITOT:3,PROT:3,ALBUMIN:3 in the last 168 hours No results found for this basename: LIPASE:3,AMYLASE:3 in the last 168 hours No results found for this basename: AMMONIA:3 in the last 168 hours CBC:  Lab 10/12/12 0938 10/11/12 1956  WBC 6.0 6.0  NEUTROABS -- 4.0  HGB 11.3* 11.2*  HCT 35.8* 35.3*  MCV 104.1* 105.4*  PLT 234 230   PT/INR: @labrcntip (inr:5) Cardiac Enzymes: No results found for this basename: CKTOTAL:5,CKMB:5,CKMBINDEX:5,TROPONINI:5 in the last 168 hours CBG:  Lab 10/13/12 2156 10/13/12 1711 10/13/12 1219 10/13/12 1011 10/13/12 0747  GLUCAP 378* 256* 213* 238* 211*    Iron Studies: No results found for this basename: IRON:30,TIBC:30,TRANSFERRIN:30,FERRITIN:30 in the last 168 hours  Physical Exam:  Blood pressure 118/70, pulse 79, temperature 99.2 F (37.3 C), temperature source Oral, resp. rate 20, height 6\' 3"  (1.905 m), weight 129.6 kg (285 lb 11.5 oz),  SpO2 93.00%.  Gen: alert, obese AAM, no distress HEENT: EOMI, sclera anicteric, throat clear  Neck: no JVD, no bruits or LAN  Chest: clear bilat, no rales or rhonchi  CV: regular, no rub or gallop, no murmur, pedal pulses 2+ bilat  Abdomen: soft, obese, nontender, + BS, no obvious ascites  Ext: Trace pretibial edema bilat, L knee slightly warm with mod effusion, ROM w slight pain Neuro: alert, Ox3, no focal deficit, no asterixis  Access: LUA AVF which has a reasonable strong bruit, moderately developed. +R upper chest TDC without drainage on dressing.   Outpatient HD: [facility], [name], 4 hr, 132kg, bfr 400/dfr 800, heparin 2600u, 2K/2Ca bath. Zemplar , venofer 50/wk, EPO 10K. Using R IJ TDC, has LUA AVF also.  Impression/Plan  1. Hypoglycemic episode-- glipizide resumed at low dose. BS's stable 2. L knee pain/effusion- effusion tapped, c/w gout, per primary 3. ESRD/recent start 4 wks ago, cont hd [name]- HD today, below dry weight. Keep even 4. Access- has TDC and LUA AVF. Per [facility] cephalic vein fistula is good but too deep and needs superficialization- we are going to ask [facility] to see if they can put him on the schedule for this surgery on Monday, since it looks like he may be here through the weekend and he needs the procedure. I have d/w  patient who is agreeable. 5. HTN/volume- bp 150/85, on procardia 90/d as at home. Below dry wt.   6. Anemia of CKD- give darbe 60/wk in place of EPO, IV fe 50/wk 7. Secondary HPTH- zemplar w HD and Melanee Spry  MD [facility] [phone] pgr    [phone] cell 10/14/2012, 4:50 AM

## 2012-10-14 NOTE — Evaluation (Signed)
Physical Therapy Evaluation Patient Details Name: Nicolas Mccann MRN: [id number] DOB: 1946/02/15 Today's Date: 10/14/2012 Time: 0900-0930 PT Time Calculation (min): 30 min  PT Assessment / Plan / Recommendation Clinical Impression  Pt. was admitted with hypoglycemia and left knee pain/effusion  as well as h/o HTN, ESRD on HD [name], obesity and MBD.  Pt. undederwent arthrocentesis yesterday and results are pending.  He presents to PT with a decreased level of independence in his functional mobility and gait and will need acute PT to assist in these areas.    PT Assessment  Patient needs continued PT services    Follow Up Recommendations  Home health PT;Other (comment) (pt currently receiving HHPT services, will need to resume)    Does the patient have the potential to tolerate intense rehabilitation      Barriers to Discharge None      Equipment Recommendations  None recommended by PT    Recommendations for Other Services     Frequency Min 3X/week    Precautions / Restrictions Precautions Precautions: Fall Restrictions Weight Bearing Restrictions: No   Pertinent Vitals/Pain Pain in left knee 2/10; pt. Positioned for comfort after mobility      Mobility  Bed Mobility Bed Mobility: Supine to Sit;Sit to Supine Supine to Sit: 5: Supervision;With rails;HOB elevated Sit to Supine: Not Tested (comment) Details for Bed Mobility Assistance: Pt. needed extra time to manage left LE due to pain, use of rail Transfers Transfers: Sit to Stand;Stand to Sit Sit to Stand: 4: Min assist;With upper extremity assist;From bed Stand to Sit: 4: Min assist;With upper extremity assist;With armrests;To chair/3-in-1 Details for Transfer Assistance: Pt. needed education on hand placement for standing<>sitting, needed very little amount of physical assist to rise to stand Ambulation/Gait Ambulation/Gait Assistance: 4: Min assist Ambulation Distance (Feet): 3 Feet Assistive device: Rolling  walker Ambulation/Gait Assistance Details: Able to weight bear on left LE without addidtional pain, needed assist for safety and technique with Rw use Gait Pattern: Step-to pattern;Decreased step length - right;Decreased step length - left Stairs: No Wheelchair Mobility Wheelchair Mobility: No    Shoulder Instructions     Exercises     PT Diagnosis: Difficulty walking;Acute pain;Generalized weakness  PT Problem List: Decreased strength;Decreased range of motion;Decreased activity tolerance;Decreased balance;Decreased mobility;Decreased knowledge of use of DME;Obesity PT Treatment Interventions: DME instruction;Gait training;Functional mobility training;Therapeutic activities;Therapeutic exercise;Balance training;Patient/family education   PT Goals Acute Rehab PT Goals PT Goal Formulation: With patient Time For Goal Achievement: 10/21/12 Potential to Achieve Goals: Good Pt will go Supine/Side to Sit: Independently PT Goal: Supine/Side to Sit - Progress: Goal set today Pt will go Sit to Supine/Side: Independently PT Goal: Sit to Supine/Side - Progress: Goal set today Pt will go Sit to Stand: Independently PT Goal: Sit to Stand - Progress: Goal set today Pt will go Stand to Sit: Independently PT Goal: Stand to Sit - Progress: Goal set today Pt will Transfer Bed to Chair/Chair to Bed: Independently PT Transfer Goal: Bed to Chair/Chair to Bed - Progress: Goal set today Pt will Ambulate: 51 - 150 feet;with modified independence;with least restrictive assistive device PT Goal: Ambulate - Progress: Goal set today  Visit Information  Last PT Received On: 10/14/12 Assistance Needed: +1    Subjective Data  Subjective: "I just started dialysis about a month ago" Patient Stated Goal: tolerate walking   Prior Functioning  Home Living Lives With: Spouse;Daughter;Other (Comment) (2 grandchildren, ages 70 and 50) Available Help at Discharge: Family;Available 24 hours/day Type of Home:  House Home Access: Ramped entrance Home Layout: One level Bathroom Shower/Tub: Walk-in Contractor: Handicapped height Bathroom Accessibility: Yes How Accessible: Accessible via walker Home Adaptive Equipment: Walker - rolling;Bedside commode/3-in-1 Prior Function Level of Independence: Independent Able to Take Stairs?: Yes Driving: Yes (pt. is transported to/from HD by family) Vocation: Retired Musician: No difficulties Dominant Hand: Right    Cognition  Overall Cognitive Status: Appears within functional limits for tasks assessed/performed Arousal/Alertness: Awake/alert Orientation Level: Oriented X4 / Intact Behavior During Session: [facility] for tasks performed    Extremity/Trunk Assessment Right Upper Extremity Assessment RUE ROM/Strength/Tone: [facility] for tasks assessed Left Upper Extremity Assessment LUE ROM/Strength/Tone: WFL for tasks assessed Right Lower Extremity Assessment RLE ROM/Strength/Tone: Within functional levels RLE Sensation: WFL - Light Touch Left Lower Extremity Assessment LLE ROM/Strength/Tone: Deficits;Unable to fully assess;Due to pain LLE ROM/Strength/Tone Deficits: full extension of knee to 90 degrees active flexion; strength of quads grossly 3/5; otherwise WFL in left LE LLE Sensation: WFL - Light Touch Trunk Assessment Trunk Assessment: Normal   Balance Balance Balance Assessed: Yes Static Sitting Balance Static Sitting - Balance Support: No upper extremity supported;Feet supported Static Sitting - Level of Assistance: 7: Independent Static Standing Balance Static Standing - Balance Support: Bilateral upper extremity supported;During functional activity Static Standing - Level of Assistance: 5: Stand by assistance  End of Session PT - End of Session Equipment Utilized During Treatment: Gait belt Activity Tolerance: Patient tolerated treatment well Patient left: in chair;with call bell/phone within reach Nurse  Communication: Mobility status  GP     Ferman Hamming 10/14/2012, 9:49 AM Weldon Picking PT Acute Rehab Services [phone] Beeper [phone]

## 2012-10-15 DIAGNOSIS — N185 Chronic kidney disease, stage 5: Secondary | ICD-10-CM

## 2012-10-15 LAB — GLUCOSE, CAPILLARY
Glucose-Capillary: 85 mg/dL (ref 70–99)
Glucose-Capillary: 119 mg/dL — ABNORMAL HIGH (ref 70–99)
Glucose-Capillary: 208 mg/dL — ABNORMAL HIGH (ref 70–99)
Glucose-Capillary: 201 mg/dL — ABNORMAL HIGH (ref 70–99)

## 2012-10-15 MED ORDER — MENTHOL 3 MG MT LOZG
1.0000 | LOZENGE | OROMUCOSAL | Status: DC | PRN
  Filled 2012-10-15: qty 9

## 2012-10-15 MED ORDER — CEFAZOLIN SODIUM-DEXTROSE 2-3 GM-% IV SOLR
2.0000 g | INTRAVENOUS | Status: AC
  Administered 2012-10-16: 2 g via INTRAVENOUS
  Filled 2012-10-15 (×2): qty 50

## 2012-10-15 MED ORDER — PREDNISONE 20 MG PO TABS
30.0000 mg | ORAL_TABLET | Freq: Every day | ORAL | Status: DC
  Administered 2012-10-15: 30 mg via ORAL
  Filled 2012-10-15 (×3): qty 1

## 2012-10-15 NOTE — Consult Note (Signed)
I saw and examined the patient with Mr. Renae Fickle, communicating the findings and plan noted above.  H/o gout with increasing left knee pain and swelling   Musculoskeletal:  Left Lower Extremity L hip, ankle and foot are unremarkable Old medial scar noted to L knee Significant L knee effusion appreciated Pain with ROM of L knee but can get to full extension and to about 50 degrees of flexion, discomfort more so than frank pain Knee stable with exam DPN, SPN, TN sensation intact EHL, FHL, AT, PT, Peroneals and gastrocsoleus motor intact  + DP pulse Skin:  Distal skin changes noted to legs No signs of infecion   Tap positive for gout crystals, no bact  Plan as noted  Myrene Galas, MD [facility] [phone] (p)

## 2012-10-15 NOTE — Progress Notes (Signed)
I agree with the findings above.  Budd Palmer, MD 10/15/2012 9:37 AM

## 2012-10-15 NOTE — Progress Notes (Signed)
Patient ID: Nicolas Mccann, male   DOB: 1946/10/05, 66 y.o.   MRN: [id number] The patient remains medically stable. We have been requested to proceed with the superficial mobilization of his left arm AV fistula while an inpatient. This is been added to the schedule tomorrow. I discussed again the procedure in detail with the patient who understands. He should be ready for discharge at any time following the procedure including tomorrow afternoon since this would typically be an outpatient procedure. He understands this will hopefully provide better access to his fistula but may continue to have difficulty. He dialyzed successfully yesterday via his hemodialysis catheter

## 2012-10-15 NOTE — Progress Notes (Signed)
PATIENT DETAILS Name: Nicolas Mccann Age: 66 y.o. Sex: male Date of Birth: 29-Jun-1946 Admit Date: 10/11/2012 Admitting Physician Gery Pray, MD ZOX:[id number], MD  Subjective: Awake and alert-left knee much better today  Assessment/Plan: Active Problems: Hypoglycemia -2/2 to use of long acting Glipizide in the setting of ESRD-resolved -CBG's back up-started low dose Glipizide 12/7 (not long acting), also on low dose SSI -repeat A1C-5.6-will allow some permissive hyperglycemia   HTN -controlled -c/w Procardia  ESRD -per Renal  Left Knee pain-Acute Gouty Arthritis -taper prednisone -had knee tap 12/6-+crystals-cultures neg so far  Acute Bronchitis -maintain on levaquin -afebrile now -blood cultures neg so far  Anemia -per renal -2/2 renal disease    Obesity -counseled regarding weight loss  Metabolic bone disease -per renal  Disposition: Remain inpatient  DVT Prophylaxis: Prophylactic  Heparin  Code Status: Full code  Procedures:  None  CONSULTS:  nephrology ortho  PHYSICAL EXAM: Vital signs in last 24 hours: Filed Vitals:   10/14/12 1742 10/14/12 1752 10/14/12 2110 10/15/12 0500  BP: 118/71 120/68 106/61 114/72  Pulse: 98 108 106 93  Temp:  98 F (36.7 C) 98.5 F (36.9 C) 98.7 F (37.1 C)  TempSrc:  Oral Oral Oral  Resp: 16 17 18 18   Height:      Weight:  130.2 kg (287 lb 0.6 oz)    SpO2:  95% 96% 95%    Weight change:  Body mass index is 35.88 kg/(m^2).   Gen Exam: Awake and alert with clear speech.  Neck: Supple, No JVD.   Chest: B/L Clear.   CVS: S1 S2 Regular, no murmurs.  Abdomen: soft, BS +, non tender, non distended.  Extremities: no edema, lower extremities warm to touch.Left knee slight swollen and erythematous Neurologic: Non Focal.   Skin: No Rash.   Wounds: N/A.    Intake/Output from previous day:  Intake/Output Summary (Last 24 hours) at 10/15/12 1112 Last data filed at 10/15/12 0900  Gross per 24 hour   Intake    598 ml  Output    500 ml  Net     98 ml     LAB RESULTS: CBC  Lab 10/14/12 0605 10/12/12 0938 10/11/12 1956  WBC 10.6* 6.0 6.0  HGB 11.1* 11.3* 11.2*  HCT 34.6* 35.8* 35.3*  PLT 274 234 230  MCV 102.1* 104.1* 105.4*  MCH 32.7 32.8 33.4  MCHC 32.1 31.6 31.7  RDW 15.6* 16.4* 16.5*  LYMPHSABS -- -- 1.1  MONOABS -- -- 0.8  EOSABS -- -- 0.1  BASOSABS -- -- 0.0  BANDABS -- -- --    Chemistries   Lab 10/14/12 0605 10/13/12 0700 10/12/12 0938 10/11/12 1956  NA 137 139 139 139  K 5.1 4.3 4.5 3.9  CL 99 100 101 100  CO2 20 27 25 24   GLUCOSE 228* 163* 83 83  BUN 50* 26* 41* 39*  CREATININE 7.04* 5.30* 7.91* 7.19*  CALCIUM 10.2 9.8 10.2 10.3  MG -- -- -- --    CBG:  Lab 10/15/12 0758 10/14/12 2109 10/14/12 1806 10/14/12 1156 10/14/12 0758  GLUCAP 85 247* 201* 197* 196*    GFR Estimated Creatinine Clearance: 15 ml/min (by C-G formula based on Cr of 7.04).  Coagulation profile No results found for this basename: INR:5,PROTIME:5 in the last 168 hours  Cardiac Enzymes No results found for this basename: CK:3,CKMB:3,TROPONINI:3,MYOGLOBIN:3 in the last 168 hours  No components found with this basename: POCBNP:3 No results found for this basename: DDIMER:2 in the last  72 hours  Basename 10/12/12 1216  HGBA1C 5.6   No results found for this basename: CHOL:2,HDL:2,LDLCALC:2,TRIG:2,CHOLHDL:2,LDLDIRECT:2 in the last 72 hours No results found for this basename: TSH,T4TOTAL,FREET3,T3FREE,THYROIDAB in the last 72 hours No results found for this basename: VITAMINB12:2,FOLATE:2,FERRITIN:2,TIBC:2,IRON:2,RETICCTPCT:2 in the last 72 hours No results found for this basename: LIPASE:2,AMYLASE:2 in the last 72 hours  Urine Studies No results found for this basename: UACOL:2,UAPR:2,USPG:2,UPH:2,UTP:2,UGL:2,UKET:2,UBIL:2,UHGB:2,UNIT:2,UROB:2,ULEU:2,UEPI:2,UWBC:2,URBC:2,UBAC:2,CAST:2,CRYS:2,UCOM:2,BILUA:2 in the last 72 hours  MICROBIOLOGY: Recent Results (from the past  240 hour(s))  CULTURE, BLOOD (ROUTINE X 2)     Status: Normal (Preliminary result)   Collection Time   10/11/12  9:50 PM      Component Value Range Status Comment   Specimen Description BLOOD RIGHT HAND   Final    Special Requests BOTTLES DRAWN AEROBIC ONLY 3CC   Final    Culture  Setup Time 10/12/2012 04:40   Final    Culture     Final    Value:        BLOOD CULTURE RECEIVED NO GROWTH TO DATE CULTURE WILL BE HELD FOR 5 DAYS BEFORE ISSUING A FINAL NEGATIVE REPORT   Report Status PENDING   Incomplete   CULTURE, BLOOD (ROUTINE X 2)     Status: Normal (Preliminary result)   Collection Time   10/11/12 10:23 PM      Component Value Range Status Comment   Specimen Description BLOOD RIGHT HAND   Final    Special Requests BOTTLES DRAWN AEROBIC ONLY 5CC   Final    Culture  Setup Time 10/12/2012 04:40   Final    Culture     Final    Value:        BLOOD CULTURE RECEIVED NO GROWTH TO DATE CULTURE WILL BE HELD FOR 5 DAYS BEFORE ISSUING A FINAL NEGATIVE REPORT   Report Status PENDING   Incomplete   MRSA PCR SCREENING     Status: Normal   Collection Time   10/12/12  6:28 AM      Component Value Range Status Comment   MRSA by PCR NEGATIVE  NEGATIVE Final   CULTURE, BLOOD (ROUTINE X 2)     Status: Normal (Preliminary result)   Collection Time   10/13/12 11:16 AM      Component Value Range Status Comment   Specimen Description BLOOD RIGHT HAND   Final    Special Requests BOTTLES DRAWN AEROBIC ONLY 10CC   Final    Culture  Setup Time 10/13/2012 15:38   Final    Culture     Final    Value:        BLOOD CULTURE RECEIVED NO GROWTH TO DATE CULTURE WILL BE HELD FOR 5 DAYS BEFORE ISSUING A FINAL NEGATIVE REPORT   Report Status PENDING   Incomplete   CULTURE, BLOOD (ROUTINE X 2)     Status: Normal (Preliminary result)   Collection Time   10/13/12 11:22 AM      Component Value Range Status Comment   Specimen Description BLOOD RIGHT HAND   Final    Special Requests BOTTLES DRAWN AEROBIC ONLY 10CC   Final     Culture  Setup Time 10/13/2012 15:38   Final    Culture     Final    Value:        BLOOD CULTURE RECEIVED NO GROWTH TO DATE CULTURE WILL BE HELD FOR 5 DAYS BEFORE ISSUING A FINAL NEGATIVE REPORT   Report Status PENDING   Incomplete   GRAM STAIN  Status: Normal   Collection Time   10/13/12 12:22 PM      Component Value Range Status Comment   Specimen Description FLUID SYNOVIAL KNEE LEFT   Final    Special Requests SYRINGE @ 25CC   Final    Gram Stain     Final    Value: MODERATE WBC PRESENT,BOTH PMN AND MONONUCLEAR     NO ORGANISMS SEEN     SLIDE REVIEWED BY DR [facility] 10/13/12 1410     CALLED TO J WESSELINK,RN 10/13/12 1412 BY K SCHULTZ   Report Status 10/13/2012 FINAL   Final   BODY FLUID CULTURE     Status: Normal (Preliminary result)   Collection Time   10/13/12 12:22 PM      Component Value Range Status Comment   Specimen Description FLUID SYNOVIAL KNEE LEFT   Final    Special Requests SYRINGE @ 25CC   Final    Gram Stain     Final    Value: MODERATE WBC PRESENT,BOTH PMN AND MONONUCLEAR     NO ORGANISMS SEEN     Gram Stain Report Called to,Read Back By and Verified With: Gram Stain Report Called to,Read Back By and Verified With: Gara Kroner  RN ON 10/13/12 BY K SCHULTZ Performed at [facility]   Culture NO GROWTH 1 DAY   Final    Report Status PENDING   Incomplete     RADIOLOGY STUDIES/RESULTS: Dg Chest 1 View  10/12/2012  *RADIOLOGY REPORT*  Clinical Data: Evaluate for fluid overload.  Shortness of breath. End-stage renal disease.  CHEST - 1 VIEW  Comparison: 10/11/2012  Findings: Dialysis catheter unchanged in position at mid and low SVC.  Moderate cardiomegaly. No pleural effusion or pneumothorax. Similar mild pulmonary venous congestion.  Mild thickening right minor fissure.  Improved right base aeration with mild atelectasis remaining.  IMPRESSION: Cardiomegaly with similar mild pulmonary venous congestion.  No overt congestive failure.   Original Report Authenticated By:  Jeronimo Greaves, M.D.    Dg Chest 2 View  10/11/2012  *RADIOLOGY REPORT*  Clinical Data: Hypoglycemia; weakness.  Left arm and leg pain. Cough.  CHEST - 2 VIEW  Comparison: Chest radiograph performed 09/15/2012  Findings: The lungs are well-aerated.  Vascular congestion is noted.  Mild right basilar airspace opacity could reflect mild interstitial edema.  There is no evidence of pleural effusion or pneumothorax.  The heart is borderline enlarged.  A right-sided dual lumen catheter is noted ending about the distal SVC.  No acute osseous abnormalities are seen.  IMPRESSION: Vascular congestion and borderline cardiomegaly; mild right basilar airspace opacity could reflect mild interstitial edema.   Original Report Authenticated By: Tonia Ghent, M.D.    Dg Chest 2 View  09/14/2012  *RADIOLOGY REPORT*  Clinical Data: Follow-up edema.  Diabetes and hypertension. Dialysis patient  CHEST - 2 VIEW  Comparison: 09/13/2012  Findings: Cardiomegaly is identified and appears stable. There is persistent pulmonary vascular congestion, small bilateral pleural effusions and fissural fluid identified compatible with mild pulmonary edema.  This is unchanged in comparison with the prior exam.  No new focal infiltrates are identified.  Bony structures demonstrate some degenerative change of the lower thoracic spine.  IMPRESSION: Unchanged pulmonary vascular congestion and mild pulmonary edema pattern.   Original Report Authenticated By: Rhodia Albright, M.D.    Dg Chest Port 1 View  09/15/2012  *RADIOLOGY REPORT*  Clinical Data: Central line placement  PORTABLE CHEST - 1 VIEW  Comparison: Chest radiograph 09/14/2012  Findings:  There is interval placement of large bore right central venous line.  Split catheter tips in the distal SVC.  No pneumothorax.  Stable enlarged heart silhouette.  There is central venous congestion and mild pulmonary edema not changed from prior.  IMPRESSION: 1.  Interval placement right central venous line  without complication. 2.  Cardiomegaly and mild pulmonary edema.   Original Report Authenticated By: [name], M.D.    Dg Chest Port 1 View  09/13/2012  *RADIOLOGY REPORT*  Clinical Data: Pulmonary edema follow-up  PORTABLE CHEST - 1 VIEW  Comparison: 09/12/2012  Findings: Prominent cardiomediastinal contours.  Central vascular congestion.  Central peribronchial thickening, interstitial, and left greater right perihilar/infrahilar airspace opacities.  No definite pleural effusion.  No pneumothorax.  No interval osseous change.  IMPRESSION: Prominent cardiomediastinal contours with pulmonary edema pattern, similar to prior.   Original Report Authenticated By: Jearld Lesch, M.D.    Dg Chest Portable 1 View  09/12/2012  *RADIOLOGY REPORT*  Clinical Data: Shortness of breath and chest pain after exertion when catches breath, 1 month in duration, history hypertension, diabetes  PORTABLE CHEST - 1 VIEW  Comparison: Portable exam 1354 hours compared to 09/06/2005  Findings: Enlargement of cardiac silhouette with pulmonary vascular congestion. Mild perihilar infiltrates question edema, infection not excluded. No gross pleural effusion or pneumothorax. Bones unremarkable.  IMPRESSION: Enlargement of cardiac silhouette with pulmonary vascular congestion and mild perihilar infiltrates, slightly greater on left, favor edema over infection.   Original Report Authenticated By: Ulyses Southward, M.D.     MEDICATIONS: Scheduled Meds:    . albuterol  2.5 mg Nebulization TID  . amitriptyline  25 mg Oral QHS  .  ceFAZolin (ANCEF) IV  2 g Intravenous On Call  . [COMPLETED] darbepoetin      . darbepoetin  60 mcg Intravenous Q7 days  . ferric gluconate (FERRLECIT/NULECIT) IV  62.5 mg Intravenous Weekly  . glipiZIDE  2.5 mg Oral QAC breakfast  . guaiFENesin  600 mg Oral BID  . heparin  5,000 Units Subcutaneous Q8H  . [COMPLETED] influenza  inactive virus vaccine  0.5 mL Intramuscular Tomorrow-1000  . insulin  aspart  0-9 Units Subcutaneous TID WC  . levofloxacin  500 mg Oral Q48H  . multivitamin  1 tablet Oral Daily  . NIFEdipine  90 mg Oral Daily  . predniSONE  30 mg Oral Q breakfast  . sevelamer  800 mg Oral TID WC  . [DISCONTINUED] predniSONE  40 mg Oral Q breakfast   Continuous Infusions:    . [DISCONTINUED] dextrose 5 % and 0.45% NaCl 20 mL/hr at 10/13/12 2042   PRN Meds:.acetaminophen, acetaminophen, alum & mag hydroxide-simeth, docusate sodium, menthol-cetylpyridinium, ondansetron (ZOFRAN) IV, ondansetron, oxyCODONE, [DISCONTINUED] sodium chloride, [DISCONTINUED] sodium chloride, [DISCONTINUED] alteplase, [DISCONTINUED] feeding supplement (NEPRO CARB STEADY), [DISCONTINUED] heparin, [DISCONTINUED] heparin, [DISCONTINUED] lidocaine, [DISCONTINUED] lidocaine-prilocaine, [DISCONTINUED] pentafluoroprop-tetrafluoroeth  Antibiotics: Anti-infectives     Start     Dose/Rate Route Frequency Ordered Stop   10/16/12 0600   ceFAZolin (ANCEF) IVPB 2 g/50 mL premix     Comments: Send with pt to OR      2 g 100 mL/hr over 30 Minutes Intravenous On call 10/15/12 0922 10/17/12 0600   10/12/12 1300   levofloxacin (LEVAQUIN) tablet 500 mg        500 mg Oral Every 48 hours 10/12/12 1225     10/12/12 0300   cefTRIAXone (ROCEPHIN) 1 g in dextrose 5 % 50 mL IVPB        1 g  100 mL/hr over 30 Minutes Intravenous  Once 10/12/12 0224 10/12/12 Josph Macho, MD  [facility] Pager:[phone]  If 7PM-7AM, please contact night-coverage [email or web address] Password [email or web address] 10/15/2012, 11:12 AM   LOS: 4 days

## 2012-10-15 NOTE — Progress Notes (Signed)
Subjective:  No cos Objective Vital signs in last 24 hours: Filed Vitals:   10/14/12 1742 10/14/12 1752 10/14/12 2110 10/15/12 0500  BP: 118/71 120/68 106/61 114/72  Pulse: 98 108 106 93  Temp:  98 F (36.7 C) 98.5 F (36.9 C) 98.7 F (37.1 C)  TempSrc:  Oral Oral Oral  Resp: 16 17 18 18   Height:      Weight:  130.2 kg (287 lb 0.6 oz)    SpO2:  95% 96% 95%   Weight change:   Intake/Output Summary (Last 24 hours) at 10/15/12 1229 Last data filed at 10/15/12 0900  Gross per 24 hour  Intake    598 ml  Output    500 ml  Net     98 ml   Labs: Basic Metabolic Panel:  Lab 10/14/12 1610 10/13/12 0700 10/12/12 0938  NA 137 139 139  K 5.1 4.3 4.5  CL 99 100 101  CO2 20 27 25   GLUCOSE 228* 163* 83  BUN 50* 26* 41*  CREATININE 7.04* 5.30* 7.91*  CALCIUM 10.2 9.8 10.2  ALB -- -- --  PHOS -- -- --   Liver Function Tests: No results found for this basename: AST:3,ALT:3,ALKPHOS:3,BILITOT:3,PROT:3,ALBUMIN:3 in the last 168 hours No results found for this basename: LIPASE:3,AMYLASE:3 in the last 168 hours No results found for this basename: AMMONIA:3 in the last 168 hours CBC:  Lab 10/14/12 0605 10/12/12 0938 10/11/12 1956  WBC 10.6* 6.0 6.0  NEUTROABS -- -- 4.0  HGB 11.1* 11.3* 11.2*  HCT 34.6* 35.8* 35.3*  MCV 102.1* 104.1* 105.4*  PLT 274 234 230   Cardiac Enzymes: No results found for this basename: CKTOTAL:5,CKMB:5,CKMBINDEX:5,TROPONINI:5 in the last 168 hours CBG:  Lab 10/15/12 1150 10/15/12 0758 10/14/12 2109 10/14/12 1806 10/14/12 1156  GLUCAP 119* 85 247* 201* 197*    Iron Studies: No results found for this basename: IRON,TIBC,TRANSFERRIN,FERRITIN in the last 72 hours Studies/Results: No results found. Medications:      . albuterol  2.5 mg Nebulization TID  . amitriptyline  25 mg Oral QHS  .  ceFAZolin (ANCEF) IV  2 g Intravenous On Call  . [COMPLETED] darbepoetin      . darbepoetin  60 mcg Intravenous Q7 days  . ferric gluconate (FERRLECIT/NULECIT)  IV  62.5 mg Intravenous Weekly  . glipiZIDE  2.5 mg Oral QAC breakfast  . guaiFENesin  600 mg Oral BID  . heparin  5,000 Units Subcutaneous Q8H  . insulin aspart  0-9 Units Subcutaneous TID WC  . levofloxacin  500 mg Oral Q48H  . multivitamin  1 tablet Oral Daily  . NIFEdipine  90 mg Oral Daily  . predniSONE  30 mg Oral Q breakfast  . sevelamer  800 mg Oral TID WC  . [DISCONTINUED] predniSONE  40 mg Oral Q breakfast   I  have reviewed scheduled and prn medications.   Physical Exam: Gen: alert,NAD,. Sitting up in bed to eat lunch Chest: CTA bilat., no rales or rhonchi  CV: RRR, no rub or gallop, no murmur Abdomen: soft, obese, nontender, + BS Ext: Trace pretibial edema bilat, L knee slightly  Swollen Access: LUA AVF positive bruit/. Right ij perm cath   Outpatient HD: [facility], [name], 4 hr, 132kg, bfr 400/dfr 800, heparin 2600u, 2K/2Ca bath. Zemplar , venofer 50/wk, EPO 10K. Using R IJ TDC, has LUA AVF also .  Impression/Plan  1. Hypoglycemic episode-- glipizide resumed at low dose. BS's stable 119 2. L knee pain/effusion- ortho signed off yesterday/ effusion  tapped, c/w gout, per primary/ on Prednisone taper 3. ESRD/recent start 4 wks ago at [facility]  cont hd [name]- HD tolerated yesterday post wt. =130.2 kg below dry weight. New lower edw 4. Access- has TDC and LUA AVF.Tomorrow  Noted per Dr. Arbie Cookey plans surgery=cephalic vein fistula is good but too deep and needs superficialization-  5. HTN/volume-  on procardia 90/d as at home. Below dry wt.  114/72 bp 6. Anemia of CKD- give darbe 60/wk in place of EPO, IV fe 50/wk hgb 11.1 7. Secondary HPTH- Renvela  Phos 6.5 and Ca 10.2 with Alb 3.6. Off zemplar with ca  Sl high. Fu op center with reck PTH per protocal  As op.   Nicolas Pastel, PA-C [facility] Beeper [phone] 10/15/2012,12:29 PM  LOS: 4 days   Patient seen and examined and agree with assessment and plan as above. For AVF revision procedure per [facility] tomorrow,  appreciate surgery assistance. Can be d/c'd after procedure tomorrow per surgery if stable.  Vinson Moselle  MD [facility] [phone] pgr    [phone] cell 10/15/2012, 3:45 PM

## 2012-10-16 ENCOUNTER — Inpatient Hospital Stay (HOSPITAL_COMMUNITY): Payer: Medicare Other | Admitting: Anesthesiology

## 2012-10-16 ENCOUNTER — Encounter (HOSPITAL_COMMUNITY): Payer: Self-pay | Admitting: Anesthesiology

## 2012-10-16 ENCOUNTER — Encounter (HOSPITAL_COMMUNITY)
Admission: EM | Disposition: A | Discharge status: Home / Self Care | Payer: Self-pay | Source: Home / Self Care | Attending: Internal Medicine

## 2012-10-16 DIAGNOSIS — I10 Essential (primary) hypertension: Secondary | ICD-10-CM

## 2012-10-16 LAB — BASIC METABOLIC PANEL
CO2: 25 mEq/L (ref 19–32)
Calcium: 9.9 mg/dL (ref 8.4–10.5)
Creatinine, Ser: 6.85 mg/dL — ABNORMAL HIGH (ref 0.50–1.35)

## 2012-10-16 LAB — GLUCOSE, CAPILLARY

## 2012-10-16 SURGERY — FISTULA SUPERFICIALIZATION
Anesthesia: General | Site: Arm Upper | Laterality: Left | Wound class: Clean

## 2012-10-16 MED ORDER — PREDNISONE 20 MG PO TABS: 20.0000 mg | ORAL_TABLET | Freq: Every day | ORAL | Status: DC

## 2012-10-16 MED ORDER — PHENYLEPHRINE HCL 10 MG/ML IJ SOLN
INTRAMUSCULAR | Status: DC | PRN
  Administered 2012-10-16: 80 ug via INTRAVENOUS
  Administered 2012-10-16 (×2): 120 ug via INTRAVENOUS
  Administered 2012-10-16: 80 ug via INTRAVENOUS

## 2012-10-16 MED ORDER — ACETAMINOPHEN 10 MG/ML IV SOLN: 1000.0000 mg | Freq: Once | INTRAVENOUS | Status: DC | PRN

## 2012-10-16 MED ORDER — ONDANSETRON HCL 4 MG/2ML IJ SOLN
INTRAMUSCULAR | Status: DC | PRN
  Administered 2012-10-16: 4 mg via INTRAVENOUS

## 2012-10-16 MED ORDER — HEPARIN SODIUM (PORCINE) 1000 UNIT/ML IJ SOLN
INTRAMUSCULAR | Status: AC
  Filled 2012-10-16: qty 1

## 2012-10-16 MED ORDER — LEVOFLOXACIN 500 MG PO TABS: 500.0000 mg | ORAL_TABLET | ORAL | Status: DC

## 2012-10-16 MED ORDER — 0.9 % SODIUM CHLORIDE (POUR BTL) OPTIME
TOPICAL | Status: DC | PRN
  Administered 2012-10-16: 1000 mL

## 2012-10-16 MED ORDER — HYDROMORPHONE HCL PF 1 MG/ML IJ SOLN
INTRAMUSCULAR | Status: AC
  Filled 2012-10-16: qty 1

## 2012-10-16 MED ORDER — LIDOCAINE-EPINEPHRINE 0.5 %-1:200000 IJ SOLN
INTRAMUSCULAR | Status: AC
  Filled 2012-10-16: qty 1

## 2012-10-16 MED ORDER — OXYCODONE HCL 5 MG PO TABS: 5.0000 mg | ORAL_TABLET | Freq: Four times a day (QID) | ORAL | Status: DC | PRN

## 2012-10-16 MED ORDER — ONDANSETRON HCL 4 MG/2ML IJ SOLN: 4.0000 mg | Freq: Once | INTRAMUSCULAR | Status: DC | PRN

## 2012-10-16 MED ORDER — GLIPIZIDE 2.5 MG HALF TABLET: 2.5000 mg | ORAL_TABLET | Freq: Every day | ORAL | Status: DC

## 2012-10-16 MED ORDER — SODIUM CHLORIDE 0.9 % IV SOLN: INTRAVENOUS | Status: DC

## 2012-10-16 MED ORDER — SODIUM CHLORIDE 0.9 % IR SOLN
Status: DC | PRN
  Administered 2012-10-16: 13:00:00

## 2012-10-16 MED ORDER — SODIUM CHLORIDE 0.9 % IV SOLN
10.0000 mg | INTRAVENOUS | Status: DC | PRN
  Administered 2012-10-16: 50 ug/min via INTRAVENOUS

## 2012-10-16 MED ORDER — SODIUM CHLORIDE 0.9 % IV SOLN
INTRAVENOUS | Status: DC | PRN
  Administered 2012-10-16 (×2): via INTRAVENOUS

## 2012-10-16 MED ORDER — HYDROMORPHONE HCL PF 1 MG/ML IJ SOLN
0.2500 mg | INTRAMUSCULAR | Status: DC | PRN
  Administered 2012-10-16: 0.5 mg via INTRAVENOUS

## 2012-10-16 MED ORDER — LIDOCAINE HCL (CARDIAC) 20 MG/ML IV SOLN
INTRAVENOUS | Status: DC | PRN
  Administered 2012-10-16: 60 mg via INTRAVENOUS

## 2012-10-16 MED ORDER — PROPOFOL 10 MG/ML IV BOLUS
INTRAVENOUS | Status: DC | PRN
  Administered 2012-10-16: 160 mg via INTRAVENOUS

## 2012-10-16 MED ORDER — PREDNISONE 20 MG PO TABS
20.0000 mg | ORAL_TABLET | Freq: Every day | ORAL | Status: DC
  Administered 2012-10-16: 20 mg via ORAL
  Filled 2012-10-16 (×2): qty 1

## 2012-10-16 MED ORDER — FENTANYL CITRATE 0.05 MG/ML IJ SOLN
INTRAMUSCULAR | Status: DC | PRN
  Administered 2012-10-16: 50 ug via INTRAVENOUS
  Administered 2012-10-16: 100 ug via INTRAVENOUS

## 2012-10-16 MED ORDER — EPHEDRINE SULFATE 50 MG/ML IJ SOLN
INTRAMUSCULAR | Status: DC | PRN
  Administered 2012-10-16 (×2): 15 mg via INTRAVENOUS
  Administered 2012-10-16 (×2): 10 mg via INTRAVENOUS

## 2012-10-16 SURGICAL SUPPLY — 44 items
BANDAGE ELASTIC 6 VELCRO ST LF (GAUZE/BANDAGES/DRESSINGS) ×3 IMPLANT
BANDAGE GAUZE ELAST BULKY 4 IN (GAUZE/BANDAGES/DRESSINGS) ×3 IMPLANT
BENZOIN TINCTURE PRP APPL 2/3 (GAUZE/BANDAGES/DRESSINGS) ×3 IMPLANT
CANISTER SUCTION 2500CC (MISCELLANEOUS) ×3 IMPLANT
CATH EMB 4FR 80CM (CATHETERS) ×3 IMPLANT
CLIP LIGATING EXTRA MED SLVR (CLIP) ×3 IMPLANT
CLIP LIGATING EXTRA SM BLUE (MISCELLANEOUS) ×3 IMPLANT
CLOTH BEACON ORANGE TIMEOUT ST (SAFETY) ×3 IMPLANT
CLSR STERI-STRIP ANTIMIC 1/2X4 (GAUZE/BANDAGES/DRESSINGS) ×3 IMPLANT
COVER PROBE W GEL 5X96 (DRAPES) ×3 IMPLANT
COVER SURGICAL LIGHT HANDLE (MISCELLANEOUS) ×3 IMPLANT
DECANTER SPIKE VIAL GLASS SM (MISCELLANEOUS) ×3 IMPLANT
DERMABOND ADVANCED (GAUZE/BANDAGES/DRESSINGS) ×1
DERMABOND ADVANCED .7 DNX12 (GAUZE/BANDAGES/DRESSINGS) ×2 IMPLANT
ELECT REM PT RETURN 9FT ADLT (ELECTROSURGICAL) ×3
ELECTRODE REM PT RTRN 9FT ADLT (ELECTROSURGICAL) ×2 IMPLANT
GEL ULTRASOUND 20GR AQUASONIC (MISCELLANEOUS) ×3 IMPLANT
GLOVE BIO SURGEON STRL SZ 6.5 (GLOVE) ×6 IMPLANT
GLOVE BIOGEL PI IND STRL 6.5 (GLOVE) ×2 IMPLANT
GLOVE BIOGEL PI IND STRL 7.0 (GLOVE) ×6 IMPLANT
GLOVE BIOGEL PI IND STRL 7.5 (GLOVE) ×2 IMPLANT
GLOVE BIOGEL PI INDICATOR 6.5 (GLOVE) ×1
GLOVE BIOGEL PI INDICATOR 7.0 (GLOVE) ×3
GLOVE BIOGEL PI INDICATOR 7.5 (GLOVE) ×1
GLOVE SS BIOGEL STRL SZ 7 (GLOVE) ×2 IMPLANT
GLOVE SS BIOGEL STRL SZ 7.5 (GLOVE) ×2 IMPLANT
GLOVE SUPERSENSE BIOGEL SZ 7 (GLOVE) ×1
GLOVE SUPERSENSE BIOGEL SZ 7.5 (GLOVE) ×1
GOWN STRL NON-REIN LRG LVL3 (GOWN DISPOSABLE) ×9 IMPLANT
KIT BASIN OR (CUSTOM PROCEDURE TRAY) ×3 IMPLANT
KIT ROOM TURNOVER OR (KITS) ×3 IMPLANT
NS IRRIG 1000ML POUR BTL (IV SOLUTION) ×3 IMPLANT
PACK CV ACCESS (CUSTOM PROCEDURE TRAY) ×3 IMPLANT
PAD ARMBOARD 7.5X6 YLW CONV (MISCELLANEOUS) ×6 IMPLANT
SPONGE GAUZE 4X4 12PLY (GAUZE/BANDAGES/DRESSINGS) ×3 IMPLANT
STRIP CLOSURE SKIN 1/2X4 (GAUZE/BANDAGES/DRESSINGS) ×3 IMPLANT
SUT PROLENE 6 0 CC (SUTURE) ×3 IMPLANT
SUT VIC AB 3-0 SH 27 (SUTURE) ×3
SUT VIC AB 3-0 SH 27X BRD (SUTURE) ×6 IMPLANT
TAPE CLOTH SURG 6X10 WHT LF (GAUZE/BANDAGES/DRESSINGS) ×3 IMPLANT
TOWEL OR 17X24 6PK STRL BLUE (TOWEL DISPOSABLE) ×3 IMPLANT
TOWEL OR 17X26 10 PK STRL BLUE (TOWEL DISPOSABLE) ×3 IMPLANT
UNDERPAD 30X30 INCONTINENT (UNDERPADS AND DIAPERS) ×3 IMPLANT
WATER STERILE IRR 1000ML POUR (IV SOLUTION) ×3 IMPLANT

## 2012-10-16 NOTE — Progress Notes (Signed)
Vascular Surgery   The patient may be discharge and he will follow up with Dr. Arbie Cookey in 4 weeks.  Do Not use fistula until after follow up visit.  Arnetra Terris MAUREEN PA-C

## 2012-10-16 NOTE — Progress Notes (Signed)
Pt received in holding.  Present IV flushes w/ 3ml of NS w/out difficulty.  Saline Lock connected w/ of NS.

## 2012-10-16 NOTE — Transfer of Care (Signed)
Immediate Anesthesia Transfer of Care Note  Patient: Nicolas Mccann  Procedure(s) Performed: Procedure(s) (LRB) with comments: FISTULA SUPERFICIALIZATION (Left) - Ultrasound Guided  Patient Location: PACU  Anesthesia Type:General  Level of Consciousness: awake, alert , oriented and patient cooperative  Airway & Oxygen Therapy: Patient Spontanous Breathing and Patient connected to nasal cannula oxygen  Post-op Assessment: Report given to PACU RN, Post -op Vital signs reviewed and stable and Patient moving all extremities X 4  Post vital signs: Reviewed and stable  Complications: No apparent anesthesia complications

## 2012-10-16 NOTE — Progress Notes (Signed)
Patient discharge teaching given, including activity, diet, follow-up appoints, and medications. Patient verbalized understanding of all discharge instructions. IV access was d/c'd. Vitals are stable. Skin is intact except as charted in most recent assessments. Pt to be escorted out by NT, to be driven home by family. 

## 2012-10-16 NOTE — Interval H&P Note (Signed)
History and Physical Interval Note:  10/16/2012 7:21 AM  Nicolas Mccann  has presented today for surgery, with the diagnosis of /  The various methods of treatment have been discussed with the patient and family. After consideration of risks, benefits and other options for treatment, the patient has consented to  Procedure(s) (LRB) with comments: THROMBECTOMY AND REVISION OF ARTERIOVENTOUS (AV) GORETEX  GRAFT (Left) as a surgical intervention .  The patient's history has been reviewed, patient examined, no change in status, stable for surgery.  I have reviewed the patient's chart and labs.  Questions were answered to the patient's satisfaction.     EARLY, TODD

## 2012-10-16 NOTE — Discharge Summary (Signed)
PATIENT DETAILS Name: Nicolas Mccann Age: 66 y.o. Sex: male Date of Birth: 1946/06/25 MRN: [id number]. Admit Date: 10/11/2012 Admitting Physician: Gery Pray, MD NWG:NFAOZHY,QMVHQION, MD  Recommendations for Outpatient Follow-up:  1. Avoid aggressive treatment of Diabetes-as patient was hypoglycemic this admission  PRIMARY DISCHARGE DIAGNOSIS:  Active Problems:  Diabetes mellitus with renal complications  ESRD on dialysis  Hypoglycemia  Obesity      PAST MEDICAL HISTORY: Past Medical History  Diagnosis Date  . Diabetes mellitus without complication   . ESRD on hemodialysis     [name], [name], ESRD due to DM/HTN, started dialysis in Sep 12, 2012  . Obesity   . Gout     DISCHARGE MEDICATIONS:   Medication List     As of 10/16/2012  2:48 PM    STOP taking these medications         glipiZIDE 5 MG 24 hr tablet   Commonly known as: GLUCOTROL XL      oxyCODONE 5 MG immediate release tablet   Commonly known as: Oxy IR/ROXICODONE      sodium bicarbonate 650 MG tablet      TAKE these medications         amitriptyline 25 MG tablet   Commonly known as: ELAVIL   Take 25 mg by mouth at bedtime.      docusate sodium 100 MG capsule   Commonly known as: COLACE   Take 100 mg by mouth daily as needed. For hard stool      glipiZIDE 2.5 mg Tabs   Commonly known as: GLUCOTROL   Take 0.5 tablets (2.5 mg total) by mouth daily before breakfast.      levofloxacin 500 MG tablet   Commonly known as: LEVAQUIN   Take 1 tablet (500 mg total) by mouth every other day.      multivitamin Tabs tablet   Take 1 tablet by mouth daily.      NIFEdipine 90 MG 24 hr tablet   Commonly known as: PROCARDIA XL/ADALAT-CC   Take 1 tablet by mouth daily.      predniSONE 20 MG tablet   Commonly known as: DELTASONE   Take 1 tablet (20 mg total) by mouth daily with breakfast.      sevelamer 800 MG tablet   Commonly known as: RENVELA   Take 800 mg by mouth 3 (three) times daily with meals.         BRIEF HPI:  See H&P, Labs, Consult and Test reports for all details in brief, patient was admitted for hypoglycemic episodes. This is a 66 year old gentleman with history of end-stage renal disease and diabetes mellitus who is on long acting glipizide, just recently started Hemodialysis upon  routinely checking his blood sugars and found to be 49.He states he felt weak and nervous but he was never diaphoretic or confused. The patient's fingerstick blood sugars was 26 on arrival to the ER  CONSULTATIONS:   Renal  [facility]  PERTINENT RADIOLOGIC STUDIES: Dg Chest 1 View  10/12/2012  *RADIOLOGY REPORT*  Clinical Data: Evaluate for fluid overload.  Shortness of breath. End-stage renal disease.  CHEST - 1 VIEW  Comparison: 10/11/2012  Findings: Dialysis catheter unchanged in position at mid and low SVC.  Moderate cardiomegaly. No pleural effusion or pneumothorax. Similar mild pulmonary venous congestion.  Mild thickening right minor fissure.  Improved right base aeration with mild atelectasis remaining.  IMPRESSION: Cardiomegaly with similar mild pulmonary venous congestion.  No overt congestive failure.   Original Report Authenticated  By: Jeronimo Greaves, M.D.    Dg Chest 2 View  10/11/2012  *RADIOLOGY REPORT*  Clinical Data: Hypoglycemia; weakness.  Left arm and leg pain. Cough.  CHEST - 2 VIEW  Comparison: Chest radiograph performed 09/15/2012  Findings: The lungs are well-aerated.  Vascular congestion is noted.  Mild right basilar airspace opacity could reflect mild interstitial edema.  There is no evidence of pleural effusion or pneumothorax.  The heart is borderline enlarged.  A right-sided dual lumen catheter is noted ending about the distal SVC.  No acute osseous abnormalities are seen.  IMPRESSION: Vascular congestion and borderline cardiomegaly; mild right basilar airspace opacity could reflect mild interstitial edema.   Original Report Authenticated By: Tonia Ghent, M.D.    Dg Knee Complete 4  Views Left  10/13/2012  *RADIOLOGY REPORT*  Clinical Data: Knee pain for 3 weeks.  No known injury.  LEFT KNEE - COMPLETE 4+ VIEW  Comparison: None.  Findings: There is no fracture.  There is moderate narrowing of the medial joint space compartment with small marginal osteophytes. The lateral and patellar femoral joint space compartments are relatively well preserved.  There is a large joint effusion.  Multiple small calcifications project within the joint fluid above the patella.  There larger more well corticated bone fragments below the patella and along the medial anterior aspect of the knee.  There are numerous small soft tissue calcification superficially along the lateral aspect of the knee and proximal leg.  IMPRESSION: No fracture.  Large joint effusion.  Intra-articular calcifications most evident in the suprapatellar joint capsule. Consider synovial osteochondromatosis in the differential diagnosis.  Other soft tissue calcifications are noted anteromedially with numerous others noted along the superficial, lateral soft tissues, nonspecific.   Original Report Authenticated By: Amie Portland, M.D.      PERTINENT LAB RESULTS: CBC:  Basename 10/14/12 0605  WBC 10.6*  HGB 11.1*  HCT 34.6*  PLT 274   CMET CMP     Component Value Date/Time   NA 137 10/16/2012 0455   K 4.1 10/16/2012 0455   CL 98 10/16/2012 0455   CO2 25 10/16/2012 0455   GLUCOSE 128* 10/16/2012 0455   BUN 57* 10/16/2012 0455   CREATININE 6.85* 10/16/2012 0455   CALCIUM 9.9 10/16/2012 0455   CALCIUM 9.2 01/13/2011 2249   PROT 7.8 09/26/2012 0856   ALBUMIN 3.6 09/26/2012 0856   AST 26 09/26/2012 0856   ALT 28 09/26/2012 0856   ALKPHOS 63 09/26/2012 0856   BILITOT 0.2* 09/26/2012 0856   GFRNONAA 7* 10/16/2012 0455   GFRAA 9* 10/16/2012 0455    GFR Estimated Creatinine Clearance: 15.4 ml/min (by C-G formula based on Cr of 6.85). No results found for this basename: LIPASE:2,AMYLASE:2 in the last 72 hours No results found  for this basename: CKTOTAL:3,CKMB:3,CKMBINDEX:3,TROPONINI:3 in the last 72 hours No components found with this basename: POCBNP:3 No results found for this basename: DDIMER:2 in the last 72 hours No results found for this basename: HGBA1C:2 in the last 72 hours No results found for this basename: CHOL:2,HDL:2,LDLCALC:2,TRIG:2,CHOLHDL:2,LDLDIRECT:2 in the last 72 hours No results found for this basename: TSH,T4TOTAL,FREET3,T3FREE,THYROIDAB in the last 72 hours No results found for this basename: VITAMINB12:2,FOLATE:2,FERRITIN:2,TIBC:2,IRON:2,RETICCTPCT:2 in the last 72 hours Coags: No results found for this basename: PT:2,INR:2 in the last 72 hours Microbiology: Recent Results (from the past 240 hour(s))  CULTURE, BLOOD (ROUTINE X 2)     Status: Normal (Preliminary result)   Collection Time   10/11/12  9:50 PM  Component Value Range Status Comment   Specimen Description BLOOD RIGHT HAND   Final    Special Requests BOTTLES DRAWN AEROBIC ONLY 3CC   Final    Culture  Setup Time 10/12/2012 04:40   Final    Culture     Final    Value:        BLOOD CULTURE RECEIVED NO GROWTH TO DATE CULTURE WILL BE HELD FOR 5 DAYS BEFORE ISSUING A FINAL NEGATIVE REPORT   Report Status PENDING   Incomplete   CULTURE, BLOOD (ROUTINE X 2)     Status: Normal (Preliminary result)   Collection Time   10/11/12 10:23 PM      Component Value Range Status Comment   Specimen Description BLOOD RIGHT HAND   Final    Special Requests BOTTLES DRAWN AEROBIC ONLY 5CC   Final    Culture  Setup Time 10/12/2012 04:40   Final    Culture     Final    Value:        BLOOD CULTURE RECEIVED NO GROWTH TO DATE CULTURE WILL BE HELD FOR 5 DAYS BEFORE ISSUING A FINAL NEGATIVE REPORT   Report Status PENDING   Incomplete   MRSA PCR SCREENING     Status: Normal   Collection Time   10/12/12  6:28 AM      Component Value Range Status Comment   MRSA by PCR NEGATIVE  NEGATIVE Final   CULTURE, BLOOD (ROUTINE X 2)     Status: Normal  (Preliminary result)   Collection Time   10/13/12 11:16 AM      Component Value Range Status Comment   Specimen Description BLOOD RIGHT HAND   Final    Special Requests BOTTLES DRAWN AEROBIC ONLY 10CC   Final    Culture  Setup Time 10/13/2012 15:38   Final    Culture     Final    Value:        BLOOD CULTURE RECEIVED NO GROWTH TO DATE CULTURE WILL BE HELD FOR 5 DAYS BEFORE ISSUING A FINAL NEGATIVE REPORT   Report Status PENDING   Incomplete   CULTURE, BLOOD (ROUTINE X 2)     Status: Normal (Preliminary result)   Collection Time   10/13/12 11:22 AM      Component Value Range Status Comment   Specimen Description BLOOD RIGHT HAND   Final    Special Requests BOTTLES DRAWN AEROBIC ONLY 10CC   Final    Culture  Setup Time 10/13/2012 15:38   Final    Culture     Final    Value:        BLOOD CULTURE RECEIVED NO GROWTH TO DATE CULTURE WILL BE HELD FOR 5 DAYS BEFORE ISSUING A FINAL NEGATIVE REPORT   Report Status PENDING   Incomplete   GRAM STAIN     Status: Normal   Collection Time   10/13/12 12:22 PM      Component Value Range Status Comment   Specimen Description FLUID SYNOVIAL KNEE LEFT   Final    Special Requests SYRINGE @ 25CC   Final    Gram Stain     Final    Value: MODERATE WBC PRESENT,BOTH PMN AND MONONUCLEAR     NO ORGANISMS SEEN     SLIDE REVIEWED BY DR [facility] 10/13/12 1410     CALLED TO J WESSELINK,RN 10/13/12 1412 BY K SCHULTZ   Report Status 10/13/2012 FINAL   Final   BODY FLUID CULTURE  Status: Normal (Preliminary result)   Collection Time   10/13/12 12:22 PM      Component Value Range Status Comment   Specimen Description FLUID SYNOVIAL KNEE LEFT   Final    Special Requests SYRINGE @ 25CC   Final    Gram Stain     Final    Value: MODERATE WBC PRESENT,BOTH PMN AND MONONUCLEAR     NO ORGANISMS SEEN     Gram Stain Report Called to,Read Back By and Verified With: Gram Stain Report Called to,Read Back By and Verified With: Gara Kroner  RN ON 10/13/12 BY K SCHULTZ Performed at  [facility]   Culture NO GROWTH 3 DAYS   Final    Report Status PENDING   Incomplete      BRIEF HOSPITAL COURSE:      Hypoglycemia -2/2 to use of long acting Glipizide in the setting of ESRD-this resolved with supportive care, patient was very briefly hydrated with D5 Saline. During the hospital course-patient's sugar's slowly started to creep up, he was also on steroids for a acute gouty flare-so he was placed back on low dose Glipizide and SSI. CBG's have been stable with no further episodes of hypoglycemia during this admission. A1C is 5.5, therefore would not pursue aggressive control of his Diabetes.  Left Knee pain-Acute Gouty Arthritis -during this hospital course patient developed acute left knee pain, ortho was consulted, the knee was tapped-which showed crystals, patient was started on Prednisone with good response, he will be discharged on prednisone for a few more days on discharge  ESRD -this was managed by the renal team. During his course here, [facility] was consulted for for superficial mobilization of his left arm AV fistula,which was done 12/9. Patient has been cleared by [facility] for discharge was well.  Acute Bronchitis -on admission patient was complaining of some cough and fever as well, CXR was neg for PNA but he was thought to have acute bronchitis, patient was placed on Levaquin, given as needed nebulized bronchodilators. He is now afebrile and much better. Blood cultures are negative to date. He will be discharged on Levaquin for 3 more doses.  Anemia -this was stable and managed by the renal team   TODAY-DAY OF DISCHARGE:  Subjective:   Nicolas Mccann today has no headache,no chest abdominal pain,no new weakness tingling or numbness, feels much better wants to go home today.   Objective:   Blood pressure 120/73, pulse 92, temperature 98 F (36.7 C), temperature source Oral, resp. rate 22, height 6\' 3"  (1.905 m), weight 130.2 kg (287 lb 0.6 oz), SpO2  94.00%.  Intake/Output Summary (Last 24 hours) at 10/16/12 1448 Last data filed at 10/16/12 1407  Gross per 24 hour  Intake    740 ml  Output   1300 ml  Net   -560 ml    Exam Awake Alert, Oriented *3, No new F.N deficits, Normal affect .AT,PERRAL Supple Neck,No JVD, No cervical lymphadenopathy appriciated.  Symmetrical Chest wall movement, Good air movement bilaterally, CTAB RRR,No Gallops,Rubs or new Murmurs, No Parasternal Heave +ve B.Sounds, Abd Soft, Non tender, No organomegaly appriciated, No rebound -guarding or rigidity. No Cyanosis, Clubbing or edema, No new Rash or bruise  DISCHARGE CONDITION: Stable  DISPOSITION: HOME with HHPT  DISCHARGE INSTRUCTIONS:    Activity:  As tolerated with Full fall precautions use walker/cane & assistance as needed  Diet recommendation: Renal and Diabetic Diet  Follow-up Information    Follow up with EARLY, TODD, MD. In 4  weeks. (sent)    Contact information:   [address] [place] [address] [phone]       Follow up with Dialysis center. (please follow up with your Dialysis center at your regualr schedule)         Total Time spent on discharge equals 45 minutes.  SignedJeoffrey Massed 10/16/2012 2:48 PM

## 2012-10-16 NOTE — Progress Notes (Signed)
Patient ID: Nicolas Mccann, male   DOB: 1946-10-28, 66 y.o.   MRN: [id number]   [facility] Progress Note    Subjective:   Reports some acute left groin pain this morning (currently resolved) after a comfortable and uneventful night. Await superficial position of left arm arteriovenous fistula today.    Objective:   BP 120/73  Pulse 92  Temp 98 F (36.7 C) (Oral)  Resp 22  Ht 6\' 3"  (1.905 m)  Wt 130.2 kg (287 lb 0.6 oz)  BMI 35.88 kg/m2  SpO2 97%  Physical Exam: Gen: Comfortably resting in bed, watching television CVS: Pulse regular in rate and rhythm, heart sounds S1 and S2 normal Resp: Clear to auscultation bilaterally, no rales/rhonchi Abd: Soft, obese, nontender, no visible mass/hernia/rebound over left lower quadrant Ext: No lower extremity edema  Labs: BMET  Lab 10/16/12 0455 10/14/12 0605 10/13/12 0700 10/12/12 0938 10/11/12 1956  NA 137 137 139 139 139  K 4.1 5.1 4.3 4.5 3.9  CL 98 99 100 101 100  CO2 25 20 27 25 24   GLUCOSE 128* 228* 163* 83 83  BUN 57* 50* 26* 41* 39*  CREATININE 6.85* 7.04* 5.30* 7.91* 7.19*  ALB -- -- -- -- --  CALCIUM 9.9 10.2 9.8 10.2 10.3  PHOS -- -- -- -- --   CBC  Lab 10/14/12 0605 10/12/12 0938 10/11/12 1956  WBC 10.6* 6.0 6.0  NEUTROABS -- -- 4.0  HGB 11.1* 11.3* 11.2*  HCT 34.6* 35.8* 35.3*  MCV 102.1* 104.1* 105.4*  PLT 274 234 230    Medications:      . albuterol  2.5 mg Nebulization TID  . amitriptyline  25 mg Oral QHS  .  ceFAZolin (ANCEF) IV  2 g Intravenous On Call  . darbepoetin  60 mcg Intravenous Q7 days  . ferric gluconate (FERRLECIT/NULECIT) IV  62.5 mg Intravenous Weekly  . glipiZIDE  2.5 mg Oral QAC breakfast  . guaiFENesin  600 mg Oral BID  . heparin  5,000 Units Subcutaneous Q8H  . insulin aspart  0-9 Units Subcutaneous TID WC  . levofloxacin  500 mg Oral Q48H  . multivitamin  1 tablet Oral Daily  . NIFEdipine  90 mg Oral Daily  . predniSONE  20 mg Oral Q breakfast  . sevelamer  800 mg  Oral TID WC  . [DISCONTINUED] predniSONE  30 mg Oral Q breakfast  . [DISCONTINUED] predniSONE  40 mg Oral Q breakfast     Assessment/ Plan:   1. Hypoglycemic episode- suspected due to poor oral intake with ongoing sulfonylurea therapy-restarted glipizide at lower doses. 2. L knee pain/effusion- suspected secondary due to gout flare versus osteoarthritis-status post knee infusion arthrocentesis and currently on prednisone. 3. ESRD/recent start 4 wks ago at [facility] cont hd [name]- HD tolerated yesterday post wt. =130.2 kg below dry weight. New lower edw 4. Access- has TDC and LUA AVF. Await superficialization of this fistula today  5. HTN/volume- on procardia 90/d as at home. Below dry wt. 114/72 bp 6. Anemia of CKD- give darbe 60/wk in place of EPO, IV fe 50/wk hgb 11.1 7. Secondary HPTH- Renvela Phos 6.5 and Ca 10.2 with Alb 3.6. Off zemplar with ca Sl high. Fu op center with reck PTH per protocal As op.   Zetta Bills, MD 10/16/2012, 8:21 AM

## 2012-10-16 NOTE — H&P (View-Only) (Signed)
Patient ID: Nicolas Mccann, male   DOB: 11/04/1946, 66 y.o.   MRN: [id number] The patient remains medically stable. We have been requested to proceed with the superficial mobilization of his left arm AV fistula while an inpatient. This is been added to the schedule tomorrow. I discussed again the procedure in detail with the patient who understands. He should be ready for discharge at any time following the procedure including tomorrow afternoon since this would typically be an outpatient procedure. He understands this will hopefully provide better access to his fistula but may continue to have difficulty. He dialyzed successfully yesterday via his hemodialysis catheter 

## 2012-10-16 NOTE — Op Note (Signed)
OPERATIVE REPORT  DATE OF SURGERY: 10/16/2012  PATIENT: Nicolas Mccann, 66 y.o. male MRN: [id number]  DOB: 11/29/1945  PRE-OPERATIVE DIAGNOSIS: End-stage renal disease  POST-OPERATIVE DIAGNOSIS:  Same  PROCEDURE: Superficial mobilization of left upper arm AV fistula  SURGEON:  Gretta Began, M.D.  PHYSICIAN ASSISTANT: Collins  ANESTHESIA:  Gen.  EBL: Minimal ml  Total I/O In: 500 [I.V.:500] Out: 500 [Urine:500]  BLOOD ADMINISTERED: None  DRAINS: None  SPECIMEN: None  COUNTS CORRECT:  YES  PLAN OF CARE: PACU   PATIENT DISPOSITION:  PACU - hemodynamically stable  PROCEDURE DETAILS: The patient was taken to the operating room and placed supine position where the area of the left arm was prepped and draped 3 separate incisions were made over the upper arm. One was near the arteriovenous anastomosis at the antecubital space one in the mid upper arm and one near the shoulder. The fat was resected under the skin leaving a very small area between the skin and the vein. The fascia overlying the cephalic vein was opened in its entirety and was resected. There were several small side branches and these were ligated and divided. Hemostasis was obtained electrocautery. The wounds were irrigated with saline. The wounds were closed with 3-0 Vicryl and the subcuticular tissue. Benzoin Steri-Strips Curlex and Ace wrap were applied. The patient was returned to the recovery room in stable conditionin a sterile fashion. The patient had a fistula with good size maturation but it ran deep under the fat and fascia.   Gretta Began, M.D. 10/16/2012 3:04 PM

## 2012-10-16 NOTE — Anesthesia Preprocedure Evaluation (Addendum)
Anesthesia Evaluation  Patient identified by MRN, date of birth, ID band Patient awake    Reviewed: Allergy & Precautions, H&P , NPO status , Patient's Chart, lab work & pertinent test results  Airway Mallampati: II TM Distance: >3 FB Neck ROM: Full    Dental  (+) Teeth Intact and Dental Advisory Given   Pulmonary  breath sounds clear to auscultation        Cardiovascular hypertension, Pt. on medications Rhythm:Regular Rate:Normal     Neuro/Psych    GI/Hepatic   Endo/Other  diabetes, Type 2, Oral Hypoglycemic AgentsMorbid obesity  Renal/GU CRF and DialysisRenal disease     Musculoskeletal   Abdominal   Peds  Hematology   Anesthesia Other Findings   Reproductive/Obstetrics                          Anesthesia Physical Anesthesia Plan  ASA: III  Anesthesia Plan: General   Post-op Pain Management:    Induction: Intravenous  Airway Management Planned: LMA  Additional Equipment:   Intra-op Plan:   Post-operative Plan: Extubation in OR  Informed Consent: I have reviewed the patients History and Physical, chart, labs and discussed the procedure including the risks, benefits and alternatives for the proposed anesthesia with the patient or authorized representative who has indicated his/her understanding and acceptance.   Dental advisory given  Plan Discussed with: CRNA, Surgeon and Anesthesiologist  Anesthesia Plan Comments: (ESRD last HD 12/7 K-4.1 Type 2 DM glucose 128 htn Obesity  Plan GA with LMA  Kipp Brood, MD)       Anesthesia Quick Evaluation

## 2012-10-16 NOTE — Progress Notes (Addendum)
PATIENT DETAILS Name: Nicolas Mccann Age: 66 y.o. Sex: male Date of Birth: 01/08/1946 Admit Date: 10/11/2012 Admitting Physician Gery Pray, MD NGE:[id number], MD  Subjective: Awake and alert-left knee continues to improve. Had some mild left groin pain earlier this am-that has completely resolved  Assessment/Plan: Active Problems: Hypoglycemia -2/2 to use of long acting Glipizide in the setting of ESRD-resolved -CBG's back up-started low dose Glipizide 12/7 (not long acting), also on low dose SSI -repeat A1C-5.6-will allow some permissive hyperglycemia   HTN -controlled -c/w Procardia  ESRD -per Renal -[facility] is taking patient for superficial mobilization of his left arm AV fistula today, had spoken with Dr Arbie Cookey Jetta Lout had indicated that patient could be discharged following the procedure. Use as needed oxycodone for pain  Left Knee pain-Acute Gouty Arthritis -taper prednisone -had knee tap 12/6-+crystals-cultures neg so far  Acute Bronchitis -maintain on levaquin -afebrile now -blood cultures neg so far  Anemia -per renal -2/2 renal disease    Obesity -counseled regarding weight loss  Metabolic bone disease -per renal  Disposition: Remain inpatient-home later today  DVT Prophylaxis: Prophylactic  Heparin  Code Status: Full code  Procedures:  None  CONSULTS:  nephrology Ortho [facility]  PHYSICAL EXAM: Vital signs in last 24 hours: Filed Vitals:   10/16/12 1524 10/16/12 1530 10/16/12 1539 10/16/12 1545  BP: 128/66  128/67   Pulse:  93 96 96  Temp:      TempSrc:      Resp:  21 21 9   Height:      Weight:      SpO2:  97% 91% 97%    Weight change:  Body mass index is 35.88 kg/(m^2).   Gen Exam: Awake and alert with clear speech.  Neck: Supple, No JVD.   Chest: B/L Clear.   CVS: S1 S2 Regular, no murmurs.  Abdomen: soft, BS +, non tender, non distended. No tenderness in the left groin area. No hernias palpated or seen Extremities:  no edema, lower extremities warm to touch.Left knee not swollen or tender. Neurologic: Non Focal.   Skin: No Rash.   Wounds: N/A.    Intake/Output from previous day:  Intake/Output Summary (Last 24 hours) at 10/16/12 1642 Last data filed at 10/16/12 1500  Gross per 24 hour  Intake    940 ml  Output   1300 ml  Net   -360 ml     LAB RESULTS: CBC  Lab 10/14/12 0605 10/12/12 0938 10/11/12 1956  WBC 10.6* 6.0 6.0  HGB 11.1* 11.3* 11.2*  HCT 34.6* 35.8* 35.3*  PLT 274 234 230  MCV 102.1* 104.1* 105.4*  MCH 32.7 32.8 33.4  MCHC 32.1 31.6 31.7  RDW 15.6* 16.4* 16.5*  LYMPHSABS -- -- 1.1  MONOABS -- -- 0.8  EOSABS -- -- 0.1  BASOSABS -- -- 0.0  BANDABS -- -- --    Chemistries   Lab 10/16/12 0455 10/14/12 0605 10/13/12 0700 10/12/12 0938 10/11/12 1956  NA 137 137 139 139 139  K 4.1 5.1 4.3 4.5 3.9  CL 98 99 100 101 100  CO2 25 20 27 25 24   GLUCOSE 128* 228* 163* 83 83  BUN 57* 50* 26* 41* 39*  CREATININE 6.85* 7.04* 5.30* 7.91* 7.19*  CALCIUM 9.9 10.2 9.8 10.2 10.3  MG -- -- -- -- --    CBG:  Lab 10/16/12 0737 10/15/12 2152 10/15/12 1656 10/15/12 1150 10/15/12 0758  GLUCAP 97 201* 208* 119* 85    GFR Estimated Creatinine Clearance: 15.4 ml/min (  by C-G formula based on Cr of 6.85).  Coagulation profile No results found for this basename: INR:5,PROTIME:5 in the last 168 hours  Cardiac Enzymes No results found for this basename: CK:3,CKMB:3,TROPONINI:3,MYOGLOBIN:3 in the last 168 hours  No components found with this basename: POCBNP:3 No results found for this basename: DDIMER:2 in the last 72 hours No results found for this basename: HGBA1C:2 in the last 72 hours No results found for this basename: CHOL:2,HDL:2,LDLCALC:2,TRIG:2,CHOLHDL:2,LDLDIRECT:2 in the last 72 hours No results found for this basename: TSH,T4TOTAL,FREET3,T3FREE,THYROIDAB in the last 72 hours No results found for this basename: VITAMINB12:2,FOLATE:2,FERRITIN:2,TIBC:2,IRON:2,RETICCTPCT:2 in  the last 72 hours No results found for this basename: LIPASE:2,AMYLASE:2 in the last 72 hours  Urine Studies No results found for this basename: UACOL:2,UAPR:2,USPG:2,UPH:2,UTP:2,UGL:2,UKET:2,UBIL:2,UHGB:2,UNIT:2,UROB:2,ULEU:2,UEPI:2,UWBC:2,URBC:2,UBAC:2,CAST:2,CRYS:2,UCOM:2,BILUA:2 in the last 72 hours  MICROBIOLOGY: Recent Results (from the past 240 hour(s))  CULTURE, BLOOD (ROUTINE X 2)     Status: Normal (Preliminary result)   Collection Time   10/11/12  9:50 PM      Component Value Range Status Comment   Specimen Description BLOOD RIGHT HAND   Final    Special Requests BOTTLES DRAWN AEROBIC ONLY 3CC   Final    Culture  Setup Time 10/12/2012 04:40   Final    Culture     Final    Value:        BLOOD CULTURE RECEIVED NO GROWTH TO DATE CULTURE WILL BE HELD FOR 5 DAYS BEFORE ISSUING A FINAL NEGATIVE REPORT   Report Status PENDING   Incomplete   CULTURE, BLOOD (ROUTINE X 2)     Status: Normal (Preliminary result)   Collection Time   10/11/12 10:23 PM      Component Value Range Status Comment   Specimen Description BLOOD RIGHT HAND   Final    Special Requests BOTTLES DRAWN AEROBIC ONLY 5CC   Final    Culture  Setup Time 10/12/2012 04:40   Final    Culture     Final    Value:        BLOOD CULTURE RECEIVED NO GROWTH TO DATE CULTURE WILL BE HELD FOR 5 DAYS BEFORE ISSUING A FINAL NEGATIVE REPORT   Report Status PENDING   Incomplete   MRSA PCR SCREENING     Status: Normal   Collection Time   10/12/12  6:28 AM      Component Value Range Status Comment   MRSA by PCR NEGATIVE  NEGATIVE Final   CULTURE, BLOOD (ROUTINE X 2)     Status: Normal (Preliminary result)   Collection Time   10/13/12 11:16 AM      Component Value Range Status Comment   Specimen Description BLOOD RIGHT HAND   Final    Special Requests BOTTLES DRAWN AEROBIC ONLY 10CC   Final    Culture  Setup Time 10/13/2012 15:38   Final    Culture     Final    Value:        BLOOD CULTURE RECEIVED NO GROWTH TO DATE CULTURE WILL BE  HELD FOR 5 DAYS BEFORE ISSUING A FINAL NEGATIVE REPORT   Report Status PENDING   Incomplete   CULTURE, BLOOD (ROUTINE X 2)     Status: Normal (Preliminary result)   Collection Time   10/13/12 11:22 AM      Component Value Range Status Comment   Specimen Description BLOOD RIGHT HAND   Final    Special Requests BOTTLES DRAWN AEROBIC ONLY 10CC   Final    Culture  Setup Time 10/13/2012  15:38   Final    Culture     Final    Value:        BLOOD CULTURE RECEIVED NO GROWTH TO DATE CULTURE WILL BE HELD FOR 5 DAYS BEFORE ISSUING A FINAL NEGATIVE REPORT   Report Status PENDING   Incomplete   GRAM STAIN     Status: Normal   Collection Time   10/13/12 12:22 PM      Component Value Range Status Comment   Specimen Description FLUID SYNOVIAL KNEE LEFT   Final    Special Requests SYRINGE @ 25CC   Final    Gram Stain     Final    Value: MODERATE WBC PRESENT,BOTH PMN AND MONONUCLEAR     NO ORGANISMS SEEN     SLIDE REVIEWED BY DR [facility] 10/13/12 1410     CALLED TO J WESSELINK,RN 10/13/12 1412 BY K SCHULTZ   Report Status 10/13/2012 FINAL   Final   BODY FLUID CULTURE     Status: Normal (Preliminary result)   Collection Time   10/13/12 12:22 PM      Component Value Range Status Comment   Specimen Description FLUID SYNOVIAL KNEE LEFT   Final    Special Requests SYRINGE @ 25CC   Final    Gram Stain     Final    Value: MODERATE WBC PRESENT,BOTH PMN AND MONONUCLEAR     NO ORGANISMS SEEN     Gram Stain Report Called to,Read Back By and Verified With: Gram Stain Report Called to,Read Back By and Verified With: Gara Kroner  RN ON 10/13/12 BY K SCHULTZ Performed at [facility]   Culture NO GROWTH 3 DAYS   Final    Report Status PENDING   Incomplete     RADIOLOGY STUDIES/RESULTS: Dg Chest 1 View  10/12/2012  *RADIOLOGY REPORT*  Clinical Data: Evaluate for fluid overload.  Shortness of breath. End-stage renal disease.  CHEST - 1 VIEW  Comparison: 10/11/2012  Findings: Dialysis catheter unchanged in position  at mid and low SVC.  Moderate cardiomegaly. No pleural effusion or pneumothorax. Similar mild pulmonary venous congestion.  Mild thickening right minor fissure.  Improved right base aeration with mild atelectasis remaining.  IMPRESSION: Cardiomegaly with similar mild pulmonary venous congestion.  No overt congestive failure.   Original Report Authenticated By: Jeronimo Greaves, M.D.    Dg Chest 2 View  10/11/2012  *RADIOLOGY REPORT*  Clinical Data: Hypoglycemia; weakness.  Left arm and leg pain. Cough.  CHEST - 2 VIEW  Comparison: Chest radiograph performed 09/15/2012  Findings: The lungs are well-aerated.  Vascular congestion is noted.  Mild right basilar airspace opacity could reflect mild interstitial edema.  There is no evidence of pleural effusion or pneumothorax.  The heart is borderline enlarged.  A right-sided dual lumen catheter is noted ending about the distal SVC.  No acute osseous abnormalities are seen.  IMPRESSION: Vascular congestion and borderline cardiomegaly; mild right basilar airspace opacity could reflect mild interstitial edema.   Original Report Authenticated By: Tonia Ghent, M.D.    Dg Chest 2 View  09/14/2012  *RADIOLOGY REPORT*  Clinical Data: Follow-up edema.  Diabetes and hypertension. Dialysis patient  CHEST - 2 VIEW  Comparison: 09/13/2012  Findings: Cardiomegaly is identified and appears stable. There is persistent pulmonary vascular congestion, small bilateral pleural effusions and fissural fluid identified compatible with mild pulmonary edema.  This is unchanged in comparison with the prior exam.  No new focal infiltrates are identified.  Bony structures demonstrate  some degenerative change of the lower thoracic spine.  IMPRESSION: Unchanged pulmonary vascular congestion and mild pulmonary edema pattern.   Original Report Authenticated By: Rhodia Albright, M.D.    Dg Chest Port 1 View  09/15/2012  *RADIOLOGY REPORT*  Clinical Data: Central line placement  PORTABLE CHEST - 1  VIEW  Comparison: Chest radiograph 09/14/2012  Findings: There is interval placement of large bore right central venous line.  Split catheter tips in the distal SVC.  No pneumothorax.  Stable enlarged heart silhouette.  There is central venous congestion and mild pulmonary edema not changed from prior.  IMPRESSION: 1.  Interval placement right central venous line without complication. 2.  Cardiomegaly and mild pulmonary edema.   Original Report Authenticated By: [name], M.D.    Dg Chest Port 1 View  09/13/2012  *RADIOLOGY REPORT*  Clinical Data: Pulmonary edema follow-up  PORTABLE CHEST - 1 VIEW  Comparison: 09/12/2012  Findings: Prominent cardiomediastinal contours.  Central vascular congestion.  Central peribronchial thickening, interstitial, and left greater right perihilar/infrahilar airspace opacities.  No definite pleural effusion.  No pneumothorax.  No interval osseous change.  IMPRESSION: Prominent cardiomediastinal contours with pulmonary edema pattern, similar to prior.   Original Report Authenticated By: Jearld Lesch, M.D.    Dg Chest Portable 1 View  09/12/2012  *RADIOLOGY REPORT*  Clinical Data: Shortness of breath and chest pain after exertion when catches breath, 1 month in duration, history hypertension, diabetes  PORTABLE CHEST - 1 VIEW  Comparison: Portable exam 1354 hours compared to 09/06/2005  Findings: Enlargement of cardiac silhouette with pulmonary vascular congestion. Mild perihilar infiltrates question edema, infection not excluded. No gross pleural effusion or pneumothorax. Bones unremarkable.  IMPRESSION: Enlargement of cardiac silhouette with pulmonary vascular congestion and mild perihilar infiltrates, slightly greater on left, favor edema over infection.   Original Report Authenticated By: Ulyses Southward, M.D.     MEDICATIONS: Scheduled Meds:    . albuterol  2.5 mg Nebulization TID  . amitriptyline  25 mg Oral QHS  . [COMPLETED]  ceFAZolin (ANCEF) IV  2 g  Intravenous On Call  . darbepoetin  60 mcg Intravenous Q7 days  . ferric gluconate (FERRLECIT/NULECIT) IV  62.5 mg Intravenous Weekly  . glipiZIDE  2.5 mg Oral QAC breakfast  . guaiFENesin  600 mg Oral BID  . heparin  5,000 Units Subcutaneous Q8H  . HYDROmorphone      . insulin aspart  0-9 Units Subcutaneous TID WC  . levofloxacin  500 mg Oral Q48H  . multivitamin  1 tablet Oral Daily  . NIFEdipine  90 mg Oral Daily  . predniSONE  20 mg Oral Q breakfast  . sevelamer  800 mg Oral TID WC  . [DISCONTINUED] predniSONE  30 mg Oral Q breakfast   Continuous Infusions:    . sodium chloride     PRN Meds:.acetaminophen, acetaminophen, alum & mag hydroxide-simeth, docusate sodium, menthol-cetylpyridinium, ondansetron (ZOFRAN) IV, ondansetron, oxyCODONE, [DISCONTINUED] 0.9 % irrigation (POUR BTL), [DISCONTINUED] acetaminophen, [DISCONTINUED] heparin 6000 unit irrigation, [DISCONTINUED]  HYDROmorphone (DILAUDID) injection, [DISCONTINUED] ondansetron (ZOFRAN) IV  Antibiotics: Anti-infectives     Start     Dose/Rate Route Frequency Ordered Stop   10/16/12 0600   ceFAZolin (ANCEF) IVPB 2 g/50 mL premix     Comments: Send with pt to OR      2 g 100 mL/hr over 30 Minutes Intravenous On call 10/15/12 0922 10/16/12 1300   10/16/12 0000   levofloxacin (LEVAQUIN) 500 MG tablet  Status:  Discontinued  500 mg Oral Every 48 hours 10/16/12 1447 10/16/12    10/16/12 0000   levofloxacin (LEVAQUIN) 500 MG tablet        500 mg Oral Every 48 hours 10/16/12 1636     10/12/12 1300   levofloxacin (LEVAQUIN) tablet 500 mg        500 mg Oral Every 48 hours 10/12/12 1225     10/12/12 0300   cefTRIAXone (ROCEPHIN) 1 g in dextrose 5 % 50 mL IVPB        1 g 100 mL/hr over 30 Minutes Intravenous  Once 10/12/12 0224 10/12/12 Josph Macho, MD  [facility] Pager:[phone]  If 7PM-7AM, please contact night-coverage [email or web address] Password [email or web address] 10/16/2012, 4:42  PM   LOS: 5 days

## 2012-10-16 NOTE — Anesthesia Procedure Notes (Signed)
Procedure Name: LMA Insertion Date/Time: 10/16/2012 12:58 PM Performed by: Rogelia Boga Pre-anesthesia Checklist: Patient identified, Emergency Drugs available, Suction available, Patient being monitored and Timeout performed Patient Re-evaluated:Patient Re-evaluated prior to inductionOxygen Delivery Method: Circle system utilized Preoxygenation: Pre-oxygenation with 100% oxygen Intubation Type: IV induction LMA: LMA inserted LMA Size: 5.0 Number of attempts: 1 Placement Confirmation: positive ETCO2 and breath sounds checked- equal and bilateral Tube secured with: Tape Dental Injury: Teeth and Oropharynx as per pre-operative assessment  Comments: Bujak Test <20 CM H2O

## 2012-10-16 NOTE — Progress Notes (Signed)
PT Cancellation Note  Patient Details Name: Nicolas Mccann MRN: [id number] DOB: 04-18-1946   Cancelled Treatment:    Reason Eval/Treat Not Completed: Patient at procedure or test/unavailable   Dravyn Severs 10/16/2012, 1:21 PM  [facility] PT [phone]

## 2012-10-16 NOTE — Preoperative (Signed)
Beta Blockers   Reason not to administer Beta Blockers:Not Applicable 

## 2012-10-17 ENCOUNTER — Telehealth: Payer: Self-pay | Admitting: Vascular Surgery

## 2012-10-17 LAB — BODY FLUID CULTURE: Culture: NO GROWTH

## 2012-10-17 NOTE — Telephone Encounter (Signed)
Message copied by Rosalyn Charters on Tue Oct 17, 2012  9:44 AM ------      Message from: Melene Plan      Created: Mon Oct 16, 2012  4:59 PM                   ----- Message -----         From: Lars Mage, PA         Sent: 10/16/2012   2:36 PM           To: Melene Plan, RN            Superficial mobilization  of left upper arm AV fistula.  F/U in 4 weeks with DR. Early.

## 2012-10-17 NOTE — Anesthesia Postprocedure Evaluation (Signed)
  Anesthesia Post-op Note  Patient: Nicolas Mccann  Procedure(s) Performed: Procedure(s) (LRB) with comments: FISTULA SUPERFICIALIZATION (Left) - Ultrasound Guided  Patient Location: PACU  Anesthesia Type:General  Level of Consciousness: awake, alert  and oriented  Airway and Oxygen Therapy: Patient Spontanous Breathing and Patient connected to nasal cannula oxygen  Post-op Pain: mild  Post-op Assessment: Post-op Vital signs reviewed  Post-op Vital Signs: stable  Complications: No apparent anesthesia complications

## 2012-10-18 ENCOUNTER — Ambulatory Visit: Payer: Medicare Other | Admitting: Vascular Surgery

## 2012-10-18 LAB — CULTURE, BLOOD (ROUTINE X 2): Culture: NO GROWTH

## 2012-10-19 LAB — CULTURE, BLOOD (ROUTINE X 2)
Culture: NO GROWTH
Culture: NO GROWTH

## 2012-11-13 ENCOUNTER — Encounter: Payer: Self-pay | Admitting: Vascular Surgery

## 2012-11-14 ENCOUNTER — Ambulatory Visit: Payer: Medicare Other | Admitting: Vascular Surgery

## 2012-11-27 ENCOUNTER — Encounter: Payer: Self-pay | Admitting: Vascular Surgery

## 2012-11-28 ENCOUNTER — Encounter: Payer: Self-pay | Admitting: Vascular Surgery

## 2012-11-28 ENCOUNTER — Ambulatory Visit (INDEPENDENT_AMBULATORY_CARE_PROVIDER_SITE_OTHER): Payer: Medicare Other | Admitting: Vascular Surgery

## 2012-11-28 VITALS — BP 142/91 | HR 106 | Resp 20 | Ht 75.0 in | Wt 293.0 lb

## 2012-11-28 DIAGNOSIS — N186 End stage renal disease: Secondary | ICD-10-CM

## 2012-11-28 NOTE — Progress Notes (Signed)
The patient is here today for followup of his superficial mobilization of a left upper arm AV fistula by myself on 10/16/2012. His incisions are all healed quite nicely. He has excellent maturation of his cephalic vein and a superficial location. I feel that it is acceptable for him to use his fistula at any time. He is being dialyzed via a right IJ catheter. I explained that we would recommend removal of this after several successful dialysis treatments with his left upper arm AV fistula.

## 2013-01-03 ENCOUNTER — Emergency Department (INDEPENDENT_AMBULATORY_CARE_PROVIDER_SITE_OTHER): Payer: Medicare Other

## 2013-01-03 ENCOUNTER — Encounter (HOSPITAL_COMMUNITY): Payer: Self-pay

## 2013-01-03 ENCOUNTER — Other Ambulatory Visit: Payer: Self-pay | Admitting: *Deleted

## 2013-01-03 ENCOUNTER — Emergency Department (INDEPENDENT_AMBULATORY_CARE_PROVIDER_SITE_OTHER)
Admission: EM | Admit: 2013-01-03 | Discharge: 2013-01-03 | Disposition: A | Discharge status: Home / Self Care | Payer: Medicare Other | Source: Home / Self Care | Attending: Emergency Medicine | Admitting: Emergency Medicine

## 2013-01-03 DIAGNOSIS — M549 Dorsalgia, unspecified: Secondary | ICD-10-CM

## 2013-01-03 DIAGNOSIS — Y92009 Unspecified place in unspecified non-institutional (private) residence as the place of occurrence of the external cause: Secondary | ICD-10-CM

## 2013-01-03 DIAGNOSIS — W19XXXA Unspecified fall, initial encounter: Secondary | ICD-10-CM

## 2013-01-03 MED ORDER — OXYCODONE-ACETAMINOPHEN 5-325 MG PO TABS: 1.0000 | ORAL_TABLET | ORAL | Status: DC | PRN

## 2013-01-03 MED ORDER — OXYCODONE-ACETAMINOPHEN 5-325 MG PO TABS: 1.0000 | ORAL_TABLET | Freq: Two times a day (BID) | ORAL | Status: DC

## 2013-01-03 NOTE — ED Provider Notes (Signed)
History     CSN: [id number]  Arrival date & time 01/03/13  1531   First MD Initiated Contact with Patient 01/03/13 1541      Chief Complaint  Patient presents with  . Fall    (Consider location/radiation/quality/duration/timing/severity/associated sxs/prior treatment) HPI Comments: Patient presents urgent care describing that he fell earlier today after a yeast slip from a step stool and fell backwards landing on his buttocks on a hardwood floor earlier today. Has been sore on his lower back and his tailbone.. denies any abdominal pain, nausea vomiting.  Patient is a 67 y.o. male presenting with fall. The history is provided by the patient.  Fall The accident occurred 3 to 5 hours ago. The fall occurred from a stool. He fell from a height of 1 to 2 ft. He landed on a hard floor. There was no blood loss. Pain location: Lower back and tailbone. The pain is at a severity of 8/10. The pain is moderate. He was ambulatory at the scene. There was no entrapment after the fall. There was no drug use involved in the accident. There was no alcohol use involved in the accident. Pertinent negatives include no fever, no numbness, no abdominal pain, no bowel incontinence, no nausea and no loss of consciousness. The symptoms are aggravated by activity. The treatment provided no relief.    Past Medical History  Diagnosis Date  . Diabetes mellitus without complication   . ESRD on hemodialysis     [name], [name], ESRD due to DM/HTN, started dialysis in Sep 12, 2012  . Obesity   . Gout     Past Surgical History  Procedure Laterality Date  . Insertion of dialysis catheter  09/15/2012    Procedure: INSERTION OF DIALYSIS CATHETER;  Surgeon: Chuck Hint, MD;  Location: [facility] OR;  Service: Vascular;  Laterality: N/A;  . Menisectomy      L knee    Family History  Problem Relation Age of Onset  . Diabetes Mellitus II    . Hypertension    . Hypertension Mother   . Diabetes Mother   .  Hypertension Father     History  Substance Use Topics  . Smoking status: Never Smoker   . Smokeless tobacco: Former Neurosurgeon    Quit date: 11/28/1962  . Alcohol Use: No      Review of Systems  Constitutional: Positive for activity change. Negative for fever, chills, diaphoresis, appetite change and fatigue.  Gastrointestinal: Negative for nausea, abdominal pain and bowel incontinence.  Musculoskeletal: Positive for back pain. Negative for myalgias, joint swelling, arthralgias and gait problem.  Skin: Negative for pallor, rash and wound.  Neurological: Negative for loss of consciousness, weakness and numbness.    Allergies  Review of patient's allergies indicates no known allergies.  Home Medications   Current Outpatient Rx  Name  Route  Sig  Dispense  Refill  . amitriptyline (ELAVIL) 25 MG tablet   Oral   Take 25 mg by mouth at bedtime.         . docusate sodium (COLACE) 100 MG capsule   Oral   Take 100 mg by mouth daily as needed. For hard stool         . glipiZIDE (GLUCOTROL) 2.5 mg TABS   Oral   Take 0.5 tablets (2.5 mg total) by mouth daily before breakfast.   30 tablet   0   . levofloxacin (LEVAQUIN) 500 MG tablet   Oral   Take 1 tablet (500 mg  total) by mouth every other day.   3 tablet   0   . lisinopril (PRINIVIL,ZESTRIL) 10 MG tablet   Oral   Take 10 mg by mouth daily.         Marland Kitchen losartan (COZAAR) 50 MG tablet   Oral   Take 50 mg by mouth daily.         . multivitamin (RENA-VIT) TABS tablet   Oral   Take 1 tablet by mouth daily.   30 tablet   0   . NIFEdipine (PROCARDIA XL/ADALAT-CC) 90 MG 24 hr tablet   Oral   Take 1 tablet by mouth daily.         Marland Kitchen oxyCODONE (OXY IR/ROXICODONE) 5 MG immediate release tablet   Oral   Take 1 tablet (5 mg total) by mouth every 6 (six) hours as needed.   15 tablet   0   . oxyCODONE-acetaminophen (ROXICET) 5-325 MG per tablet   Oral   Take 1 tablet by mouth 2 (two) times daily.   10 tablet   0     . predniSONE (DELTASONE) 20 MG tablet   Oral   Take 1 tablet (20 mg total) by mouth daily with breakfast.   5 tablet   0   . sevelamer (RENVELA) 800 MG tablet   Oral   Take 800 mg by mouth 3 (three) times daily with meals.           BP 113/71  Pulse 84  Temp(Src) 98.2 F (36.8 C) (Oral)  SpO2 99%  Physical Exam  Nursing note and vitals reviewed. Constitutional: He appears well-developed. No distress.  Musculoskeletal: He exhibits tenderness.       Back:  Neurological: He is alert.  Skin: No rash noted. No erythema.    ED Course  Procedures (including critical care time)  Labs Reviewed - No data to display Dg Lumbar Spine Complete  01/03/2013  *RADIOLOGY REPORT*  Clinical Data: Fall, low back pain.  LUMBAR SPINE - COMPLETE 4+ VIEW  Comparison: None.  Findings: Sclerotic appearance of the bones diffusely.  This may be related to patient's end-stage renal disease and renal osteodystrophy.  No fracture or malalignment.  Diffuse degenerative disc disease throughout the lumbar spine with degenerative spurring.  SI joints are symmetric.  IMPRESSION: No acute bony abnormality.   Original Report Authenticated By: Charlett Nose, M.D.    Dg Sacrum/coccyx  01/03/2013  *RADIOLOGY REPORT*  Clinical Data: Fall.  Low back pain.  SACRUM AND COCCYX - 2+ VIEW  Comparison: Lumbar spine series performed today.  Findings: SI joints are symmetric and unremarkable.  No visible sacral or coccygeal fracture.  Hip joints are unremarkable.  IMPRESSION: No evidence of sacral or coccygeal fracture.   Original Report Authenticated By: Charlett Nose, M.D.      1. Back pain   2. Fall at home, initial encounter       MDM  Contusion sacral lumbar region. Unremarkable x-rays. Pain management is to next 48 hours.Specific instructions not to sue percocet for more than 24-48 hours and use one tablet every 12 hours .        Jimmie Molly, MD 01/03/13 2032

## 2013-01-03 NOTE — ED Notes (Signed)
States he fell from step stool onto home hardwood floor earlier today, and landed on sacral and coxyx area. Denies other injury and denies LOC

## 2013-01-08 ENCOUNTER — Encounter (HOSPITAL_COMMUNITY): Payer: Self-pay

## 2013-01-08 ENCOUNTER — Encounter (HOSPITAL_COMMUNITY)
Admission: RE | Admit: 2013-01-08 | Discharge: 2013-01-08 | Disposition: A | Discharge status: Home / Self Care | Payer: Medicare Other | Source: Ambulatory Visit | Attending: Vascular Surgery | Admitting: Vascular Surgery

## 2013-01-08 DIAGNOSIS — Z452 Encounter for adjustment and management of vascular access device: Secondary | ICD-10-CM | POA: Insufficient documentation

## 2013-01-08 DIAGNOSIS — N186 End stage renal disease: Secondary | ICD-10-CM | POA: Insufficient documentation

## 2013-01-08 DIAGNOSIS — I120 Hypertensive chronic kidney disease with stage 5 chronic kidney disease or end stage renal disease: Secondary | ICD-10-CM | POA: Insufficient documentation

## 2013-01-08 NOTE — H&P (Signed)
[facility] SHORT STAY H&P  CC: ESRD   HPI: Nicolas Mccann is a 67 y.o. male who has been on HD through  functioning Hemodialysis access in the left upper extremity. They are here for HD catheter removal. Pt. denies signs of steal syndrome.  Past Medical History  Diagnosis Date  . Diabetes mellitus without complication   . ESRD on hemodialysis     [name], [name], ESRD due to DM/HTN, started dialysis in Sep 12, 2012  . Obesity   . Gout     FH:  Non-Contributory  Social HX History  Substance Use Topics  . Smoking status: Never Smoker   . Smokeless tobacco: Former Neurosurgeon    Quit date: 11/28/1962  . Alcohol Use: No    Allergies No Known Allergies  Medications Current Outpatient Prescriptions  Medication Sig Dispense Refill  . amitriptyline (ELAVIL) 25 MG tablet Take 25 mg by mouth at bedtime.      . docusate sodium (COLACE) 100 MG capsule Take 100 mg by mouth daily as needed. For hard stool      . glipiZIDE (GLUCOTROL) 2.5 mg TABS Take 0.5 tablets (2.5 mg total) by mouth daily before breakfast.  30 tablet  0  . levofloxacin (LEVAQUIN) 500 MG tablet Take 1 tablet (500 mg total) by mouth every other day.  3 tablet  0  . lisinopril (PRINIVIL,ZESTRIL) 10 MG tablet Take 10 mg by mouth daily.      Marland Kitchen losartan (COZAAR) 50 MG tablet Take 50 mg by mouth daily.      . multivitamin (RENA-VIT) TABS tablet Take 1 tablet by mouth daily.  30 tablet  0  . NIFEdipine (PROCARDIA XL/ADALAT-CC) 90 MG 24 hr tablet Take 1 tablet by mouth daily.      Marland Kitchen oxyCODONE (OXY IR/ROXICODONE) 5 MG immediate release tablet Take 1 tablet (5 mg total) by mouth every 6 (six) hours as needed.  15 tablet  0  . oxyCODONE-acetaminophen (ROXICET) 5-325 MG per tablet Take 1 tablet by mouth 2 (two) times daily.  10 tablet  0  . predniSONE (DELTASONE) 20 MG tablet Take 1 tablet (20 mg total) by mouth daily with breakfast.  5 tablet  0  . sevelamer (RENVELA) 800 MG tablet Take 800 mg by mouth 3 (three) times  daily with meals.       No current facility-administered medications for this encounter.    Labs COAG No results found for this basename: INR, PROTIME   No results found for this basename: PTT    PHYSICAL EXAM  Filed Vitals:   01/08/13 1229  BP: 144/63  Pulse: 83  Temp: 98.2 F (36.8 C)  Resp: 18    General:  WDWN in NAD HENT: WNL Eyes: Pupils equal Pulmonary: normal non-labored breathing  Cardiac: RRR, Skin: normal, no cyanosis, jaundice, pallor or bruising Vascular Exam/Pulses: 2+ radial  pulses in LEFT upper extremity. Extremities without ischemic changes, no Gangrene , no cellulitis; no open wounds;   There is a  good bruit in the AVF. Hand grip is 5/5 and sensation in digits is intact;   Impression: This is a 67 y.o. male who has a functioning HD access.  Plan: Removal of Right IJ HD catheter ROCZNIAK,REGINA J 01/08/2013   1:00 PM

## 2013-01-08 NOTE — Progress Notes (Signed)
[facility] Catheter Removal Procedure Note  Diagnosis: ESRD with Functioning AVF/AVGG  Plan:  Remove right diatek catheter  Consent signed:  yes Time out completed:  yes Coumadin:  no PT/INR (if applicable):   Other labs:   Procedure: 1.  Sterile prepping and draping over catheter area 23.  right catheter removed in its entirety with cuff in tact. 4.  Complications: none  5. Tip of catheter sent for culture:  no   Patient tolerated procedure well:  yes Pressure held, no bleeding noted, dressing applied Instructions given to the pt regarding wound care and bleeding.  Other:  ROCZNIAK,REGINA J 01/08/2013 1:01 PM

## 2013-01-10 ENCOUNTER — Other Ambulatory Visit: Payer: Self-pay | Admitting: *Deleted

## 2013-01-10 DIAGNOSIS — T82598A Other mechanical complication of other cardiac and vascular devices and implants, initial encounter: Secondary | ICD-10-CM

## 2013-01-23 ENCOUNTER — Encounter: Payer: Self-pay | Admitting: Vascular Surgery

## 2013-01-24 ENCOUNTER — Ambulatory Visit: Payer: Medicare Other | Admitting: Vascular Surgery

## 2013-02-15 ENCOUNTER — Encounter: Payer: Self-pay | Admitting: Vascular Surgery

## 2013-02-16 ENCOUNTER — Encounter: Payer: Self-pay | Admitting: Vascular Surgery

## 2013-02-16 ENCOUNTER — Encounter (INDEPENDENT_AMBULATORY_CARE_PROVIDER_SITE_OTHER): Payer: Medicare Other | Admitting: Vascular Surgery

## 2013-02-16 ENCOUNTER — Ambulatory Visit (INDEPENDENT_AMBULATORY_CARE_PROVIDER_SITE_OTHER): Payer: Medicare Other | Admitting: Vascular Surgery

## 2013-02-16 VITALS — BP 121/75 | HR 95 | Ht 75.0 in | Wt 284.0 lb

## 2013-02-16 DIAGNOSIS — N186 End stage renal disease: Secondary | ICD-10-CM

## 2013-02-16 DIAGNOSIS — Z4931 Encounter for adequacy testing for hemodialysis: Secondary | ICD-10-CM

## 2013-02-16 DIAGNOSIS — T82598A Other mechanical complication of other cardiac and vascular devices and implants, initial encounter: Secondary | ICD-10-CM

## 2013-02-16 NOTE — Progress Notes (Signed)
[facility] [place]  Established Dialysis Access  History of Present Illness  Nicolas Mccann is a 67 y.o. (12/02/1945) male who presents for re-evaluation of left BC AVF.  The patient most recently had a superficialization of L BC AVF (10/16/12).  Patient has sent by his nephrologist with difficulty cannulating and poor flow rates.  The patient notes some mild numbness in his left hand.  Past Medical History  Diagnosis Date  . Diabetes mellitus without complication   . ESRD on hemodialysis     [facility], [name], ESRD due to DM/HTN, started dialysis in Sep 12, 2012  . Obesity   . Gout     Past Surgical History  Procedure Laterality Date  . Insertion of dialysis catheter  09/15/2012    Procedure: INSERTION OF DIALYSIS CATHETER;  Surgeon: Christopher S Dickson, MD;  Location: [facility] OR;  Service: Vascular;  Laterality: N/A;  . Menisectomy      L knee  . Av fistula placement Left     History   Social History  . Marital Status: Married    Spouse Name: N/A    Number of Children: N/A  . Years of Education: N/A   Occupational History  . Not on file.   Social History Main Topics  . Smoking status: Never Smoker   . Smokeless tobacco: Former User    Quit date: 11/28/1962  . Alcohol Use: No  . Drug Use: No  . Sexually Active: Not on file   Other Topics Concern  . Not on file   Social History Narrative  . No narrative on file    Family History  Problem Relation Age of Onset  . Diabetes Mellitus II    . Hypertension    . Hypertension Mother   . Diabetes Mother   . Hypertension Father     Current Outpatient Prescriptions on File Prior to Visit  Medication Sig Dispense Refill  . losartan (COZAAR) 50 MG tablet Take 50 mg by mouth daily.      . multivitamin (RENA-VIT) TABS tablet Take 1 tablet by mouth daily.  30 tablet  0  . sevelamer (RENVELA) 800 MG tablet Take 800 mg by mouth 3 (three) times daily with meals.      . amitriptyline (ELAVIL) 25 MG tablet Take  25 mg by mouth at bedtime.      . docusate sodium (COLACE) 100 MG capsule Take 100 mg by mouth daily as needed. For hard stool      . glipiZIDE (GLUCOTROL) 2.5 mg TABS Take 0.5 tablets (2.5 mg total) by mouth daily before breakfast.  30 tablet  0  . levofloxacin (LEVAQUIN) 500 MG tablet Take 1 tablet (500 mg total) by mouth every other day.  3 tablet  0  . lisinopril (PRINIVIL,ZESTRIL) 10 MG tablet Take 10 mg by mouth daily.      . NIFEdipine (PROCARDIA XL/ADALAT-CC) 90 MG 24 hr tablet Take 1 tablet by mouth daily.      . oxyCODONE (OXY IR/ROXICODONE) 5 MG immediate release tablet Take 1 tablet (5 mg total) by mouth every 6 (six) hours as needed.  15 tablet  0  . oxyCODONE-acetaminophen (ROXICET) 5-325 MG per tablet Take 1 tablet by mouth 2 (two) times daily.  10 tablet  0  . predniSONE (DELTASONE) 20 MG tablet Take 1 tablet (20 mg total) by mouth daily with breakfast.  5 tablet  0   No current facility-administered medications on file prior to visit.      No Known Allergies  Review of Systems (Positive items checked otherwise negative)  General: [ ] Weight loss, [ ] Weight gain, [ ]  Loss of appetite, [ ] Fever  Neurologic: [ ] Dizziness, [ ] Blackouts, [ ] Headaches, [ ] Seizure  Ear/Nose/Throat: [ ] Change in eyesight, [ ] Change in hearing, [ ] Nose bleeds, [ ] Sore throat  Vascular: [ ] Pain in legs with walking, [ ] Pain in feet while lying flat, [ ] Non-healing ulcer, Stroke, [ ] "Mini stroke", [ ] Slurred speech, [ ] Temporary blindness, [ ] Blood clot in vein, [ ] Phlebitis  Pulmonary: [ ] Home oxygen, [ ] Productive cough, [ ] Bronchitis, [ ] Coughing up blood,  [ ] Asthma, [ ] Wheezing  Musculoskeletal: [ ] Arthritis, [ ] Joint pain, [ ] Muscle pain  Cardiac: [ ] Chest pain, [ ] Chest tightness/pressure, [ ] Shortness of breath when lying flat, [ ] Shortness of breath with exertion, [ ] Palpitations, [ ] Heart murmur, [ ] Arrythmia,  [ ] Atrial fibrillation  Hematologic: [ ]  Bleeding problems, [ ] Clotting disorder, [ ] Anemia  Psychiatric:  [ ] Depression, [ ] Anxiety, [ ] Attention deficit disorder  Gastrointestinal:  [ ] Black stool,[ ]  Blood in stool, [ ] Peptic ulcer disease, [ ] Reflux, [ ] Hiatal hernia, [ ] Trouble swallowing, [ ] Diarrhea, [ ] Constipation  Urinary:  [x] Kidney disease, [ ] Burning with urination, [ ] Frequent urination, [ ] Difficulty urinating  Skin: [ ] Ulcers, [ ] Rashes     Physical Examination  Filed Vitals:   02/16/13 1506  BP: 121/75  Pulse: 95  Height: 6' 3" (1.905 m)  Weight: 284 lb (128.822 kg)  SpO2: 95%   Body mass index is 35.5 kg/(m^2).  General: A&O x 3, WD, morbidly obese  Pulmonary: Sym exp, good air movt, CTAB, no rales, rhonchi, & wheezing  Cardiac: RRR, Nl S1, S2, no Murmurs, rubs or gallops  Gastrointestinal: soft, NTND, -G/R, - HSM, - masses, - CVAT B  Musculoskeletal: M/S 5/5 throughout , Extremities without  ischemic changes , weakly palpable thrill in access , weak bruit in access  Neurologic: Pain and light touch intact in extremities , Motor exam as listed above  Non-Invasive Vascular Imaging  L arm access duplex (Date: 02/16/13):   Calcified valve leaflets causing outflow stenosis  Fistula > 6 mm throughout  Tortuous course  Outside Studies/Documentation 3 pages of outside documents were reviewed including: nephrology character.  Medical Decision Making  Nicolas Mccann is a 67 y.o. 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   Based on his study, I would recommend L arm fistulogram, with possible intervention.  Unfortunately due to his dialysis schedule, the earliest I can schedule him is 28 APR 14.  I discussed with the patient the nature of angiographic procedures, especially the limited patencies of any endovascular intervention.  The patient is aware of that the risks of an angiographic procedure include but are not limited to:  bleeding, infection, access site complications, renal failure, embolization, rupture of vessel, dissection, possible need for emergent surgical intervention, possible need for surgical procedures to treat the patient's pathology, and stroke and death.    The patient is aware of the risks and agrees to proceed.  Osceola Depaz, MD [facility] [place] Office: [phone] Pager: [phone]  02/16/2013, 3:55 PM   

## 2013-02-27 ENCOUNTER — Other Ambulatory Visit: Payer: Self-pay

## 2013-03-02 ENCOUNTER — Encounter (HOSPITAL_COMMUNITY): Payer: Self-pay | Admitting: Pharmacy Technician

## 2013-03-05 ENCOUNTER — Ambulatory Visit (HOSPITAL_COMMUNITY)
Admission: RE | Admit: 2013-03-05 | Discharge: 2013-03-05 | Disposition: A | Discharge status: Home / Self Care | Payer: Medicare Other | Source: Ambulatory Visit | Attending: Vascular Surgery | Admitting: Vascular Surgery

## 2013-03-05 ENCOUNTER — Encounter (HOSPITAL_COMMUNITY)
Admission: RE | Disposition: A | Discharge status: Home / Self Care | Payer: Self-pay | Source: Ambulatory Visit | Attending: Vascular Surgery

## 2013-03-05 SURGERY — ASSESSMENT, SHUNT FUNCTION, WITH CONTRAST RADIOGRAPHIC STUDY
Anesthesia: LOCAL | Laterality: Left

## 2013-03-05 MED ORDER — SODIUM CHLORIDE 0.9 % IJ SOLN: 3.0000 mL | INTRAMUSCULAR | Status: DC | PRN

## 2013-03-05 NOTE — Interval H&P Note (Signed)
[facility] [place]  History and Physical Update  The patient was interviewed and re-examined.  The patient's previous History and Physical has been reviewed and is unchanged from my consult on: 02/16/13.  There is no change in the plan of care.  Leonides Sake, MD [facility] [place] Office: [phone] Pager: [phone]  03/05/2013, 8:10 AM

## 2013-03-05 NOTE — H&P (View-Only) (Signed)
[facility] [place]  Established Dialysis Access  History of Present Illness  Nicolas Mccann is a 67 y.o. (06/08/1946) male who presents for re-evaluation of left BC AVF.  The patient most recently had a superficialization of L BC AVF (10/16/12).  Patient has sent by his nephrologist with difficulty cannulating and poor flow rates.  The patient notes some mild numbness in his left hand.  Past Medical History  Diagnosis Date  . Diabetes mellitus without complication   . ESRD on hemodialysis     [facility], [name], ESRD due to DM/HTN, started dialysis in Sep 12, 2012  . Obesity   . Gout     Past Surgical History  Procedure Laterality Date  . Insertion of dialysis catheter  09/15/2012    Procedure: INSERTION OF DIALYSIS CATHETER;  Surgeon: Christopher S Dickson, MD;  Location: [facility] OR;  Service: Vascular;  Laterality: N/A;  . Menisectomy      L knee  . Av fistula placement Left     History   Social History  . Marital Status: Married    Spouse Name: N/A    Number of Children: N/A  . Years of Education: N/A   Occupational History  . Not on file.   Social History Main Topics  . Smoking status: Never Smoker   . Smokeless tobacco: Former User    Quit date: 11/28/1962  . Alcohol Use: No  . Drug Use: No  . Sexually Active: Not on file   Other Topics Concern  . Not on file   Social History Narrative  . No narrative on file    Family History  Problem Relation Age of Onset  . Diabetes Mellitus II    . Hypertension    . Hypertension Mother   . Diabetes Mother   . Hypertension Father     Current Outpatient Prescriptions on File Prior to Visit  Medication Sig Dispense Refill  . losartan (COZAAR) 50 MG tablet Take 50 mg by mouth daily.      . multivitamin (RENA-VIT) TABS tablet Take 1 tablet by mouth daily.  30 tablet  0  . sevelamer (RENVELA) 800 MG tablet Take 800 mg by mouth 3 (three) times daily with meals.      . amitriptyline (ELAVIL) 25 MG tablet Take  25 mg by mouth at bedtime.      . docusate sodium (COLACE) 100 MG capsule Take 100 mg by mouth daily as needed. For hard stool      . glipiZIDE (GLUCOTROL) 2.5 mg TABS Take 0.5 tablets (2.5 mg total) by mouth daily before breakfast.  30 tablet  0  . levofloxacin (LEVAQUIN) 500 MG tablet Take 1 tablet (500 mg total) by mouth every other day.  3 tablet  0  . lisinopril (PRINIVIL,ZESTRIL) 10 MG tablet Take 10 mg by mouth daily.      . NIFEdipine (PROCARDIA XL/ADALAT-CC) 90 MG 24 hr tablet Take 1 tablet by mouth daily.      . oxyCODONE (OXY IR/ROXICODONE) 5 MG immediate release tablet Take 1 tablet (5 mg total) by mouth every 6 (six) hours as needed.  15 tablet  0  . oxyCODONE-acetaminophen (ROXICET) 5-325 MG per tablet Take 1 tablet by mouth 2 (two) times daily.  10 tablet  0  . predniSONE (DELTASONE) 20 MG tablet Take 1 tablet (20 mg total) by mouth daily with breakfast.  5 tablet  0   No current facility-administered medications on file prior to visit.      No Known Allergies  Review of Systems (Positive items checked otherwise negative)  General: [ ] Weight loss, [ ] Weight gain, [ ]  Loss of appetite, [ ] Fever  Neurologic: [ ] Dizziness, [ ] Blackouts, [ ] Headaches, [ ] Seizure  Ear/Nose/Throat: [ ] Change in eyesight, [ ] Change in hearing, [ ] Nose bleeds, [ ] Sore throat  Vascular: [ ] Pain in legs with walking, [ ] Pain in feet while lying flat, [ ] Non-healing ulcer, Stroke, [ ] "Mini stroke", [ ] Slurred speech, [ ] Temporary blindness, [ ] Blood clot in vein, [ ] Phlebitis  Pulmonary: [ ] Home oxygen, [ ] Productive cough, [ ] Bronchitis, [ ] Coughing up blood,  [ ] Asthma, [ ] Wheezing  Musculoskeletal: [ ] Arthritis, [ ] Joint pain, [ ] Muscle pain  Cardiac: [ ] Chest pain, [ ] Chest tightness/pressure, [ ] Shortness of breath when lying flat, [ ] Shortness of breath with exertion, [ ] Palpitations, [ ] Heart murmur, [ ] Arrythmia,  [ ] Atrial fibrillation  Hematologic: [ ]  Bleeding problems, [ ] Clotting disorder, [ ] Anemia  Psychiatric:  [ ] Depression, [ ] Anxiety, [ ] Attention deficit disorder  Gastrointestinal:  [ ] Black stool,[ ]  Blood in stool, [ ] Peptic ulcer disease, [ ] Reflux, [ ] Hiatal hernia, [ ] Trouble swallowing, [ ] Diarrhea, [ ] Constipation  Urinary:  [x] Kidney disease, [ ] Burning with urination, [ ] Frequent urination, [ ] Difficulty urinating  Skin: [ ] Ulcers, [ ] Rashes     Physical Examination  Filed Vitals:   02/16/13 1506  BP: 121/75  Pulse: 95  Height: 6' 3" (1.905 m)  Weight: 284 lb (128.822 kg)  SpO2: 95%   Body mass index is 35.5 kg/(m^2).  General: A&O x 3, WD, morbidly obese  Pulmonary: Sym exp, good air movt, CTAB, no rales, rhonchi, & wheezing  Cardiac: RRR, Nl S1, S2, no Murmurs, rubs or gallops  Gastrointestinal: soft, NTND, -G/R, - HSM, - masses, - CVAT B  Musculoskeletal: M/S 5/5 throughout , Extremities without  ischemic changes , weakly palpable thrill in access , weak bruit in access  Neurologic: Pain and light touch intact in extremities , Motor exam as listed above  Non-Invasive Vascular Imaging  L arm access duplex (Date: 02/16/13):   Calcified valve leaflets causing outflow stenosis  Fistula > 6 mm throughout  Tortuous course  Outside Studies/Documentation 3 pages of outside documents were reviewed including: nephrology character.  Medical Decision Making  Nicolas Mccann is a 67 y.o. 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   Based on his study, I would recommend L arm fistulogram, with possible intervention.  Unfortunately due to his dialysis schedule, the earliest I can schedule him is 28 APR 14.  I discussed with the patient the nature of angiographic procedures, especially the limited patencies of any endovascular intervention.  The patient is aware of that the risks of an angiographic procedure include but are not limited to:  bleeding, infection, access site complications, renal failure, embolization, rupture of vessel, dissection, possible need for emergent surgical intervention, possible need for surgical procedures to treat the patient's pathology, and stroke and death.    The patient is aware of the risks and agrees to proceed.  Manjit Bufano, MD [facility] [place] Office: [phone] Pager: [phone]  02/16/2013, 3:55 PM   

## 2013-03-05 NOTE — Interval H&P Note (Signed)
[facility] [place]  History and Physical Update  Patient now wants to be rescheduled due a scheduling conflict. Case is canceled for today.  Leonides Sake, MD [facility] [place] Office: [phone] Pager: [phone]  03/05/2013, 10:19 AM

## 2013-03-09 ENCOUNTER — Encounter (HOSPITAL_COMMUNITY): Payer: Self-pay

## 2013-03-12 ENCOUNTER — Other Ambulatory Visit: Payer: Self-pay | Admitting: *Deleted

## 2013-03-12 ENCOUNTER — Telehealth: Payer: Self-pay | Admitting: Vascular Surgery

## 2013-03-12 ENCOUNTER — Ambulatory Visit (HOSPITAL_COMMUNITY)
Admission: RE | Admit: 2013-03-12 | Discharge: 2013-03-12 | Disposition: A | Discharge status: Home / Self Care | Payer: Medicare Other | Source: Ambulatory Visit | Attending: Vascular Surgery | Admitting: Vascular Surgery

## 2013-03-12 ENCOUNTER — Encounter (HOSPITAL_COMMUNITY)
Admission: RE | Disposition: A | Discharge status: Home / Self Care | Payer: Self-pay | Source: Ambulatory Visit | Attending: Vascular Surgery

## 2013-03-12 DIAGNOSIS — Z01810 Encounter for preprocedural cardiovascular examination: Secondary | ICD-10-CM

## 2013-03-12 DIAGNOSIS — I871 Compression of vein: Secondary | ICD-10-CM | POA: Insufficient documentation

## 2013-03-12 DIAGNOSIS — Z79899 Other long term (current) drug therapy: Secondary | ICD-10-CM | POA: Insufficient documentation

## 2013-03-12 DIAGNOSIS — N186 End stage renal disease: Secondary | ICD-10-CM | POA: Insufficient documentation

## 2013-03-12 DIAGNOSIS — M109 Gout, unspecified: Secondary | ICD-10-CM | POA: Insufficient documentation

## 2013-03-12 DIAGNOSIS — T82898A Other specified complication of vascular prosthetic devices, implants and grafts, initial encounter: Secondary | ICD-10-CM

## 2013-03-12 DIAGNOSIS — Z6835 Body mass index (BMI) 35.0-35.9, adult: Secondary | ICD-10-CM | POA: Insufficient documentation

## 2013-03-12 DIAGNOSIS — Y832 Surgical operation with anastomosis, bypass or graft as the cause of abnormal reaction of the patient, or of later complication, without mention of misadventure at the time of the procedure: Secondary | ICD-10-CM | POA: Insufficient documentation

## 2013-03-12 DIAGNOSIS — Z992 Dependence on renal dialysis: Secondary | ICD-10-CM | POA: Insufficient documentation

## 2013-03-12 DIAGNOSIS — E119 Type 2 diabetes mellitus without complications: Secondary | ICD-10-CM | POA: Insufficient documentation

## 2013-03-12 HISTORY — PX: SHUNTOGRAM: SHX5491

## 2013-03-12 LAB — POCT I-STAT, CHEM 8
Creatinine, Ser: 8.9 mg/dL — ABNORMAL HIGH (ref 0.50–1.35)
HCT: 35 % — ABNORMAL LOW (ref 39.0–52.0)
Hemoglobin: 11.9 g/dL — ABNORMAL LOW (ref 13.0–17.0)
Potassium: 4.6 mEq/L (ref 3.5–5.1)
Sodium: 139 mEq/L (ref 135–145)
TCO2: 27 mmol/L (ref 0–100)

## 2013-03-12 SURGERY — ASSESSMENT, SHUNT FUNCTION, WITH CONTRAST RADIOGRAPHIC STUDY
Anesthesia: LOCAL | Laterality: Right

## 2013-03-12 MED ORDER — SODIUM CHLORIDE 0.9 % IJ SOLN: 3.0000 mL | INTRAMUSCULAR | Status: DC | PRN

## 2013-03-12 MED ORDER — SODIUM CHLORIDE 0.9 % IV SOLN: 250.0000 mL | INTRAVENOUS | Status: DC | PRN

## 2013-03-12 MED ORDER — LIDOCAINE HCL (PF) 1 % IJ SOLN
INTRAMUSCULAR | Status: AC
  Filled 2013-03-12: qty 30

## 2013-03-12 MED ORDER — SODIUM CHLORIDE 0.9 % IJ SOLN: 3.0000 mL | Freq: Two times a day (BID) | INTRAMUSCULAR | Status: DC

## 2013-03-12 NOTE — Op Note (Signed)
OPERATIVE NOTE   PROCEDURE: 1. left brachiocephalic arteriovenous fistula cannulation under ultrasound guidance 2. left arm fistulogram 3. Right arm and central venogram  PRE-OPERATIVE DIAGNOSIS: Malfunctioning left arteriovenous fistula  POST-OPERATIVE DIAGNOSIS: same as above   SURGEON: Leonides Sake, MD  ANESTHESIA: local  ESTIMATED BLOOD LOSS: 5 cc  FINDING(S): 1. Multiple loops in left brachiocephalic arteriovenous fistula   2. 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 Small right cephalic vein throughout: forearm segment with multiple branches, upper arm segment somewhat sclerotic in appearance 4. Widely patent brachial and basilic vein  5. Patent central venous structures  SPECIMEN(S):  None  CONTRAST: 65 cc  INDICATIONS: Nicolas Mccann is a 67 y.o. male who  presents with malfunctioning left brachiocephalic arteriovenous fistula.  The patient is scheduled for left brachiocephalic fistulogram.  The patient is aware the risks include but are not limited to: bleeding, infection, thrombosis of the cannulated access, and possible anaphylactic reaction to the contrast.  The patient is aware of the risks of the procedure and elects to proceed forward.  DESCRIPTION: After full informed written consent was obtained, the patient was brought back to the angiography suite and placed supine upon the angiography table.  The patient was connected to monitoring equipment.  The left arm was prepped and draped in the standard fashion for a left arm fistulogram.  Under ultrasound guidance, the left brachiocephalic arteriovenous fistula was cannulated with a micropuncture needle.  The microwire was advanced into the fistula and the needle was exchanged for the a microsheath, which was lodged 2 cm into the access.  The wire was removed and the sheath was connected to the IV extension tubing.  Hand injections were completed to image the access from the  antecubitum up to the level of axilla.  The central venous structures were also imaged by hand injections.  Based on the images, this patient will need: new access.  I tried to pass a Benson wire through the sheath, but the wire is unable to traverse the multiple loops.  A 4-0 Monocryl purse-string suture was sewn around the sheath.  The sheath was removed while tying down the suture.  A sterile bandage was applied to the puncture site.  At this point, I discussed with the patient proceeding with an unscheduled right arm venogram to determine his access options in the right arm as the left arm brachiocephalic arteriovenous fistula will occlude eventually with the high grade stenosis in the subclavian vein.  At this point, we connected the right hand IV to IV extension tubing and performed a right arm and central venogram.  The findings are listed above.  COMPLICATIONS: none  CONDITION: stable  Leonides Sake, MD [facility] [place] Office: [phone] Pager: [phone]  03/12/2013 12:14 PM

## 2013-03-12 NOTE — Telephone Encounter (Addendum)
Message copied by Rosalyn Charters on Mon Mar 12, 2013  3:02 PM ------      Message from: Melene Plan      Created: Mon Mar 12, 2013 12:28 PM                   ----- Message -----         From: Fransisco Hertz, MD         Sent: 03/12/2013  12:20 PM           To: Reuel Derby, Melene Plan, RN            Nicolas Mccann      [id number]      1945-11-18            PROCEDURE:      1. left brachiocephalic arteriovenous fistula cannulation under ultrasound guidance      2. left arm fistulogram      3. Right arm and central venogram            Follow-up: 2-4 weeks            Orders(s) for follow-up: R arm vein mapping    notified patient of fu appt. with blc on 04-20-13 12:30     ------

## 2013-03-12 NOTE — Interval H&P Note (Signed)
[facility] [place]  History and Physical Update  The patient was interviewed and re-examined.  The patient's previous History and Physical has been reviewed and is unchanged.  There is no change in the plan of care: L arm fistulogram, with possible intervention.  Leonides Sake, MD [facility] [place] Office: [phone] Pager: [phone]  03/12/2013, 10:24 AM

## 2013-03-12 NOTE — H&P (View-Only) (Signed)
[facility] [place]  Established Dialysis Access  History of Present Illness  Nicolas Mccann is a 67 y.o. (February 02, 1946) male who presents for re-evaluation of left BC AVF.  The patient most recently had a superficialization of L BC AVF (10/16/12).  Patient has sent by his nephrologist with difficulty cannulating and poor flow rates.  The patient notes some mild numbness in his left hand.  Past Medical History  Diagnosis Date  . Diabetes mellitus without complication   . ESRD on hemodialysis     [name], [name], ESRD due to DM/HTN, started dialysis in Sep 12, 2012  . Obesity   . Gout     Past Surgical History  Procedure Laterality Date  . Insertion of dialysis catheter  09/15/2012    Procedure: INSERTION OF DIALYSIS CATHETER;  Surgeon: Chuck Hint, MD;  Location: [facility] OR;  Service: Vascular;  Laterality: N/A;  . Menisectomy      L knee  . Av fistula placement Left     History   Social History  . Marital Status: Married    Spouse Name: N/A    Number of Children: N/A  . Years of Education: N/A   Occupational History  . Not on file.   Social History Main Topics  . Smoking status: Never Smoker   . Smokeless tobacco: Former Neurosurgeon    Quit date: 11/28/1962  . Alcohol Use: No  . Drug Use: No  . Sexually Active: Not on file   Other Topics Concern  . Not on file   Social History Narrative  . No narrative on file    Family History  Problem Relation Age of Onset  . Diabetes Mellitus II    . Hypertension    . Hypertension Mother   . Diabetes Mother   . Hypertension Father     Current Outpatient Prescriptions on File Prior to Visit  Medication Sig Dispense Refill  . losartan (COZAAR) 50 MG tablet Take 50 mg by mouth daily.      . multivitamin (RENA-VIT) TABS tablet Take 1 tablet by mouth daily.  30 tablet  0  . sevelamer (RENVELA) 800 MG tablet Take 800 mg by mouth 3 (three) times daily with meals.      Marland Kitchen amitriptyline (ELAVIL) 25 MG tablet Take  25 mg by mouth at bedtime.      . docusate sodium (COLACE) 100 MG capsule Take 100 mg by mouth daily as needed. For hard stool      . glipiZIDE (GLUCOTROL) 2.5 mg TABS Take 0.5 tablets (2.5 mg total) by mouth daily before breakfast.  30 tablet  0  . levofloxacin (LEVAQUIN) 500 MG tablet Take 1 tablet (500 mg total) by mouth every other day.  3 tablet  0  . lisinopril (PRINIVIL,ZESTRIL) 10 MG tablet Take 10 mg by mouth daily.      Marland Kitchen NIFEdipine (PROCARDIA XL/ADALAT-CC) 90 MG 24 hr tablet Take 1 tablet by mouth daily.      Marland Kitchen oxyCODONE (OXY IR/ROXICODONE) 5 MG immediate release tablet Take 1 tablet (5 mg total) by mouth every 6 (six) hours as needed.  15 tablet  0  . oxyCODONE-acetaminophen (ROXICET) 5-325 MG per tablet Take 1 tablet by mouth 2 (two) times daily.  10 tablet  0  . predniSONE (DELTASONE) 20 MG tablet Take 1 tablet (20 mg total) by mouth daily with breakfast.  5 tablet  0   No current facility-administered medications on file prior to visit.  No Known Allergies  Review of Systems (Positive items checked otherwise negative)  General: [ ]  Weight loss, [ ]  Weight gain, [ ]   Loss of appetite, [ ]  Fever  Neurologic: [ ]  Dizziness, [ ]  Blackouts, [ ]  Headaches, [ ]  Seizure  Ear/Nose/Throat: [ ]  Change in eyesight, [ ]  Change in hearing, [ ]  Nose bleeds, [ ]  Sore throat  Vascular: [ ]  Pain in legs with walking, [ ]  Pain in feet while lying flat, [ ]  Non-healing ulcer, Stroke, [ ]  "Mini stroke", [ ]  Slurred speech, [ ]  Temporary blindness, [ ]  Blood clot in vein, [ ]  Phlebitis  Pulmonary: [ ]  Home oxygen, [ ]  Productive cough, [ ]  Bronchitis, [ ]  Coughing up blood,  [ ]  Asthma, [ ]  Wheezing  Musculoskeletal: [ ]  Arthritis, [ ]  Joint pain, [ ]  Muscle pain  Cardiac: [ ]  Chest pain, [ ]  Chest tightness/pressure, [ ]  Shortness of breath when lying flat, [ ]  Shortness of breath with exertion, [ ]  Palpitations, [ ]  Heart murmur, [ ]  Arrythmia,  [ ]  Atrial fibrillation  Hematologic: [ ]   Bleeding problems, [ ]  Clotting disorder, [ ]  Anemia  Psychiatric:  [ ]  Depression, [ ]  Anxiety, [ ]  Attention deficit disorder  Gastrointestinal:  [ ]  Black stool,[ ]   Blood in stool, [ ]  Peptic ulcer disease, [ ]  Reflux, [ ]  Hiatal hernia, [ ]  Trouble swallowing, [ ]  Diarrhea, [ ]  Constipation  Urinary:  [x]  Kidney disease, [ ]  Burning with urination, [ ]  Frequent urination, [ ]  Difficulty urinating  Skin: [ ]  Ulcers, [ ]  Rashes     Physical Examination  Filed Vitals:   02/16/13 1506  BP: 121/75  Pulse: 95  Height: 6\' 3"  (1.905 m)  Weight: 284 lb (128.822 kg)  SpO2: 95%   Body mass index is 35.5 kg/(m^2).  General: A&O x 3, WD, morbidly obese  Pulmonary: Sym exp, good air movt, CTAB, no rales, rhonchi, & wheezing  Cardiac: RRR, Nl S1, S2, no Murmurs, rubs or gallops  Gastrointestinal: soft, NTND, -G/R, - HSM, - masses, - CVAT B  Musculoskeletal: M/S 5/5 throughout , Extremities without  ischemic changes , weakly palpable thrill in access , weak bruit in access  Neurologic: Pain and light touch intact in extremities , Motor exam as listed above  Non-Invasive Vascular Imaging  L arm access duplex (Date: 02/16/13):   Calcified valve leaflets causing outflow stenosis  Fistula > 6 mm throughout  Tortuous course  Outside Studies/Documentation 3 pages of outside documents were reviewed including: nephrology character.  Medical Decision Making  Nicolas Mccann is a 67 y.o. 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   Based on his study, I would recommend L arm fistulogram, with possible intervention.  Unfortunately due to his dialysis schedule, the earliest I can schedule him is 28 APR 14.  I discussed with the patient the nature of angiographic procedures, especially the limited patencies of any endovascular intervention.  The patient is aware of that the risks of an angiographic procedure include but are not limited to:  bleeding, infection, access site complications, renal failure, embolization, rupture of vessel, dissection, possible need for emergent surgical intervention, possible need for surgical procedures to treat the patient's pathology, and stroke and death.    The patient is aware of the risks and agrees to proceed.  Leonides Sake, MD [facility] [place] Office: [phone] Pager: [phone]  02/16/2013, 3:55 PM

## 2013-04-19 ENCOUNTER — Encounter: Payer: Self-pay | Admitting: Vascular Surgery

## 2013-04-20 ENCOUNTER — Ambulatory Visit (INDEPENDENT_AMBULATORY_CARE_PROVIDER_SITE_OTHER): Payer: Medicare Other | Admitting: Vascular Surgery

## 2013-04-20 ENCOUNTER — Encounter: Payer: Self-pay | Admitting: Vascular Surgery

## 2013-04-20 ENCOUNTER — Encounter (INDEPENDENT_AMBULATORY_CARE_PROVIDER_SITE_OTHER): Payer: Medicare Other | Admitting: Vascular Surgery

## 2013-04-20 VITALS — BP 119/75 | HR 102 | Ht 75.0 in | Wt 292.0 lb

## 2013-04-20 DIAGNOSIS — N186 End stage renal disease: Secondary | ICD-10-CM

## 2013-04-20 DIAGNOSIS — Z01810 Encounter for preprocedural cardiovascular examination: Secondary | ICD-10-CM

## 2013-04-20 NOTE — Progress Notes (Signed)
[facility] [place]  Established Dialysis Access  History of Present Illness  Nicolas Mccann is a 67 y.o. (1945/12/12) male who presents for re-evaluation for permanent access.  The patient is right hand dominant.  Previous access procedures have been completed in the left arm.  The patient's complication from previous access procedures include: stenoses.  The patient has never had a previous PPM placed.  On recent, L arm fistulogram I was unable to traverse the multiple loops he had in the fistula to intervene on a subclavian vein high grade stenosis.  The patient returns today for R arm vein mapping.  Past Medical History  Diagnosis Date  . Diabetes mellitus without complication   . ESRD on hemodialysis     [name], [name], ESRD due to DM/HTN, started dialysis in Sep 12, 2012  . Obesity   . Gout     Past Surgical History  Procedure Laterality Date  . Insertion of dialysis catheter  09/15/2012    Procedure: INSERTION OF DIALYSIS CATHETER;  Surgeon: Chuck Hint, MD;  Location: [facility] OR;  Service: Vascular;  Laterality: N/A;  . Menisectomy      L knee  . Av fistula placement Left     History   Social History  . Marital Status: Married    Spouse Name: N/A    Number of Children: N/A  . Years of Education: N/A   Occupational History  . Not on file.   Social History Main Topics  . Smoking status: Never Smoker   . Smokeless tobacco: Former Neurosurgeon    Quit date: 11/28/1962  . Alcohol Use: No  . Drug Use: No  . Sexually Active: Not on file   Other Topics Concern  . Not on file   Social History Narrative  . No narrative on file    Family History  Problem Relation Age of Onset  . Diabetes Mellitus II    . Hypertension    . Hypertension Mother   . Diabetes Mother   . Hypertension Father     Current Outpatient Prescriptions on File Prior to Visit  Medication Sig Dispense Refill  . amitriptyline (ELAVIL) 25 MG tablet Take 25 mg by mouth at bedtime.       Marland Kitchen COLCRYS 0.6 MG tablet Take 0.6 mg by mouth daily.       Marland Kitchen losartan (COZAAR) 50 MG tablet Take 50 mg by mouth daily.      . multivitamin (RENA-VIT) TABS tablet Take 1 tablet by mouth daily.  30 tablet  0  . SENSIPAR 30 MG tablet Take 30 mg by mouth daily.       . sevelamer (RENVELA) 800 MG tablet Take 2,400 mg by mouth 3 (three) times daily with meals.        No current facility-administered medications on file prior to visit.    No Known Allergies  REVIEW OF SYSTEMS:  (Positives checked otherwise negative)  CARDIOVASCULAR:  []  chest pain, []  chest pressure, []  palpitations, []  shortness of breath when laying flat, []  shortness of breath with exertion,  []  pain in feet when walking, []  pain in feet when laying flat, []  history of blood clot in veins (DVT), []  history of phlebitis, []  swelling in legs, []  varicose veins  PULMONARY:  []  productive cough, []  asthma, []  wheezing  NEUROLOGIC:  []  weakness in arms or legs, []  numbness in arms or legs, []  difficulty speaking or slurred speech, []  temporary loss of vision in one  eye, []  dizziness  HEMATOLOGIC:  []  bleeding problems, []  problems with blood clotting too easily  MUSCULOSKEL:  []  joint pain, []  joint swelling  GASTROINTEST:  []  vomiting blood, []  blood in stool     GENITOURINARY:  []  burning with urination, []  blood in urine, [x]  ESRD-HD  PSYCHIATRIC:  []  history of major depression  INTEGUMENTARY:  []  rashes, []  ulcers  Physical Examination  Filed Vitals:   04/20/13 1259  BP: 119/75  Pulse: 102  Height: 6\' 3"  (1.905 m)  Weight: 292 lb (132.45 kg)  SpO2: 100%   Body mass index is 36.5 kg/(m^2).  General: A&O x 3, WD, morbidly obese  Pulmonary: Sym exp, good air movt, CTAB, no rales, rhonchi, & wheezing  Cardiac: RRR, Nl S1, S2, no Murmurs, rubs or gallops  Gastrointestinal: soft, NTND, -G/R, - HSM, - masses, - CVAT B  Musculoskeletal: M/S 5/5 throughout , Extremities without  ischemic changes , pulsatile  L BC AVF, + bruit in access  Neurologic: Pain and light touch intact in extremities , Motor exam as listed above  Non-Invasive Vascular Imaging  Vein Mapping  (Date: 04/20/13):   R arm: acceptable vein conduits include upper arm cephalic vein and upper arm basilic vein  Medical Decision Making  Nicolas Mccann is a 67 y.o. male who presents with ESRD requiring hemodialysis, L BC AVF venous outflow stenosis  Based on vein mapping and examination, this patient's permanent access options include: R BC AVF vs BVT.  On venogram, the R cephalic vein does not appear usable despite the large diameter on vein mapping.   The basilic vein looks to be a better conduit.  The patient would like another attempt at salvage of the L BC AVF before giving up.  After reviewing his fistulogram, I agree that attempting a high proximal cephalic vein cannulation might be possible to bypass all of the loops.   At this point, we will schedule a repeat fistulogram of the L arm, with likely SCV venoplasty.  Due to scheduling issues, the pt is going to let us know when he can proceed.  I discussed with the patient the nature of angiographic procedures, especially the limited patencies of any endovascular intervention.  The patient is aware of that the risks of an angiographic procedure include but are not limited to: bleeding, infection, access site complications, renal failure, embolization, rupture of vessel, dissection, possible need for emergent surgical intervention, possible need for surgical procedures to treat the patient's pathology, anaphylactic reaction to contrast, and stroke and death.    The patient is aware of the risks and agrees to proceed.  Leonides Sake, MD [facility] [place] Office: [phone] Pager: [phone]  04/20/2013, 1:32 PM

## 2013-08-09 ENCOUNTER — Encounter: Payer: Self-pay | Admitting: Vascular Surgery

## 2013-08-10 ENCOUNTER — Encounter: Payer: Self-pay | Admitting: *Deleted

## 2013-08-10 ENCOUNTER — Ambulatory Visit (INDEPENDENT_AMBULATORY_CARE_PROVIDER_SITE_OTHER): Payer: Medicare Other | Admitting: Vascular Surgery

## 2013-08-10 ENCOUNTER — Encounter: Payer: Self-pay | Admitting: Vascular Surgery

## 2013-08-10 ENCOUNTER — Other Ambulatory Visit: Payer: Self-pay | Admitting: *Deleted

## 2013-08-10 VITALS — BP 143/91 | HR 110 | Ht 75.0 in | Wt 297.5 lb

## 2013-08-10 DIAGNOSIS — N186 End stage renal disease: Secondary | ICD-10-CM

## 2013-08-10 NOTE — Progress Notes (Signed)
[facility] [place]   Visit to evaluate hemodialysis access  HD on  T,Th, S   HPI: Nicolas Mccann is a 67 y.o. male who had a left Brachiocephalic AVF placed by Dr. Hart Rochester in 2008. This had been functioning well until earlier this year when flow began to diminish. He had a fistulogram done by Dr. Imogene Burn in May of 2014 which showed Multiple loops in left brachiocephalic arteriovenous fistula and Patent left brachiocephalic arteriovenous fistula with high grade subclavian vein stenosis. Because of the multiple loops it was felt at that time that it may be difficult to get a wire across the subclavian stenosis because of the loops more distal in the fistula. The fistula is still being used for HD successfully with decreasing flow rates. The patient denies symptoms of numbness, tingling, weakness and denies pain in the operative limb.  Past Medical History  Diagnosis Date  . Diabetes mellitus without complication   . ESRD on hemodialysis     [name], [name], ESRD due to DM/HTN, started dialysis in Sep 12, 2012  . Obesity   . Gout    History  Substance Use Topics  . Smoking status: Never Smoker   . Smokeless tobacco: Former Neurosurgeon    Quit date: 11/28/1962  . Alcohol Use: No   No Known Allergies  Current Outpatient Prescriptions  Medication Sig Dispense Refill  . amitriptyline (ELAVIL) 25 MG tablet Take 25 mg by mouth at bedtime.      Marland Kitchen b complex-vitamin c-folic acid (NEPHRO-VITE) 0.8 MG TABS tablet Take 0.8 mg by mouth at bedtime.      . SENSIPAR 30 MG tablet Take 30 mg by mouth daily.       Marland Kitchen COLCRYS 0.6 MG tablet Take 0.6 mg by mouth daily.       Marland Kitchen losartan (COZAAR) 50 MG tablet Take 50 mg by mouth daily.      . multivitamin (RENA-VIT) TABS tablet Take 1 tablet by mouth daily.  30 tablet  0  . sevelamer (RENVELA) 800 MG tablet Take 2,400 mg by mouth 3 (three) times daily with meals.       . Sucroferric Oxyhydroxide (VELPHORO) 500 MG CHEW Chew 1 tablet by mouth. Take with  every meal       No current facility-administered medications for this visit.     Physical Examination  Filed Vitals:   08/10/13 1236  BP: 143/91  Pulse: 110    GEN WDWN male in NAD. PULM CTA CV RRR GI soft, NTND left upper extremity Incision is healed Skin color is normal   Hand grip is 5/5 and sensation in digits is intact; There is a good thrill and good bruit in the left [facility] AVF. The graft/fistula is easily palpable and of adequate size  Assessment/Plan Nicolas Mccann is a 67 y.o. year old who has functioning LUE Hemodialysis access with significant left subclavian stenosis. Dr. Imogene Burn reviewed films and thought he might be able to access the fistula higher up on the arm where the vein is straighter and then poss traverse the SCV stenosis and perform a PTA. If this is not possible the pt may need a new access. This is scheduled for Oct 13th, 2014  Clinic MD: [facility]  Addendum  I have independently interviewed and examined the patient, and I agree with the physician assistant's findings.  As a last ditch percutaneous intervention, I offered the patient a very proximal cannulation of the L BC AVF, L arm  fistulogram, possible intervention.  Hopefully, I can cannulate the fistula proximal to the loops and cross the high grade stenosis.  The patient wants to try to salvage his fistula if possible.  I discussed with the patient the nature of angiographic procedures, especially the limited patencies of any endovascular intervention.  The patient is aware of that the risks of an angiographic procedure include but are not limited to: bleeding, infection, access site complications, renal failure, embolization, rupture of vessel, dissection, possible need for emergent surgical intervention, possible need for surgical procedures to treat the patient's pathology, anaphylactic reaction to contrast, and stroke and death.  The patient is aware of the risks and agrees to proceed.  Leonides Sake, MD [facility] [place] Office: [phone] Pager: [phone]  08/10/2013, 6:12 PM

## 2013-08-17 ENCOUNTER — Encounter (HOSPITAL_COMMUNITY): Payer: Self-pay | Admitting: Pharmacy Technician

## 2013-08-19 MED ORDER — SODIUM CHLORIDE 0.9 % IJ SOLN: 3.0000 mL | INTRAMUSCULAR | Status: DC | PRN

## 2013-08-20 ENCOUNTER — Encounter (HOSPITAL_COMMUNITY): Payer: Self-pay | Admitting: *Deleted

## 2013-08-20 ENCOUNTER — Ambulatory Visit (HOSPITAL_COMMUNITY)
Admission: RE | Admit: 2013-08-20 | Discharge: 2013-08-20 | Disposition: A | Discharge status: Home / Self Care | Payer: Medicare Other | Source: Ambulatory Visit | Attending: Vascular Surgery | Admitting: Vascular Surgery

## 2013-08-20 ENCOUNTER — Encounter (HOSPITAL_COMMUNITY)
Admission: RE | Disposition: A | Discharge status: Home / Self Care | Payer: Self-pay | Source: Ambulatory Visit | Attending: Vascular Surgery

## 2013-08-20 DIAGNOSIS — T82898A Other specified complication of vascular prosthetic devices, implants and grafts, initial encounter: Secondary | ICD-10-CM

## 2013-08-20 DIAGNOSIS — I871 Compression of vein: Secondary | ICD-10-CM | POA: Insufficient documentation

## 2013-08-20 DIAGNOSIS — I120 Hypertensive chronic kidney disease with stage 5 chronic kidney disease or end stage renal disease: Secondary | ICD-10-CM | POA: Insufficient documentation

## 2013-08-20 DIAGNOSIS — E1129 Type 2 diabetes mellitus with other diabetic kidney complication: Secondary | ICD-10-CM | POA: Insufficient documentation

## 2013-08-20 DIAGNOSIS — Z992 Dependence on renal dialysis: Secondary | ICD-10-CM | POA: Insufficient documentation

## 2013-08-20 DIAGNOSIS — N186 End stage renal disease: Secondary | ICD-10-CM | POA: Insufficient documentation

## 2013-08-20 DIAGNOSIS — Y832 Surgical operation with anastomosis, bypass or graft as the cause of abnormal reaction of the patient, or of later complication, without mention of misadventure at the time of the procedure: Secondary | ICD-10-CM | POA: Insufficient documentation

## 2013-08-20 DIAGNOSIS — E669 Obesity, unspecified: Secondary | ICD-10-CM | POA: Insufficient documentation

## 2013-08-20 HISTORY — PX: SHUNTOGRAM: SHX5491

## 2013-08-20 LAB — POCT I-STAT, CHEM 8
Chloride: 107 mEq/L (ref 96–112)
Creatinine, Ser: 10.2 mg/dL — ABNORMAL HIGH (ref 0.50–1.35)
Glucose, Bld: 121 mg/dL — ABNORMAL HIGH (ref 70–99)
Hemoglobin: 11.2 g/dL — ABNORMAL LOW (ref 13.0–17.0)
Potassium: 5.2 mEq/L — ABNORMAL HIGH (ref 3.5–5.1)

## 2013-08-20 SURGERY — ASSESSMENT, SHUNT FUNCTION, WITH CONTRAST RADIOGRAPHIC STUDY
Anesthesia: LOCAL | Laterality: Left

## 2013-08-20 MED ORDER — HYDRALAZINE HCL 20 MG/ML IJ SOLN
INTRAMUSCULAR | Status: AC
  Filled 2013-08-20: qty 1

## 2013-08-20 MED ORDER — ACETAMINOPHEN 325 MG PO TABS: 650.0000 mg | ORAL_TABLET | ORAL | Status: DC | PRN

## 2013-08-20 MED ORDER — SODIUM CHLORIDE 0.9 % IJ SOLN: 3.0000 mL | INTRAMUSCULAR | Status: DC | PRN

## 2013-08-20 MED ORDER — HEPARIN (PORCINE) IN NACL 2-0.9 UNIT/ML-% IJ SOLN
INTRAMUSCULAR | Status: AC
  Filled 2013-08-20: qty 500

## 2013-08-20 MED ORDER — SODIUM CHLORIDE 0.9 % IV SOLN: 250.0000 mL | INTRAVENOUS | Status: DC | PRN

## 2013-08-20 MED ORDER — LIDOCAINE HCL (PF) 1 % IJ SOLN
INTRAMUSCULAR | Status: AC
  Filled 2013-08-20: qty 30

## 2013-08-20 MED ORDER — HEPARIN SODIUM (PORCINE) 1000 UNIT/ML IJ SOLN
INTRAMUSCULAR | Status: AC
  Filled 2013-08-20: qty 1

## 2013-08-20 MED ORDER — FENTANYL CITRATE 0.05 MG/ML IJ SOLN
INTRAMUSCULAR | Status: AC
  Filled 2013-08-20: qty 2

## 2013-08-20 MED ORDER — SODIUM CHLORIDE 0.9 % IJ SOLN: 3.0000 mL | Freq: Two times a day (BID) | INTRAMUSCULAR | Status: DC

## 2013-08-20 MED ORDER — ONDANSETRON HCL 4 MG/2ML IJ SOLN: 4.0000 mg | Freq: Four times a day (QID) | INTRAMUSCULAR | Status: DC | PRN

## 2013-08-20 NOTE — Op Note (Signed)
OPERATIVE NOTE   PROCEDURE: 1.  left brachiocephalic arteriovenous fistula cannulation under ultrasound guidance 2.  left arm venoogram 3.  Venoplasty of cephliac vein x 2 (4 mm x 60 mm, 6 mm x 60 mm)  PRE-OPERATIVE DIAGNOSIS: Malfunctioning left brachiocephalic arteriovenous fistula  POST-OPERATIVE DIAGNOSIS: same as above   SURGEON: Leonides Sake, MD  ANESTHESIA: local  ESTIMATED BLOOD LOSS: 5 cc  FINDING(S): 1. Proximal cephalic stenosis > 90%: 4 mm lumen after serial angioplasty 2. Pleats in proximal 1/3 of cephalic vein without associated waists  SPECIMEN(S):  None  CONTRAST: 30 cc  INDICATIONS: Nicolas Mccann is a 67 y.o. male who presents with malfunctioning left brachiocephalic arteriovenous fistula.  The patient is scheduled for left arm fistulogram.  The patient is aware the risks include but are not limited to: bleeding, infection, thrombosis of the cannulated access, and possible anaphylactic reaction to the contrast.  The patient is aware of the risks of the procedure and elects to proceed forward.  DESCRIPTION: After full informed written consent was obtained, the patient was brought back to the angiography suite and placed supine upon the angiography table.  The patient was connected to monitoring equipment.  The left arm was prepped and draped in the standard fashion for a percutaneous access intervention.  From prior fistulogram, I was aware of multiple loops within the fistula which prevented intervention on the proximal vein.  Under ultrasound guidance, the left brachiocephalic arteriovenous fistula was cannulated with a micropuncture needle proximal to the prior loops in the fistula.  The microwire was advanced into the fistula and the needle was exchanged for the a microsheath, which was lodged 2 cm into the access.  The wire was removed and the sheath was connected to the IV extension tubing.  Hand injections were completed to image the access from the central venous  structures to the proximal cephalic vein.  The central venous structures were also imaged by hand injections.  Based on the images, this patient will need: intervention on the proximal cephalic vein.  A Benson wire was advanced into the axillary vein and the sheath was exchanged for a short 6-Fr sheath.  A 4 Fr end-hole catheter was loaded over the wire, and using these two, I was able to eventually navigate into the subclavian vein.  The catheter were removed.  Based on the the imaging, a 4 mm x 60 mm angioplasty balloon was selected.  The balloon was centered around the proximal cephalic vein stenosis and inflated to 10 atm for 1 minute: minimal waist was noted.  I deflated the balloon and also inflated in the proximal 1/3 of the cephalic vein at 10 atm for 1 minute.  No waist was noted.  t this point, the balloon was exchanged for a 6 mm x 60 angioplasty balloon.  The balloon was centered around the proximal stenosis and inflated to 12 atm for 2 minutes: waists were visualized which resolved with extended inflation.  I deflated the balloon and inflated in the proximal 1/3 of the cephalic vein at 12 atm 2 minutes: no waists were visualized despite the presence of pleats on the prior completion injection.  On completion imaging, a ~50% residual stenosis was present.  At this point, I replaced the 6 mm x 60 mm and inflated it at the proximal cephalic vein stenosis at 18 atm for 2 minutes.  The patient again had pain with this inflation, which made me concerned that a 8 mm balloon would rupture his cephalic vein.  Given the difficulty of a surgical approach to this segment of this vein due to high confluence with axillary, I felt the risk of rupture with a 8 mm balloon was not acceptable.  I deflated the balloon and removed the balloon.  Complete injection demonstrated a 4 mm lumen in the proximal cephalic vein.  The prior pulsatile proximal cephalic vein segment was resolved at this point, demonstrating clinical  evidence of improvement of the proximal stenosis.  Based on the completion imaging, no further intervention is necessary.  The wire and balloon were removed from the sheath.  A 4-0 Monocryl purse-string suture was sewn around the sheath.  The sheath was removed while tying down the suture.  A sterile bandage was applied to the puncture site.  COMPLICATIONS: none  CONDITION: stable  Leonides Sake, MD [facility] [place] Office: [phone] Pager: [phone]  08/20/2013 1:57 PM

## 2013-08-20 NOTE — H&P (View-Only) (Signed)
[facility] [place]   Visit to evaluate hemodialysis access  HD on  T,Th, S   HPI: Nicolas Mccann is a 67 y.o. male who had a left Brachiocephalic AVF placed by Dr. Lawson in 2008. This had been functioning well until earlier this year when flow began to diminish. He had a fistulogram done by Dr. Holten Spano in May of 2014 which showed Multiple loops in left brachiocephalic arteriovenous fistula and Patent left brachiocephalic arteriovenous fistula with high grade subclavian vein stenosis. Because of the multiple loops it was felt at that time that it may be difficult to get a wire across the subclavian stenosis because of the loops more distal in the fistula. The fistula is still being used for HD successfully with decreasing flow rates. The patient denies symptoms of numbness, tingling, weakness and denies pain in the operative limb.  Past Medical History  Diagnosis Date  . Diabetes mellitus without complication   . ESRD on hemodialysis     [facility], [name], ESRD due to DM/HTN, started dialysis in Sep 12, 2012  . Obesity   . Gout    History  Substance Use Topics  . Smoking status: Never Smoker   . Smokeless tobacco: Former User    Quit date: 11/28/1962  . Alcohol Use: No   No Known Allergies  Current Outpatient Prescriptions  Medication Sig Dispense Refill  . amitriptyline (ELAVIL) 25 MG tablet Take 25 mg by mouth at bedtime.      . b complex-vitamin c-folic acid (NEPHRO-VITE) 0.8 MG TABS tablet Take 0.8 mg by mouth at bedtime.      . SENSIPAR 30 MG tablet Take 30 mg by mouth daily.       . COLCRYS 0.6 MG tablet Take 0.6 mg by mouth daily.       . losartan (COZAAR) 50 MG tablet Take 50 mg by mouth daily.      . multivitamin (RENA-VIT) TABS tablet Take 1 tablet by mouth daily.  30 tablet  0  . sevelamer (RENVELA) 800 MG tablet Take 2,400 mg by mouth 3 (three) times daily with meals.       . Sucroferric Oxyhydroxide (VELPHORO) 500 MG CHEW Chew 1 tablet by mouth. Take with  every meal       No current facility-administered medications for this visit.     Physical Examination  Filed Vitals:   08/10/13 1236  BP: 143/91  Pulse: 110    GEN WDWN male in NAD. PULM CTA CV RRR GI soft, NTND left upper extremity Incision is healed Skin color is normal   Hand grip is 5/5 and sensation in digits is intact; There is a good thrill and good bruit in the left BC AVF. The graft/fistula is easily palpable and of adequate size  Assessment/Plan Nicolas Mccann is a 67 y.o. year old who has functioning LUE Hemodialysis access with significant left subclavian stenosis. Dr. Adar Rase reviewed films and thought he might be able to access the fistula higher up on the arm where the vein is straighter and then poss traverse the SCV stenosis and perform a PTA. If this is not possible the pt may need a new access. This is scheduled for Oct 13th, 2014  Clinic MD: [facility]  Addendum  I have independently interviewed and examined the patient, and I agree with the physician assistant's findings.  As a last ditch percutaneous intervention, I offered the patient a very proximal cannulation of the L BC AVF, L arm   fistulogram, possible intervention.  Hopefully, I can cannulate the fistula proximal to the loops and cross the high grade stenosis.  The patient wants to try to salvage his fistula if possible.  I discussed with the patient the nature of angiographic procedures, especially the limited patencies of any endovascular intervention.  The patient is aware of that the risks of an angiographic procedure include but are not limited to: bleeding, infection, access site complications, renal failure, embolization, rupture of vessel, dissection, possible need for emergent surgical intervention, possible need for surgical procedures to treat the patient's pathology, anaphylactic reaction to contrast, and stroke and death.  The patient is aware of the risks and agrees to proceed.  Mikhaela Zaugg, MD [facility] [place] Office: [phone] Pager: [phone]  08/10/2013, 6:12 PM   

## 2013-08-20 NOTE — Interval H&P Note (Signed)
[facility] [place]  History and Physical Update  The patient was interviewed and re-examined.  The patient's previous History and Physical has been reviewed and is unchanged.  There is no change in the plan of care: L arm fistulogram, possible intervention.  Leonides Sake, MD [facility] [place] Office: [phone] Pager: [phone]  08/20/2013, 10:48 AM

## 2013-08-21 ENCOUNTER — Telehealth: Payer: Self-pay | Admitting: Vascular Surgery

## 2013-08-21 NOTE — Telephone Encounter (Addendum)
Message copied by Fredrich Birks on Tue Aug 21, 2013  9:33 AM ------      Message from: Phillips Odor      Created: Mon Aug 20, 2013  3:35 PM                   ----- Message -----         From: Fransisco Hertz, MD         Sent: 08/20/2013   2:54 PM           To: Reuel Derby, Melene Plan, RN            CORDNEY BARSTOW      [id number]      October 30, 1946            PROCEDURE:      1.  left brachiocephalic arteriovenous fistula cannulation under ultrasound guidance      2.  left arm venoogram      3.  Venoplasty of cephliac vein x 2 (4 mm x 60 mm, 6 mm x 60 mm)            Follow-up: 4 weeks       ------  08/21/13: was told, "pt is at dialysis, call back". Will mail letter today. dpm

## 2013-09-20 ENCOUNTER — Encounter: Payer: Self-pay | Admitting: Vascular Surgery

## 2013-09-21 ENCOUNTER — Encounter: Payer: Medicare Other | Admitting: Vascular Surgery

## 2013-09-27 ENCOUNTER — Encounter: Payer: Self-pay | Admitting: Vascular Surgery

## 2013-09-28 ENCOUNTER — Encounter: Payer: Medicare Other | Admitting: Vascular Surgery

## 2013-12-21 ENCOUNTER — Ambulatory Visit: Payer: Medicare Other | Admitting: Cardiology

## 2014-01-02 ENCOUNTER — Ambulatory Visit: Payer: Medicare Other | Admitting: Cardiovascular Disease

## 2014-01-18 ENCOUNTER — Ambulatory Visit (INDEPENDENT_AMBULATORY_CARE_PROVIDER_SITE_OTHER): Payer: Medicare Other | Admitting: Cardiovascular Disease

## 2014-01-18 ENCOUNTER — Encounter: Payer: Self-pay | Admitting: Cardiovascular Disease

## 2014-01-18 VITALS — BP 126/98 | HR 92 | Ht 75.0 in | Wt 293.0 lb

## 2014-01-18 DIAGNOSIS — R0600 Dyspnea, unspecified: Secondary | ICD-10-CM | POA: Insufficient documentation

## 2014-01-18 DIAGNOSIS — R079 Chest pain, unspecified: Secondary | ICD-10-CM | POA: Insufficient documentation

## 2014-01-18 DIAGNOSIS — R0609 Other forms of dyspnea: Secondary | ICD-10-CM

## 2014-01-18 DIAGNOSIS — I447 Left bundle-branch block, unspecified: Secondary | ICD-10-CM

## 2014-01-18 DIAGNOSIS — Z992 Dependence on renal dialysis: Secondary | ICD-10-CM

## 2014-01-18 DIAGNOSIS — E1129 Type 2 diabetes mellitus with other diabetic kidney complication: Secondary | ICD-10-CM

## 2014-01-18 DIAGNOSIS — I10 Essential (primary) hypertension: Secondary | ICD-10-CM

## 2014-01-18 DIAGNOSIS — R0989 Other specified symptoms and signs involving the circulatory and respiratory systems: Secondary | ICD-10-CM

## 2014-01-18 DIAGNOSIS — N186 End stage renal disease: Secondary | ICD-10-CM

## 2014-01-18 NOTE — Assessment & Plan Note (Signed)
Discussed low carb diet.  Target hemoglobin A1c is 6.5 or less.  Continue current medications.  

## 2014-01-18 NOTE — Assessment & Plan Note (Signed)
May be deconditioning but need to r/o DCM with echo and assess RV/LV EF and r/o pulmonary hypertension as cause of low BP during dialysis

## 2014-01-18 NOTE — Progress Notes (Signed)
Patient ID: Nicolas Mccann, male   DOB: [birth year], 68 y.o.   MRN: [id number]  68 yo diabetic with HTN  ON dialysis for about a year and a half  [address]  Has had issues with dyspnea and hypotension on dialysis.  He is sedentary has gained a lot of weight.  No PND or orhtopnea mild dependant edema.  Exertional dsypnea progressive over a year.  No history of CHF , CAD or DCM.  Compliant with meds.  12/14 had SSCP and left shoulder pain while on dialysis  ECG no acute changes.  Denies excess ETOH.  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  No wheezing cough or sputm  Has not had recurrent chest pain or pain with exertion  Usually gets fatigued and dyspnic before anything else     ROS: Denies fever, malais, weight loss, blurry vision, decreased visual acuity, cough, sputum, SOB, hemoptysis, pleuritic pain, palpitaitons, heartburn, abdominal pain, melena, lower extremity edema, claudication, or rash.  All other systems reviewed and negative   General: Affect appropriate Overweight black male  HEENT: normal Neck supple with no adenopathy JVP normal no bruits no thyromegaly Lungs clear with no wheezing and good diaphragmatic motion Heart:  S1/S2 no murmur,rub, gallop or click PMI normal Abdomen: benighn, BS positve, no tenderness, no AAA no bruit.  No HSM or HJR Distal pulses intact with no bruits LUE fistula thrill  Left wrist not functioning  No edema Neuro non-focal Skin warm and dry No muscular weakness  Medications Current Outpatient Prescriptions  Medication Sig Dispense Refill  . Acetaminophen 650 MG TABS Take by mouth as needed.      Marland Kitchen amitriptyline (ELAVIL) 25 MG tablet Take 25 mg by mouth at bedtime as needed for sleep.       Marland Kitchen b complex-vitamin c-folic acid (NEPHRO-VITE) 0.8 MG TABS tablet Take 0.8 mg by mouth daily.       Marland Kitchen DOXERCALCIFEROL PO Take by mouth. 7 mcg ivp 3x a week      . epoetin alfa (EPOGEN) 3000 UNIT/ML injection Inject 3,000 Units into the vein 3 (three) times a  week.      . heparin 1000 unit/mL SOLN injection by Dialysis route as needed.      Marland Kitchen ibuprofen (ADVIL,MOTRIN) 200 MG tablet Take 400 mg by mouth daily as needed for pain.      . IRON SUCROSE IV Inject into the vein. 50 mg ivp 1xweek      . lanthanum (FOSRENOL) 1000 MG chewable tablet Chew 1,000 mg by mouth 2 (two) times daily with a meal.      . Sucroferric Oxyhydroxide (VELPHORO) 500 MG CHEW Chew 1 tablet by mouth 3 (three) times daily. Take with every meal       No current facility-administered medications for this visit.    Allergies Review of patient's allergies indicates no known allergies.  Family History: Family History  Problem Relation Age of Onset  . Diabetes Mellitus II    . Hypertension    . Hypertension Mother   . Diabetes Mother   . Hypertension Father     Social History: History   Social History  . Marital Status: Married    Spouse Name: N/A    Number of Children: N/A  . Years of Education: N/A   Occupational History  . Not on file.   Social History Main Topics  . Smoking status: Never Smoker   . Smokeless tobacco: Former Neurosurgeon    Quit date: 11/28/1962  .  Alcohol Use: No  . Drug Use: No  . Sexual Activity: Not on file   Other Topics Concern  . Not on file   Social History Narrative  . No narrative on file    Electrocardiogram:  SR LAD RBBB poor R wave progression  Assessment and Plan

## 2014-01-18 NOTE — Patient Instructions (Addendum)
Your physician wants you to follow-up in:   YEAR WITH  DR NISHAN  You will receive a reminder letter in the mail two months in advance. If you don't receive a letter, please call our office to schedule the follow-up appointment. Your physician recommends that you continue on your current medications as directed. Please refer to the Current Medication list given to you today.  Your physician has requested that you have an echocardiogram. Echocardiography is a painless test that uses sound waves to create images of your heart. It provides your doctor with information about the size and shape of your heart and how well your heart's chambers and valves are working. This procedure takes approximately one hour. There are no restrictions for this procedure. Your physician has requested that you have a lexiscan myoview. For further information please visit [email or web address]. Please follow instruction sheet, as given.  

## 2014-01-18 NOTE — Assessment & Plan Note (Signed)
Well controlled.  Continue current medications and low sodium Dash type diet.    

## 2014-01-18 NOTE — Assessment & Plan Note (Signed)
F/U renal  R/O cardiac issues as cause of low BP and dypsne.  Continue epogen for anemia  F/U Imogene Burn for graft access

## 2014-01-18 NOTE — Assessment & Plan Note (Signed)
Initial episode on dialysis sounded more like orthopedic shoulder pain.  Multiple risk factors including DM and abnormal ECG F/U lexiscan myovue

## 2014-02-06 ENCOUNTER — Other Ambulatory Visit (HOSPITAL_COMMUNITY): Payer: Medicare Other

## 2014-02-06 ENCOUNTER — Encounter (HOSPITAL_COMMUNITY): Payer: Medicare Other

## 2014-02-14 ENCOUNTER — Emergency Department (HOSPITAL_COMMUNITY)
Admission: EM | Admit: 2014-02-14 | Discharge: 2014-02-14 | Disposition: A | Discharge status: Home / Self Care | Payer: Medicare Other | Source: Home / Self Care | Attending: Family Medicine | Admitting: Family Medicine

## 2014-02-14 ENCOUNTER — Encounter (HOSPITAL_COMMUNITY): Payer: Self-pay | Admitting: Emergency Medicine

## 2014-02-14 DIAGNOSIS — K089 Disorder of teeth and supporting structures, unspecified: Secondary | ICD-10-CM

## 2014-02-14 DIAGNOSIS — K029 Dental caries, unspecified: Secondary | ICD-10-CM

## 2014-02-14 DIAGNOSIS — K0889 Other specified disorders of teeth and supporting structures: Secondary | ICD-10-CM

## 2014-02-14 MED ORDER — HYDROCODONE-ACETAMINOPHEN 5-325 MG PO TABS: 1.0000 | ORAL_TABLET | Freq: Three times a day (TID) | ORAL | Status: DC | PRN

## 2014-02-14 MED ORDER — LIDOCAINE VISCOUS 2 % MT SOLN: OROMUCOSAL | Status: DC

## 2014-02-14 MED ORDER — PENICILLIN V POTASSIUM 500 MG PO TABS: 500.0000 mg | ORAL_TABLET | Freq: Three times a day (TID) | ORAL | Status: DC

## 2014-02-14 NOTE — ED Provider Notes (Signed)
Medical screening examination/treatment/procedure(s) were performed by resident physician or non-physician practitioner and as supervising physician I was immediately available for consultation/collaboration.   KINDL,JAMES DOUGLAS MD.   James D Kindl, MD 02/14/14 2149 

## 2014-02-14 NOTE — Discharge Instructions (Signed)
Dental Caries   Dental caries (also called tooth decay) is the most common oral disease. It can occur at any age, but is more common in children and young adults.   HOW DENTAL CARIES DEVELOPS   The process of decay begins when bacteria and foods (particularly sugars and starches) combine in your mouth to produce plaque. Plaque is a substance that sticks to the hard, outer surface of a tooth (enamel). The bacteria in plaque produce acids that attack enamel. These acids may also attack the root surface of a tooth (cementum) if it is exposed. Repeated attacks dissolve these surfaces and create holes in the tooth (cavities). If left untreated, the acids destroy the other layers of the tooth.   RISK FACTORS  · Frequent sipping of sugary beverages.    · Frequent snacking on sugary and starchy foods, especially those that easily get stuck in the teeth.    · Poor oral hygiene.    · Dry mouth.    · Substance abuse such as methamphetamine abuse.    · Broken or poor-fitting dental restorations.    · Eating disorders.    · Gastroesophageal reflux disease (GERD).    · Certain radiation treatments to the head and neck.  SYMPTOMS  In the early stages of dental caries, symptoms are seldom present. Sometimes white, chalky areas may be seen on the enamel or other tooth layers. In later stages, symptoms may include:  · Pits and holes on the enamel.  · Toothache after sweet, hot, or cold foods or drinks are consumed.  · Pain around the tooth.  · Swelling around the tooth.  DIAGNOSIS   Most of the time, dental caries is detected during a regular dental checkup. A diagnosis is made after a thorough medical and dental history is taken and the surfaces of your teeth are checked for signs of dental caries. Sometimes special instruments, such as lasers, are used to check for dental caries. Dental X-ray exams may be taken so that areas not visible to the eye (such as between the contact areas of the teeth) can be checked for cavities.    TREATMENT   If dental caries is in its early stages, it may be reversed with a fluoride treatment or an application of a remineralizing agent at the dental office. Thorough brushing and flossing at home is needed to aid these treatments. If it is in its later stages, treatment depends on the location and extent of tooth destruction:   · If a small area of the tooth has been destroyed, the destroyed area will be removed and cavities will be filled with a material such as gold, silver amalgam, or composite resin.    · If a large area of the tooth has been destroyed, the destroyed area will be removed and a cap (crown) will be fitted over the remaining tooth structure.    · If the center part of the tooth (pulp) is affected, a procedure called a root canal will be needed before a filling or crown can be placed.    · If most of the tooth has been destroyed, the tooth may need to be pulled (extracted).  HOME CARE INSTRUCTIONS  You can prevent, stop, or reverse dental caries at home by practicing good oral hygiene. Good oral hygiene includes:  · Thoroughly cleaning your teeth at least twice a day with a toothbrush and dental floss.    · Using a fluoride toothpaste. A fluoride mouth rinse may also be used if recommended by your dentist or health care provider.    ·   Restricting the amount of sugary and starchy foods and sugary liquids you consume.    · Avoiding frequent snacking on these foods and sipping of these liquids.    · Keeping regular visits with a dentist for checkups and cleanings.  PREVENTION   · Practice good oral hygiene.  · Consider a dental sealant. A dental sealant is a coating material that is applied by your dentist to the pits and grooves of teeth. The sealant prevents food from being trapped in them. It may protect the teeth for several years.  · Ask about fluoride supplements if you live in a community without fluorinated water or with water that has a low fluoride content. Use fluoride supplements  as directed by your dentist or health care provider.  · Allow fluoride varnish applications to teeth if directed by your dentist or health care provider.  Document Released: 07/17/2002 Document Revised: 06/27/2013 Document Reviewed: 10/27/2012  Exit[employer]® Patient Information ©2014 [employer].

## 2014-02-14 NOTE — ED Provider Notes (Signed)
CSN: [id number]     Arrival date & time 02/14/14  1854 History   First MD Initiated Contact with Patient 02/14/14 2051     No chief complaint on file.  (Consider location/radiation/quality/duration/timing/severity/associated sxs/prior Treatment) Patient is a 67 y.o. male presenting with tooth pain.  Dental Pain Location:  Lower Lower teeth location:  26/RL lateral incisor Quality:  Aching Severity:  Moderate Onset quality:  Gradual Duration:  1 day Timing:  Constant Progression:  Worsening Chronicity:  New   Past Medical History  Diagnosis Date  . Diabetes mellitus without complication   . ESRD on hemodialysis     [name], [name], ESRD due to DM/HTN, started dialysis in Sep 12, 2012  . Obesity   . Gout   . Renal failure    Past Surgical History  Procedure Laterality Date  . Insertion of dialysis catheter  09/15/2012    Procedure: INSERTION OF DIALYSIS CATHETER;  Surgeon: Chuck Hint, MD;  Location: [facility];  Service: Vascular;  Laterality: N/A;  . Menisectomy      L knee  . Av fistula placement Left    Family History  Problem Relation Age of Onset  . Diabetes Mellitus II    . Hypertension    . Hypertension Mother   . Diabetes Mother   . Hypertension Father    History  Substance Use Topics  . Smoking status: Never Smoker   . Smokeless tobacco: Former Neurosurgeon    Quit date: 11/28/1962  . Alcohol Use: No    Review of Systems  All other systems reviewed and are negative.   Allergies  Review of patient's allergies indicates no known allergies.  Home Medications   Current Outpatient Rx  Name  Route  Sig  Dispense  Refill  . Acetaminophen 650 MG TABS   Oral   Take by mouth as needed.         Marland Kitchen amitriptyline (ELAVIL) 25 MG tablet   Oral   Take 25 mg by mouth at bedtime as needed for sleep.          Marland Kitchen b complex-vitamin c-folic acid (NEPHRO-VITE) 0.8 MG TABS tablet   Oral   Take 0.8 mg by mouth daily.          Marland Kitchen DOXERCALCIFEROL PO   Oral   Take  by mouth. 7 mcg ivp 3x a week         . epoetin alfa (EPOGEN) 3000 UNIT/ML injection   Intravenous   Inject 3,000 Units into the vein 3 (three) times a week.         . heparin 1000 unit/mL SOLN injection   Dialysis   by Dialysis route as needed.         Marland Kitchen HYDROcodone-acetaminophen (NORCO/VICODIN) 5-325 MG per tablet   Oral   Take 1 tablet by mouth every 8 (eight) hours as needed for moderate pain.   15 tablet   0   . ibuprofen (ADVIL,MOTRIN) 200 MG tablet   Oral   Take 400 mg by mouth daily as needed for pain.         . IRON SUCROSE IV   Intravenous   Inject into the vein. 50 mg ivp 1xweek         . lanthanum (FOSRENOL) 1000 MG chewable tablet   Oral   Chew 1,000 mg by mouth 2 (two) times daily with a meal.         . lidocaine (XYLOCAINE) 2 % solution  Apply to affected area using cotton swab every 3 hrs as needed for pain   100 mL   0   . penicillin v potassium (VEETID) 500 MG tablet   Oral   Take 1 tablet (500 mg total) by mouth 3 (three) times daily. X 7 days   21 tablet   0   . Sucroferric Oxyhydroxide (VELPHORO) 500 MG CHEW   Oral   Chew 1 tablet by mouth 3 (three) times daily. Take with every meal          BP 103/71  Pulse 89  Temp(Src) 99 F (37.2 C) (Oral)  SpO2 95% Physical Exam  Nursing note and vitals reviewed. Constitutional: He is oriented to person, place, and time. He appears well-developed and well-nourished. No distress.  HENT:  Head: Normocephalic and atraumatic.    Right Ear: External ear normal.  Left Ear: External ear normal.  Nose: Nose normal.  Mouth/Throat: Oropharynx is clear and moist. No oral lesions. No trismus in the jaw. Dental caries present.    Eyes: Conjunctivae are normal.  Neck: Normal range of motion. Neck supple.  Cardiovascular: Normal rate.   Pulmonary/Chest: Effort normal.  Musculoskeletal: Normal range of motion.  Lymphadenopathy:    He has no cervical adenopathy.  Neurological: He is  alert and oriented to person, place, and time.  Skin: Skin is warm and dry.  Psychiatric: He has a normal mood and affect. His behavior is normal.    ED Course  Procedures (including critical care time) Labs Review Labs Reviewed - No data to display Imaging Review No results found.   MDM   1. Dental caries   2. Pain, dental   Medication as prescribed and dental follow up as soon as possible.   Jess BartersJennifer Lee Lake ProvidencePresson, [place]PA 02/14/14 2108

## 2014-02-14 NOTE — ED Notes (Addendum)
C/o pain R lower front tooth onset yesterday.  He does not it pulled.  His dentist is Dr. Neva Seat.  Hx ESRD. Last dialysis yesterday.  Fistula L upper arm

## 2014-02-21 ENCOUNTER — Encounter (HOSPITAL_COMMUNITY): Payer: Medicare Other

## 2014-02-21 ENCOUNTER — Other Ambulatory Visit (HOSPITAL_COMMUNITY): Payer: Medicare Other

## 2014-02-21 ENCOUNTER — Ambulatory Visit (HOSPITAL_COMMUNITY): Payer: Medicare Other

## 2014-02-21 ENCOUNTER — Encounter (HOSPITAL_COMMUNITY): Payer: Self-pay

## 2014-02-21 NOTE — Progress Notes (Signed)
Patient ID: Nicolas Mccann, male   DOB: [birth year], 68 y.o.   MRN: [id number] Patient was a no show today for Echo and Lexiscan Cardiolite.No answer on home phone when attempted to call the patient per Milana Na, EMT-P Notified Dr. Eden Emms and his nurse of no show.  Allison Silva,RN.

## 2014-02-21 NOTE — Progress Notes (Unsigned)
NO Show for Echocardiogram on 02/21/14.

## 2014-03-05 ENCOUNTER — Observation Stay (HOSPITAL_COMMUNITY)
Admission: EM | Admit: 2014-03-05 | Discharge: 2014-03-07 | Disposition: A | Discharge status: Home / Self Care | Payer: Medicare Other | Attending: Cardiovascular Disease | Admitting: Cardiovascular Disease

## 2014-03-05 ENCOUNTER — Emergency Department (HOSPITAL_COMMUNITY): Payer: Medicare Other

## 2014-03-05 ENCOUNTER — Encounter (HOSPITAL_COMMUNITY): Payer: Self-pay | Admitting: Emergency Medicine

## 2014-03-05 DIAGNOSIS — E119 Type 2 diabetes mellitus without complications: Secondary | ICD-10-CM | POA: Insufficient documentation

## 2014-03-05 DIAGNOSIS — R609 Edema, unspecified: Secondary | ICD-10-CM | POA: Insufficient documentation

## 2014-03-05 DIAGNOSIS — Z79899 Other long term (current) drug therapy: Secondary | ICD-10-CM | POA: Insufficient documentation

## 2014-03-05 DIAGNOSIS — E669 Obesity, unspecified: Secondary | ICD-10-CM | POA: Insufficient documentation

## 2014-03-05 DIAGNOSIS — N186 End stage renal disease: Secondary | ICD-10-CM | POA: Insufficient documentation

## 2014-03-05 DIAGNOSIS — M109 Gout, unspecified: Secondary | ICD-10-CM | POA: Insufficient documentation

## 2014-03-05 DIAGNOSIS — Z992 Dependence on renal dialysis: Secondary | ICD-10-CM | POA: Insufficient documentation

## 2014-03-05 DIAGNOSIS — N19 Unspecified kidney failure: Secondary | ICD-10-CM | POA: Insufficient documentation

## 2014-03-05 DIAGNOSIS — I120 Hypertensive chronic kidney disease with stage 5 chronic kidney disease or end stage renal disease: Secondary | ICD-10-CM | POA: Insufficient documentation

## 2014-03-05 DIAGNOSIS — M129 Arthropathy, unspecified: Secondary | ICD-10-CM | POA: Insufficient documentation

## 2014-03-05 DIAGNOSIS — R079 Chest pain, unspecified: Secondary | ICD-10-CM | POA: Diagnosis present

## 2014-03-05 DIAGNOSIS — R0789 Other chest pain: Principal | ICD-10-CM | POA: Insufficient documentation

## 2014-03-05 HISTORY — DX: Chest pain, unspecified: R07.9

## 2014-03-05 HISTORY — DX: Unspecified osteoarthritis, unspecified site: M19.90

## 2014-03-05 HISTORY — DX: Shortness of breath: R06.02

## 2014-03-05 HISTORY — DX: Essential (primary) hypertension: I10

## 2014-03-05 LAB — GLUCOSE, CAPILLARY
GLUCOSE-CAPILLARY: 167 mg/dL — AB (ref 70–99)
Glucose-Capillary: 106 mg/dL — ABNORMAL HIGH (ref 70–99)

## 2014-03-05 LAB — CBC
HEMATOCRIT: 31.9 % — AB (ref 39.0–52.0)
Hemoglobin: 10.6 g/dL — ABNORMAL LOW (ref 13.0–17.0)
MCH: 35.7 pg — ABNORMAL HIGH (ref 26.0–34.0)
MCHC: 33.2 g/dL (ref 30.0–36.0)
MCV: 107.4 fL — ABNORMAL HIGH (ref 78.0–100.0)
Platelets: 259 10*3/uL (ref 150–400)
RBC: 2.97 MIL/uL — ABNORMAL LOW (ref 4.22–5.81)
RDW: 14.3 % (ref 11.5–15.5)
WBC: 10.7 10*3/uL — AB (ref 4.0–10.5)

## 2014-03-05 LAB — I-STAT TROPONIN, ED: Troponin i, poc: 0.08 ng/mL (ref 0.00–0.08)

## 2014-03-05 LAB — BASIC METABOLIC PANEL
BUN: 43 mg/dL — AB (ref 6–23)
BUN: 45 mg/dL — ABNORMAL HIGH (ref 6–23)
CALCIUM: 8.7 mg/dL (ref 8.4–10.5)
CHLORIDE: 97 meq/L (ref 96–112)
CO2: 24 mEq/L (ref 19–32)
CO2: 23 mEq/L (ref 19–32)
Calcium: 8.7 mg/dL (ref 8.4–10.5)
Chloride: 96 mEq/L (ref 96–112)
Creatinine, Ser: 7.77 mg/dL — ABNORMAL HIGH (ref 0.50–1.35)
Creatinine, Ser: 8.16 mg/dL — ABNORMAL HIGH (ref 0.50–1.35)
GFR calc Af Amer: 7 mL/min — ABNORMAL LOW (ref >90)
GFR calc Af Amer: 7 mL/min — ABNORMAL LOW (ref >90)
GFR, EST NON AFRICAN AMERICAN: 6 mL/min — AB (ref >90)
GFR, EST NON AFRICAN AMERICAN: 6 mL/min — AB (ref >90)
GLUCOSE: 135 mg/dL — AB (ref 70–99)
Glucose, Bld: 143 mg/dL — ABNORMAL HIGH (ref 70–99)
POTASSIUM: 7.4 meq/L — AB (ref 3.7–5.3)
POTASSIUM: 4.8 meq/L (ref 3.7–5.3)
SODIUM: 137 meq/L (ref 137–147)
SODIUM: 138 meq/L (ref 137–147)

## 2014-03-05 LAB — TROPONIN I

## 2014-03-05 LAB — PRO B NATRIURETIC PEPTIDE: Pro B Natriuretic peptide (BNP): 7838 pg/mL — ABNORMAL HIGH (ref 0–125)

## 2014-03-05 MED ORDER — AMITRIPTYLINE HCL 25 MG PO TABS
25.0000 mg | ORAL_TABLET | Freq: Every evening | ORAL | Status: DC | PRN
  Filled 2014-03-05: qty 1

## 2014-03-05 MED ORDER — INSULIN ASPART 100 UNIT/ML ~~LOC~~ SOLN
0.0000 [IU] | Freq: Three times a day (TID) | SUBCUTANEOUS | Status: DC
  Administered 2014-03-06: 2 [IU] via SUBCUTANEOUS
  Administered 2014-03-07: 1 [IU] via SUBCUTANEOUS
  Administered 2014-03-07: 3 [IU] via SUBCUTANEOUS

## 2014-03-05 MED ORDER — ACETAMINOPHEN 325 MG PO TABS
650.0000 mg | ORAL_TABLET | ORAL | Status: DC | PRN
  Administered 2014-03-07: 650 mg via ORAL
  Filled 2014-03-05: qty 2

## 2014-03-05 MED ORDER — NITROGLYCERIN 0.4 MG SL SUBL: 0.4000 mg | SUBLINGUAL_TABLET | SUBLINGUAL | Status: DC | PRN

## 2014-03-05 MED ORDER — HEPARIN SODIUM (PORCINE) 5000 UNIT/ML IJ SOLN
5000.0000 [IU] | Freq: Three times a day (TID) | INTRAMUSCULAR | Status: DC
  Administered 2014-03-05 – 2014-03-07 (×4): 5000 [IU] via SUBCUTANEOUS
  Filled 2014-03-05 (×8): qty 1

## 2014-03-05 MED ORDER — METOPROLOL TARTRATE 12.5 MG HALF TABLET
12.5000 mg | ORAL_TABLET | Freq: Two times a day (BID) | ORAL | Status: DC
  Administered 2014-03-06: 12.5 mg via ORAL
  Filled 2014-03-05 (×3): qty 1

## 2014-03-05 MED ORDER — CINACALCET HCL 30 MG PO TABS: 60.0000 mg | ORAL_TABLET | Freq: Every day | ORAL | Status: DC

## 2014-03-05 MED ORDER — LANTHANUM CARBONATE 500 MG PO CHEW: 1000.0000 mg | CHEWABLE_TABLET | Freq: Two times a day (BID) | ORAL | Status: DC

## 2014-03-05 MED ORDER — SODIUM CHLORIDE 0.9 % IJ SOLN: 3.0000 mL | Freq: Two times a day (BID) | INTRAMUSCULAR | Status: DC

## 2014-03-05 MED ORDER — RENA-VITE PO TABS
1.0000 | ORAL_TABLET | Freq: Every day | ORAL | Status: DC
  Administered 2014-03-05 – 2014-03-06 (×2): 1 via ORAL
  Filled 2014-03-05 (×3): qty 1

## 2014-03-05 MED ORDER — HEPARIN BOLUS VIA INFUSION
4000.0000 [IU] | Freq: Once | INTRAVENOUS | Status: AC
  Administered 2014-03-05: 4000 [IU] via INTRAVENOUS
  Filled 2014-03-05: qty 4000

## 2014-03-05 MED ORDER — HEPARIN (PORCINE) IN NACL 100-0.45 UNIT/ML-% IJ SOLN
1300.0000 [IU]/h | INTRAMUSCULAR | Status: DC
  Administered 2014-03-05: 1300 [IU]/h via INTRAVENOUS
  Filled 2014-03-05: qty 250

## 2014-03-05 MED ORDER — SODIUM CHLORIDE 0.9 % IV SOLN: 250.0000 mL | INTRAVENOUS | Status: DC | PRN

## 2014-03-05 MED ORDER — ATORVASTATIN CALCIUM 40 MG PO TABS
40.0000 mg | ORAL_TABLET | Freq: Every day | ORAL | Status: DC
  Administered 2014-03-05 – 2014-03-06 (×2): 40 mg via ORAL
  Filled 2014-03-05 (×4): qty 1

## 2014-03-05 MED ORDER — METOPROLOL TARTRATE 12.5 MG HALF TABLET
12.5000 mg | ORAL_TABLET | Freq: Two times a day (BID) | ORAL | Status: DC
  Filled 2014-03-05: qty 1

## 2014-03-05 MED ORDER — SODIUM CHLORIDE 0.9 % IJ SOLN: 3.0000 mL | INTRAMUSCULAR | Status: DC | PRN

## 2014-03-05 MED ORDER — LANTHANUM CARBONATE 500 MG PO CHEW
1000.0000 mg | CHEWABLE_TABLET | Freq: Two times a day (BID) | ORAL | Status: DC
  Administered 2014-03-05 – 2014-03-07 (×4): 1000 mg via ORAL
  Filled 2014-03-05 (×6): qty 2

## 2014-03-05 MED ORDER — ASPIRIN EC 81 MG PO TBEC
81.0000 mg | DELAYED_RELEASE_TABLET | Freq: Every day | ORAL | Status: DC
  Administered 2014-03-06 – 2014-03-07 (×2): 81 mg via ORAL
  Filled 2014-03-05 (×3): qty 1

## 2014-03-05 MED ORDER — ASPIRIN 300 MG RE SUPP: 300.0000 mg | RECTAL | Status: AC

## 2014-03-05 MED ORDER — CINACALCET HCL 30 MG PO TABS
60.0000 mg | ORAL_TABLET | Freq: Every day | ORAL | Status: DC
  Administered 2014-03-05 – 2014-03-06 (×2): 60 mg via ORAL
  Filled 2014-03-05 (×3): qty 2

## 2014-03-05 MED ORDER — SUCROFERRIC OXYHYDROXIDE 500 MG PO CHEW
500.0000 mg | CHEWABLE_TABLET | Freq: Three times a day (TID) | ORAL | Status: DC
  Administered 2014-03-06 – 2014-03-07 (×3): 500 mg via ORAL
  Filled 2014-03-05 (×10): qty 1

## 2014-03-05 MED ORDER — ONDANSETRON HCL 4 MG/2ML IJ SOLN: 4.0000 mg | Freq: Four times a day (QID) | INTRAMUSCULAR | Status: DC | PRN

## 2014-03-05 MED ORDER — ASPIRIN 81 MG PO CHEW
324.0000 mg | CHEWABLE_TABLET | ORAL | Status: AC
  Administered 2014-03-05: 324 mg via ORAL
  Filled 2014-03-05: qty 4

## 2014-03-05 MED ORDER — ALPRAZOLAM 0.25 MG PO TABS: 0.2500 mg | ORAL_TABLET | Freq: Two times a day (BID) | ORAL | Status: DC | PRN

## 2014-03-05 NOTE — ED Notes (Signed)
Attempted to call report

## 2014-03-05 NOTE — ED Notes (Signed)
Pt c/o SHOB, and constant non-radiating generalized dull Chest Pain since last night after dialysis. Pt denies HX of Chest pain or MI

## 2014-03-05 NOTE — Progress Notes (Signed)
ANTICOAGULATION CONSULT NOTE - Initial Consult  Pharmacy Consult for Heparin  Indication: chest pain/ACS  No Known Allergies  Patient Measurements: Height: 6' 2.5" (189.2 cm) Weight: 293 lb (132.904 kg) IBW/kg (Calculated) : 83.35 Heparin Dosing Weight: 112 kg   Vital Signs: Temp: 99.4 F (37.4 C) (04/28 1119) Temp src: Oral (04/28 1119) BP: 120/73 mmHg (04/28 1445) Pulse Rate: 54 (04/28 1445)  Labs:  Recent Labs  03/05/14 1200 03/05/14 1250  HGB 10.6*  --   HCT 31.9*  --   PLT 259  --   CREATININE 7.77* 8.16*    Estimated Creatinine Clearance: 12.6 ml/min (by C-G formula based on Cr of 8.16).   Medical History: Past Medical History  Diagnosis Date  . Diabetes mellitus without complication   . ESRD on hemodialysis     [name], [name], ESRD due to DM/HTN, started dialysis in Sep 12, 2012  . Obesity   . Gout   . Renal failure     Medications:  Prescriptions prior to admission  Medication Sig Dispense Refill  . amitriptyline (ELAVIL) 25 MG tablet Take 25 mg by mouth at bedtime as needed for sleep.       Marland Kitchen b complex-vitamin c-folic acid (NEPHRO-VITE) 0.8 MG TABS tablet Take 0.8 mg by mouth daily.       . cinacalcet (SENSIPAR) 30 MG tablet Take 60 mg by mouth daily.      Marland Kitchen lanthanum (FOSRENOL) 1000 MG chewable tablet Chew 1,000 mg by mouth 2 (two) times daily with a meal.      . lidocaine (XYLOCAINE) 2 % solution Apply to affected area using cotton swab every 3 hrs as needed for pain  100 mL  0  . Sucroferric Oxyhydroxide (VELPHORO) 500 MG CHEW Chew 1 tablet by mouth 3 (three) times daily. Take with every meal        Assessment: 57 YOM who states he began having generalized chest pain last night after getting dialysis. Pharmacy to start heparin for ACS/STEMI. Initial troponin is wnl. Hgb 10.6, Plt wnl.   Goal of Therapy:  Heparin level 0.3-0.7 units/ml Monitor platelets by anticoagulation protocol: Yes   Plan:  Give 4000 units bolus x 1 Start heparin  infusion at 1300 units/hr Check anti-Xa level in 8 hours and daily while on heparin Continue to monitor H&H and platelets  Vinnie Level, PharmD.  Clinical Pharmacist Pager [phone]

## 2014-03-05 NOTE — Progress Notes (Signed)
At start of patient heparin IV infiltrated, IV normal saline and IV heparin stopped.  IV team notified for new IV site, IV team nurse assessed and unable to start IV.  I notified Dr. Algie Coffer he stated he would place order for SQ Heparin and that it was ok to leave IV out/no IV access needed at this time

## 2014-03-05 NOTE — H&P (Signed)
Referring Physician:  LUCIUS WISE is an 68 y.o. male.                       Chief Complaint: Chest pain HPI: 68 year old male with diabetes, ESRD, Obesity and gout had generalized CP last night. He had dialysis yesterday. Pt states he thinks they pulled too much fluid off. Pt denies pain at this time.    Past Medical History  Diagnosis Date  . Diabetes mellitus without complication   . ESRD on hemodialysis     [facility], [name], ESRD due to DM/HTN, started dialysis in Sep 12, 2012  . Obesity   . Gout   . Renal failure       Past Surgical History  Procedure Laterality Date  . Insertion of dialysis catheter  09/15/2012    Procedure: INSERTION OF DIALYSIS CATHETER;  Surgeon: Angelia Mould, MD;  Location: [facility] OR;  Service: Vascular;  Laterality: N/A;  . Menisectomy      L knee  . Av fistula placement Left     Family History  Problem Relation Age of Onset  . Diabetes Mellitus II    . Hypertension    . Hypertension Mother   . Diabetes Mother   . Hypertension Father    Social History:  reports that he has never smoked. He quit smokeless tobacco use about 51 years ago. He reports that he does not drink alcohol or use illicit drugs.  Allergies: No Known Allergies   (Not in a hospital admission)  Results for orders placed during the hospital encounter of 03/05/14 (from the past 48 hour(s))  CBC     Status: Abnormal   Collection Time    03/05/14 12:00 PM      Result Value Ref Range   WBC 10.7 (*) 4.0 - 10.5 K/uL   RBC 2.97 (*) 4.22 - 5.81 MIL/uL   Hemoglobin 10.6 (*) 13.0 - 17.0 g/dL   HCT 31.9 (*) 39.0 - 52.0 %   MCV 107.4 (*) 78.0 - 100.0 fL   MCH 35.7 (*) 26.0 - 34.0 pg   MCHC 33.2  30.0 - 36.0 g/dL   RDW 14.3  11.5 - 15.5 %   Platelets 259  150 - 400 K/uL  BASIC METABOLIC PANEL     Status: Abnormal   Collection Time    03/05/14 12:00 PM      Result Value Ref Range   Sodium 137  137 - 147 mEq/L   Comment: QUESTIONABLE RESULTS, RECOMMEND RECOLLECT TO VERIFY    Potassium 7.4 (*) 3.7 - 5.3 mEq/L   Comment: CRITICAL RESULT CALLED TO, READ BACK BY AND VERIFIED WITH:     G.LEWIS,RN 03/05/14 1244 BY BSLADE     QUESTIONABLE RESULTS, RECOMMEND RECOLLECT TO VERIFY     HEMOLYSIS AT THIS LEVEL MAY AFFECT RESULT   Chloride 97  96 - 112 mEq/L   Comment: QUESTIONABLE RESULTS, RECOMMEND RECOLLECT TO VERIFY   CO2 24  19 - 32 mEq/L   Comment: QUESTIONABLE RESULTS, RECOMMEND RECOLLECT TO VERIFY   Glucose, Bld 143 (*) 70 - 99 mg/dL   Comment: QUESTIONABLE RESULTS, RECOMMEND RECOLLECT TO VERIFY   BUN 43 (*) 6 - 23 mg/dL   Comment: QUESTIONABLE RESULTS, RECOMMEND RECOLLECT TO VERIFY   Creatinine, Ser 7.77 (*) 0.50 - 1.35 mg/dL   Comment: QUESTIONABLE RESULTS, RECOMMEND RECOLLECT TO VERIFY   Calcium 8.7  8.4 - 10.5 mg/dL   GFR calc non Af Amer 6 (*) >  90 mL/min   Comment: QUESTIONABLE RESULTS, RECOMMEND RECOLLECT TO VERIFY   GFR calc Af Amer 7 (*) >90 mL/min   Comment: QUESTIONABLE RESULTS, RECOMMEND RECOLLECT TO VERIFY     (NOTE)     The eGFR has been calculated using the CKD EPI equation.     This calculation has not been validated in all clinical situations.     eGFR's persistently <90 mL/min signify possible Chronic Kidney     Disease.  Randolm Idol, ED     Status: None   Collection Time    03/05/14 12:11 PM      Result Value Ref Range   Troponin i, poc 0.08  0.00 - 0.08 ng/mL   Comment 3            Comment: Due to the release kinetics of cTnI,     a negative result within the first hours     of the onset of symptoms does not rule out     myocardial infarction with certainty.     If myocardial infarction is still suspected,     repeat the test at appropriate intervals.   Dg Chest 2 View  03/05/2014   CLINICAL DATA:  Chest pain, short of breath  EXAM: CHEST  2 VIEW  COMPARISON:  DG CHEST 1 VIEW dated 10/12/2012  FINDINGS: Stable enlarged cardiac silhouette. There is new airspace disease in the right lower lobe. No pleural fluid. Degenerative  osteophytosis of the thoracic spine.  IMPRESSION: Right lobe airspace disease concerning for early pneumonia.   Electronically Signed   By: Suzy Bouchard M.D.   On: 03/05/2014 13:13    Review Of System The patient denies anorexia, fever, weight loss,, vision loss, decreased hearing, hoarseness, syncope, dyspnea on exertion, peripheral edema, balance deficits, hemoptysis, abdominal pain, melena, hematochezia, severe indigestion/heartburn, hematuria, incontinence, genital sores, muscle weakness, suspicious skin lesions, transient blindness, difficulty walking, depression, unusual weight change, abnormal bleeding, enlarged lymph nodes, angioedema, and breast masses.   Blood pressure 91/61, pulse 104, temperature 99.4 F (37.4 C), temperature source Oral, resp. rate 20, height 6' 2.5" (1.892 m), weight 132.904 kg (293 lb), SpO2 89.00%.  Physical Exam  Nursing note and vitals reviewed.  Constitutional: He appears well-developed and well-nourished. No distress.  HENT: Head: Normocephalic and atraumatic. Brown eyes, Pupils are equal, round, and reactive to light.  Neck: Normal range of motion.  Cardiovascular: Normal rate and intact distal pulses. II/VI systolic murmur. Pulmonary/Chest: No respiratory distress.  Abdominal: Normal appearance. He exhibits no distension. There is no tenderness. There is no rebound.  Musculoskeletal: Normal range of motion. He exhibits edema (1-2+ on ankles). Left forearm AV graft. Neurological: He is alert and oriented to person, place, and time. No cranial nerve deficit.  Skin: Skin is warm and dry. No rash noted.  Psychiatric: He has a normal mood and affect. His behavior is normal  Assessment/Plan Chest pain ESRD DM, II Obesity Right bundle branch block  Admit R/O MI.  Nicolas Mccann 03/05/2014, 1:34 PM

## 2014-03-05 NOTE — ED Notes (Signed)
Pt states he had dialysis yesterday.  Pt states that he began having generalized CP last night.  Pt states he thinks they pulled too much fluid off.  Pt denies pain at this time.

## 2014-03-05 NOTE — ED Provider Notes (Signed)
CSN: [id number]     Arrival date & time 03/05/14  1114 History   First MD Initiated Contact with Patient 03/05/14 1130     Chief Complaint  Patient presents with  . Chest Pain      HPI Pt states he had dialysis yesterday. Pt states that he began having generalized CP last night. Pt states he thinks they pulled too much fluid off. Pt denies pain at this time.  Past Medical History  Diagnosis Date  . Diabetes mellitus without complication   . ESRD on hemodialysis     [name], [name], ESRD due to DM/HTN, started dialysis in Sep 12, 2012  . Obesity   . Gout   . Renal failure   . Chest pain 03/05/2014  . Hypertension   . Shortness of breath   . Arthritis     ALL OVER   Past Surgical History  Procedure Laterality Date  . Insertion of dialysis catheter  09/15/2012    Procedure: INSERTION OF DIALYSIS CATHETER;  Surgeon: Chuck Hint, MD;  Location: [facility] OR;  Service: Vascular;  Laterality: N/A;  . Menisectomy      L knee  . Av fistula placement Left    Family History  Problem Relation Age of Onset  . Diabetes Mellitus II    . Hypertension    . Hypertension Mother   . Diabetes Mother   . Hypertension Father    History  Substance Use Topics  . Smoking status: Never Smoker   . Smokeless tobacco: Former Neurosurgeon    Quit date: 11/28/1962  . Alcohol Use: No    Review of Systems  All other systems reviewed and are negative   Allergies  Review of patient's allergies indicates no known allergies.  Home Medications   Prior to Admission medications   Medication Sig Start Date End Date Taking? Authorizing Provider  Acetaminophen 650 MG TABS Take by mouth as needed.    Historical Provider, MD  amitriptyline (ELAVIL) 25 MG tablet Take 25 mg by mouth at bedtime as needed for sleep.     Historical Provider, MD  b complex-vitamin c-folic acid (NEPHRO-VITE) 0.8 MG TABS tablet Take 0.8 mg by mouth daily.     Historical Provider, MD  cinacalcet (SENSIPAR) 30 MG tablet Take 60 mg by  mouth daily.    Historical Provider, MD  DOXERCALCIFEROL PO Take by mouth. 7 mcg ivp 3x a week    Historical Provider, MD  epoetin alfa (EPOGEN) 3000 UNIT/ML injection Inject 3,000 Units into the vein 3 (three) times a week.    Historical Provider, MD  heparin 1000 unit/mL SOLN injection by Dialysis route as needed.    Historical Provider, MD  HYDROcodone-acetaminophen (NORCO/VICODIN) 5-325 MG per tablet Take 1 tablet by mouth every 8 (eight) hours as needed for moderate pain. 02/14/14   Ardis Rowan, PA  ibuprofen (ADVIL,MOTRIN) 200 MG tablet Take 400 mg by mouth daily as needed for pain.    Historical Provider, MD  IRON SUCROSE IV Inject into the vein. 50 mg ivp 1xweek    Historical Provider, MD  lanthanum (FOSRENOL) 1000 MG chewable tablet Chew 1,000 mg by mouth 2 (two) times daily with a meal.    Historical Provider, MD  lidocaine (XYLOCAINE) 2 % solution Apply to affected area using cotton swab every 3 hrs as needed for pain 02/14/14   Ardis Rowan, PA  penicillin v potassium (VEETID) 500 MG tablet Take 1 tablet (500 mg total) by mouth 3 (three)  times daily. X 7 days 02/14/14   Jess Barters Presson, PA  Sucroferric Oxyhydroxide [facility]) 500 MG CHEW Chew 1 tablet by mouth 3 (three) times daily. Take with every meal    Historical Provider, MD   BP 92/65  Pulse 87  Temp(Src) 99.8 F (37.7 C) (Oral)  Resp 20  Ht 6' 2.5" (1.892 m)  Wt 291 lb 3.2 oz (132.087 kg)  BMI 36.90 kg/m2  SpO2 93% Physical Exam  Nursing note and vitals reviewed. Constitutional: He is oriented to person, place, and time. He appears well-developed and well-nourished. No distress.  HENT:  Head: Normocephalic and atraumatic.  Eyes: Pupils are equal, round, and reactive to light.  Neck: Normal range of motion.  Cardiovascular: Normal rate and intact distal pulses.   Pulmonary/Chest: No respiratory distress.  Abdominal: Normal appearance. He exhibits no distension. There is no tenderness. There is no  rebound.  Musculoskeletal: Normal range of motion. He exhibits edema (1-2+ on ankles).  Neurological: He is alert and oriented to person, place, and time. No cranial nerve deficit.  Skin: Skin is warm and dry. No rash noted.  Psychiatric: He has a normal mood and affect. His behavior is normal.    ED Course  Procedures (including critical care time) Labs Review Labs Reviewed  CBC - Abnormal; Notable for the following:    WBC 10.7 (*)    RBC 2.97 (*)    Hemoglobin 10.6 (*)    HCT 31.9 (*)    MCV 107.4 (*)    MCH 35.7 (*)    All other components within normal limits  BASIC METABOLIC PANEL - Abnormal; Notable for the following:    Potassium 7.4 (*)    Glucose, Bld 143 (*)    BUN 43 (*)    Creatinine, Ser 7.77 (*)    GFR calc non Af Amer 6 (*)    GFR calc Af Amer 7 (*)    All other components within normal limits  BASIC METABOLIC PANEL - Abnormal; Notable for the following:    Glucose, Bld 135 (*)    BUN 45 (*)    Creatinine, Ser 8.16 (*)    GFR calc non Af Amer 6 (*)    GFR calc Af Amer 7 (*)    All other components within normal limits  HEMOGLOBIN A1C - Abnormal; Notable for the following:    Hemoglobin A1C 7.1 (*)    Mean Plasma Glucose 157 (*)    All other components within normal limits  PRO B NATRIURETIC PEPTIDE - Abnormal; Notable for the following:    Pro B Natriuretic peptide (BNP) 7838.0 (*)    All other components within normal limits  GLUCOSE, CAPILLARY - Abnormal; Notable for the following:    Glucose-Capillary 106 (*)    All other components within normal limits  CBC - Abnormal; Notable for the following:    RBC 2.81 (*)    Hemoglobin 10.0 (*)    HCT 30.5 (*)    MCV 108.5 (*)    MCH 35.6 (*)    All other components within normal limits  BASIC METABOLIC PANEL - Abnormal; Notable for the following:    Glucose, Bld 152 (*)    BUN 54 (*)    Creatinine, Ser 9.16 (*)    GFR calc non Af Amer 5 (*)    GFR calc Af Amer 6 (*)    All other components within  normal limits  PROTIME-INR - Abnormal; Notable for the following:    Prothrombin  Time 16.7 (*)    All other components within normal limits  GLUCOSE, CAPILLARY - Abnormal; Notable for the following:    Glucose-Capillary 167 (*)    All other components within normal limits  GLUCOSE, CAPILLARY - Abnormal; Notable for the following:    Glucose-Capillary 153 (*)    All other components within normal limits  GLUCOSE, CAPILLARY - Abnormal; Notable for the following:    Glucose-Capillary 198 (*)    All other components within normal limits  CBC - Abnormal; Notable for the following:    RBC 2.71 (*)    Hemoglobin 9.5 (*)    HCT 29.6 (*)    MCV 109.2 (*)    MCH 35.1 (*)    All other components within normal limits  GLUCOSE, CAPILLARY - Abnormal; Notable for the following:    Glucose-Capillary 124 (*)    All other components within normal limits  GLUCOSE, CAPILLARY - Abnormal; Notable for the following:    Glucose-Capillary 205 (*)    All other components within normal limits  GLUCOSE, CAPILLARY - Abnormal; Notable for the following:    Glucose-Capillary 142 (*)    All other components within normal limits  GLUCOSE, CAPILLARY - Abnormal; Notable for the following:    Glucose-Capillary 226 (*)    All other components within normal limits  TROPONIN I  TROPONIN I  TROPONIN I  LIPID PANEL  I-STAT TROPOININ, ED    Imaging Review No results found.   EKG Interpretation   Date/Time:  Tuesday March 05 2014 11:18:07 EDT Ventricular Rate:  108 PR Interval:  192 QRS Duration: 138 QT Interval:  406 QTC Calculation: 544 R Axis:   -95 Text Interpretation:  Sinus tachycardia with Premature atrial complexes  Right bundle branch block Abnormal ECG Confirmed by Charlotta Lapaglia  MD, Jabria Loos  ([id number]) on 03/05/2014 11:32:37 AM      MDM   Final diagnoses:  Chest pain at rest        Nelia Shiobert L Jaquaya Coyle, MD 03/07/14 1346

## 2014-03-06 LAB — BASIC METABOLIC PANEL
BUN: 54 mg/dL — AB (ref 6–23)
CO2: 22 mEq/L (ref 19–32)
Calcium: 8.7 mg/dL (ref 8.4–10.5)
Chloride: 99 mEq/L (ref 96–112)
Creatinine, Ser: 9.16 mg/dL — ABNORMAL HIGH (ref 0.50–1.35)
GFR calc Af Amer: 6 mL/min — ABNORMAL LOW (ref >90)
GFR calc non Af Amer: 5 mL/min — ABNORMAL LOW (ref >90)
GLUCOSE: 152 mg/dL — AB (ref 70–99)
POTASSIUM: 4.9 meq/L (ref 3.7–5.3)
Sodium: 140 mEq/L (ref 137–147)

## 2014-03-06 LAB — TROPONIN I

## 2014-03-06 LAB — LIPID PANEL
CHOLESTEROL: 100 mg/dL (ref 0–200)
HDL: 45 mg/dL (ref >39)
LDL CALC: 42 mg/dL (ref 0–99)
TRIGLYCERIDES: 66 mg/dL (ref <150)
Total CHOL/HDL Ratio: 2.2 RATIO
VLDL: 13 mg/dL (ref 0–40)

## 2014-03-06 LAB — CBC
HEMATOCRIT: 30.5 % — AB (ref 39.0–52.0)
HEMOGLOBIN: 10 g/dL — AB (ref 13.0–17.0)
MCH: 35.6 pg — ABNORMAL HIGH (ref 26.0–34.0)
MCHC: 32.8 g/dL (ref 30.0–36.0)
MCV: 108.5 fL — ABNORMAL HIGH (ref 78.0–100.0)
Platelets: 221 10*3/uL (ref 150–400)
RBC: 2.81 MIL/uL — ABNORMAL LOW (ref 4.22–5.81)
RDW: 14 % (ref 11.5–15.5)
WBC: 8.5 10*3/uL (ref 4.0–10.5)

## 2014-03-06 LAB — HEMOGLOBIN A1C
HEMOGLOBIN A1C: 7.1 % — AB (ref <5.7)
MEAN PLASMA GLUCOSE: 157 mg/dL — AB (ref <117)

## 2014-03-06 LAB — PROTIME-INR
INR: 1.39 (ref 0.00–1.49)
Prothrombin Time: 16.7 seconds — ABNORMAL HIGH (ref 11.6–15.2)

## 2014-03-06 LAB — GLUCOSE, CAPILLARY
Glucose-Capillary: 153 mg/dL — ABNORMAL HIGH (ref 70–99)
Glucose-Capillary: 198 mg/dL — ABNORMAL HIGH (ref 70–99)
Glucose-Capillary: 124 mg/dL — ABNORMAL HIGH (ref 70–99)
Glucose-Capillary: 205 mg/dL — ABNORMAL HIGH (ref 70–99)

## 2014-03-06 MED ORDER — DARBEPOETIN ALFA-POLYSORBATE 40 MCG/0.4ML IJ SOLN
40.0000 ug | INTRAMUSCULAR | Status: DC
  Administered 2014-03-06: 40 ug via INTRAVENOUS
  Filled 2014-03-06: qty 0.4

## 2014-03-06 MED ORDER — CINACALCET HCL 30 MG PO TABS
180.0000 mg | ORAL_TABLET | Freq: Every day | ORAL | Status: DC
  Filled 2014-03-06 (×2): qty 6

## 2014-03-06 MED ORDER — METOPROLOL TARTRATE 12.5 MG HALF TABLET
12.5000 mg | ORAL_TABLET | Freq: Two times a day (BID) | ORAL | Status: DC
  Administered 2014-03-06 – 2014-03-07 (×2): 12.5 mg via ORAL
  Filled 2014-03-06 (×4): qty 1

## 2014-03-06 MED ORDER — DOXERCALCIFEROL 4 MCG/2ML IV SOLN
INTRAVENOUS | Status: AC
Start: 2014-03-06 — End: 2014-03-06
  Administered 2014-03-06: 7 ug via INTRAVENOUS
  Filled 2014-03-06: qty 4

## 2014-03-06 MED ORDER — ISOSORBIDE MONONITRATE 15 MG HALF TABLET
15.0000 mg | ORAL_TABLET | Freq: Every day | ORAL | Status: DC
  Administered 2014-03-06 – 2014-03-07 (×2): 15 mg via ORAL
  Filled 2014-03-06 (×2): qty 1

## 2014-03-06 MED ORDER — DOXERCALCIFEROL 4 MCG/2ML IV SOLN
7.0000 ug | INTRAVENOUS | Status: DC
  Administered 2014-03-06: 7 ug via INTRAVENOUS
  Filled 2014-03-06: qty 4

## 2014-03-06 MED ORDER — DARBEPOETIN ALFA-POLYSORBATE 40 MCG/0.4ML IJ SOLN
INTRAMUSCULAR | Status: AC
  Administered 2014-03-06: 40 ug via INTRAVENOUS
  Filled 2014-03-06: qty 0.4

## 2014-03-06 MED ORDER — AMLODIPINE BESYLATE 2.5 MG PO TABS
2.5000 mg | ORAL_TABLET | Freq: Every day | ORAL | Status: DC
  Filled 2014-03-06: qty 1

## 2014-03-06 NOTE — Progress Notes (Signed)
Utilization review completed.  

## 2014-03-06 NOTE — Progress Notes (Signed)
Ref: HANDE,VISHWANATH, MD   Subjective:  No chest pain today. No IV access. T max 99.9 F  Objective:  Vital Signs in the last 24 hours: Temp:  [98.6 F (37 C)-99.9 F (37.7 C)] 98.6 F (37 C) (04/29 0502) Pulse Rate:  [54-109] 97 (04/29 0502) Cardiac Rhythm:  [-] Sinus tachycardia (04/28 1955) Resp:  [16-30] 20 (04/28 1552) BP: (91-131)/(44-76) 110/70 mmHg (04/29 0502) SpO2:  [89 %-96 %] 94 % (04/29 0502) Weight:  [131.997 kg (291 lb)-133.403 kg (294 lb 1.6 oz)] 133.403 kg (294 lb 1.6 oz) (04/29 8588)  Physical Exam: BP Readings from Last 1 Encounters:  03/06/14 110/70    Wt Readings from Last 1 Encounters:  03/06/14 133.403 kg (294 lb 1.6 oz)    Weight change:   HEENT: [place]/AT, Eyes-Brown, PERL, EOMI, Conjunctiva-Pale pink, Sclera-Non-icteric Neck: No JVD, No bruit, Trachea midline. Lungs:  Clear, Bilateral. Cardiac:  Regular rhythm, normal S1 and S2, no S3. II/VI systolic murmur. Abdomen:  Soft, non-tender. Extremities:  No edema present. No cyanosis. No clubbing. Left forearm AV graft. CNS: AxOx3, Cranial nerves grossly intact, moves all 4 extremities. Right handed. Skin: Warm and dry.   Intake/Output from previous day:      Lab Results: BMET    Component Value Date/Time   NA 140 03/06/2014 0056   NA 138 03/05/2014 1250   NA 137 03/05/2014 1200   K 4.9 03/06/2014 0056   K 4.8 03/05/2014 1250   K 7.4* 03/05/2014 1200   CL 99 03/06/2014 0056   CL 96 03/05/2014 1250   CL 97 03/05/2014 1200   CO2 22 03/06/2014 0056   CO2 23 03/05/2014 1250   CO2 24 03/05/2014 1200   GLUCOSE 152* 03/06/2014 0056   GLUCOSE 135* 03/05/2014 1250   GLUCOSE 143* 03/05/2014 1200   BUN 54* 03/06/2014 0056   BUN 45* 03/05/2014 1250   BUN 43* 03/05/2014 1200   CREATININE 9.16* 03/06/2014 0056   CREATININE 8.16* 03/05/2014 1250   CREATININE 7.77* 03/05/2014 1200   CALCIUM 8.7 03/06/2014 0056   CALCIUM 8.7 03/05/2014 1250   CALCIUM 8.7 03/05/2014 1200   CALCIUM 9.2 01/13/2011 2249   CALCIUM 9.5 04/30/2010  2240   GFRNONAA 5* 03/06/2014 0056   GFRNONAA 6* 03/05/2014 1250   GFRNONAA 6* 03/05/2014 1200   GFRAA 6* 03/06/2014 0056   GFRAA 7* 03/05/2014 1250   GFRAA 7* 03/05/2014 1200   CBC    Component Value Date/Time   WBC 8.5 03/06/2014 0056   RBC 2.81* 03/06/2014 0056   RBC 2.55* 09/12/2012 1754   HGB 10.0* 03/06/2014 0056   HCT 30.5* 03/06/2014 0056   PLT 221 03/06/2014 0056   MCV 108.5* 03/06/2014 0056   MCH 35.6* 03/06/2014 0056   MCHC 32.8 03/06/2014 0056   RDW 14.0 03/06/2014 0056   LYMPHSABS 1.1 10/11/2012 1956   MONOABS 0.8 10/11/2012 1956   EOSABS 0.1 10/11/2012 1956   BASOSABS 0.0 10/11/2012 1956   HEPATIC Function Panel No results found for this basename: PROT,  ALBUMIN,  AST,  ALT,  ALKPHOS,  BILIDIR,  IBILI,  in the last 8760 hours HEMOGLOBIN A1C No components found with this basename: HGA1C,  MPG   CARDIAC ENZYMES Lab Results  Component Value Date   TROPONINI <0.30 03/06/2014   TROPONINI <0.30 03/06/2014   TROPONINI <0.30 03/05/2014   BNP  Recent Labs  03/05/14 1853  PROBNP 7838.0*   TSH No results found for this basename: TSH,  in the last 8760 hours CHOLESTEROL  Recent Labs  03/06/14 0056  CHOL 100    Scheduled Meds: . aspirin EC  81 mg Oral Daily  . atorvastatin  40 mg Oral q1800  . cinacalcet  60 mg Oral Q breakfast  . heparin subcutaneous  5,000 Units Subcutaneous 3 times per day  . insulin aspart  0-9 Units Subcutaneous TID WC  . isosorbide mononitrate  15 mg Oral Daily  . lanthanum  1,000 mg Oral BID WC  . metoprolol tartrate  12.5 mg Oral BID  . multivitamin  1 tablet Oral QHS  . sucroferric oxyhydroxide  500 mg Oral TID WC   Continuous Infusions:  PRN Meds:.acetaminophen, ALPRAZolam, amitriptyline, nitroGLYCERIN, ondansetron (ZOFRAN) IV  Assessment/Plan: Chest pain  ESRD  DM, II  Obesity  Right bundle branch block  Imdur 15 mg. Daily. Consider nuclear stress test in AM.   LOS: 1 day    Orpah CobbAjay Jamielee Mchale  MD  03/06/2014, 9:01 AM

## 2014-03-06 NOTE — Procedures (Signed)
I was present at this session.  I have reviewed the session itself and made appropriate changes.  HD via LUA avf bp low 100s.  Access press ok.   Llana Aliment Shella Lahman 4/29/20151:36 PM

## 2014-03-06 NOTE — Consult Note (Signed)
[facility] Renal Consultation Note    Indication for Consultation:  Management of ESRD/hemodialysis; anemia, hypertension/volume and secondary hyperparathyroidism  HPI: Nicolas Mccann is a 68 y.o. male ESRD patient (MWF HD) with past medical history significant for DM and hypertension who presented to the ED last evening with complaints of chest pain following dialysis. He states " I just think they took too much fluid off." He endorses some mild shortness of breath today but  has no other complaints. He denies further chest pain and denies having these symptoms after dialysis prior to this episode. Not clear did anything diff over week end and fluid removal at dialysis only 2 liters.  Past Medical History  Diagnosis Date  . Diabetes mellitus without complication   . ESRD on hemodialysis     [name], [name], ESRD due to DM/HTN, started dialysis in Sep 12, 2012  . Obesity   . Gout   . Renal failure   . Chest pain 03/05/2014  . Hypertension   . Shortness of breath   . Arthritis     ALL OVER   Past Surgical History  Procedure Laterality Date  . Insertion of dialysis catheter  09/15/2012    Procedure: INSERTION OF DIALYSIS CATHETER;  Surgeon: Chuck Hint, MD;  Location: [facility] OR;  Service: Vascular;  Laterality: N/A;  . Menisectomy      L knee  . Av fistula placement Left    Family History  Problem Relation Age of Onset  . Diabetes Mellitus II    . Hypertension    . Hypertension Mother   . Diabetes Mother   . Hypertension Father    Social History:  reports that he has never smoked. He quit smokeless tobacco use about 51 years ago. He reports that he does not drink alcohol or use illicit drugs. No Known Allergies Prior to Admission medications   Medication Sig Start Date End Date Taking? Authorizing Provider  amitriptyline (ELAVIL) 25 MG tablet Take 25 mg by mouth at bedtime as needed for sleep.    Yes Historical Provider, MD  b complex-vitamin c-folic acid  (NEPHRO-VITE) 0.8 MG TABS tablet Take 0.8 mg by mouth daily.    Yes Historical Provider, MD  cinacalcet (SENSIPAR) 30 MG tablet Take 60 mg by mouth daily.   Yes Historical Provider, MD  lanthanum (FOSRENOL) 1000 MG chewable tablet Chew 1,000 mg by mouth 2 (two) times daily with a meal.   Yes Historical Provider, MD  lidocaine (XYLOCAINE) 2 % solution Apply to affected area using cotton swab every 3 hrs as needed for pain 02/14/14  Yes Jess Barters Presson, PA  Sucroferric Oxyhydroxide (VELPHORO) 500 MG CHEW Chew 1 tablet by mouth 3 (three) times daily. Take with every meal   Yes Historical Provider, MD   Current Facility-Administered Medications  Medication Dose Route Frequency Provider Last Rate Last Dose  . acetaminophen (TYLENOL) tablet 650 mg  650 mg Oral Q4H PRN Ricki Rodriguez, MD      . ALPRAZolam Prudy Feeler) tablet 0.25 mg  0.25 mg Oral BID PRN Ricki Rodriguez, MD      . amitriptyline (ELAVIL) tablet 25 mg  25 mg Oral QHS PRN Ricki Rodriguez, MD      . aspirin EC tablet 81 mg  81 mg Oral Daily Ricki Rodriguez, MD   81 mg at 03/06/14 0953  . atorvastatin (LIPITOR) tablet 40 mg  40 mg Oral q1800 Ricki Rodriguez, MD   40 mg at  03/05/14 1725  . cinacalcet (SENSIPAR) tablet 60 mg  60 mg Oral Q breakfast Ricki Rodriguez, MD   60 mg at 03/06/14 0821  . heparin injection 5,000 Units  5,000 Units Subcutaneous 3 times per day Ricki Rodriguez, MD   5,000 Units at 03/06/14 0640  . insulin aspart (novoLOG) injection 0-9 Units  0-9 Units Subcutaneous TID WC Ricki Rodriguez, MD   2 Units at 03/06/14 0820  . isosorbide mononitrate (IMDUR) 24 hr tablet 15 mg  15 mg Oral Daily Ricki Rodriguez, MD      . lanthanum [facility]) chewable tablet 1,000 mg  1,000 mg Oral BID WC Ricki Rodriguez, MD   1,000 mg at 03/06/14 5747  . metoprolol tartrate (LOPRESSOR) tablet 12.5 mg  12.5 mg Oral BID Ricki Rodriguez, MD   12.5 mg at 03/06/14 0953  . multivitamin (RENA-VIT) tablet 1 tablet  1 tablet Oral QHS Ricki Rodriguez, MD   1 tablet at  03/05/14 2126  . nitroGLYCERIN (NITROSTAT) SL tablet 0.4 mg  0.4 mg Sublingual Q5 Min x 3 PRN Ricki Rodriguez, MD      . ondansetron [facility]) injection 4 mg  4 mg Intravenous Q6H PRN Ricki Rodriguez, MD      . sucroferric oxyhydroxide (VELPHORO) chewable tablet 500 mg  500 mg Oral TID WC Ricki Rodriguez, MD   500 mg at 03/06/14 3403   Labs: Basic Metabolic Panel:  Recent Labs Lab 03/05/14 1200 03/05/14 1250 03/06/14 0056  NA 137 138 140  K 7.4* 4.8 4.9  CL 97 96 99  CO2 24 23 22   GLUCOSE 143* 135* 152*  BUN 43* 45* 54*  CREATININE 7.77* 8.16* 9.16*  CALCIUM 8.7 8.7 8.7   CBC:  Recent Labs Lab 03/05/14 1200 03/06/14 0056  WBC 10.7* 8.5  HGB 10.6* 10.0*  HCT 31.9* 30.5*  MCV 107.4* 108.5*  PLT 259 221   Cardiac Enzymes:  Recent Labs Lab 03/05/14 1853 03/06/14 0056 03/06/14 0645  TROPONINI <0.30 <0.30 <0.30   CBG:  Recent Labs Lab 03/05/14 1631 03/05/14 1956 03/06/14 0743  GLUCAP 106* 167* 153*   Dg Chest 2 View  03/05/2014   CLINICAL DATA:  Chest pain, short of breath  EXAM: CHEST  2 VIEW  COMPARISON:  DG CHEST 1 VIEW dated 10/12/2012  FINDINGS: Stable enlarged cardiac silhouette. There is new airspace disease in the right lower lobe. No pleural fluid. Degenerative osteophytosis of the thoracic spine.  IMPRESSION: Right lobe airspace disease concerning for early pneumonia.   Electronically Signed   By: [name] M.D.   On: 03/05/2014 13:13    ROS: Mild shortness of breath. 10 pt ROS asked and answered. All systems negative except as above.   Physical Exam: Filed Vitals:   03/05/14 1951 03/05/14 2125 03/06/14 0502 03/06/14 0642  BP: 92/71 97/49 110/70   Pulse: 83  97   Temp: 99.6 F (37.6 C)  98.6 F (37 C)   TempSrc: Oral  Oral   Resp:      Height:      Weight:    133.403 kg (294 lb 1.6 oz)  SpO2: 93%  94%      General: Well developed, well nourished, in no acute distress. Mod obesity Head: Normocephalic, atraumatic, sclera non-icteric, mucus  membranes are moist Fundi benign,poor lower teeth, only a few present. Neck: Supple. JVD not elevated.PCL Lungs: Clear bilaterally to auscultation without wheezes, rales, or rhonchi. Breathing is unlabored.decreased bs and  expansion Heart: RRR. 1/6 murmurs. No rubs or gallops appreciated. Only bruit in avf Abdomen: Obese, non-tender, non-distended with normoactive bowel sounds. No rebound/guarding. No obvious abdominal masses. Liver down 4 cm M-S:  Strength and tone appear normal for age. Lower extremities:without edema , no open wounds. Dry cracked skin  Neuro: Alert and oriented X 3. Moves all extremities spontaneously. Psych:  Responds to questions appropriately with a normal affect. Dialysis Access: L AVF + bruit  Dialysis Orders: [facility] . EDW 133.5 kg HD Bath 2K/2Ca  Time 4:15 Heparin [id number]. Access L AVF BFR 450 DFR 800    Hectorol 7 mcg IV/HD Aranesp 25 q Wed   Units IV/HD  Venofer 0  Recent  Labs Hgb 10.6, Tsat 31%, Phos 5.2, PTH 417  Assessment/Plan: 1. Chest pain/ RBBB - Mgmt per Cardiology. Now on Imdur. Possible nuclear stress test in the am High risk for CAD 2. ESRD -  MWF K+4.9, plan HD today Not clear dialysis contrib to this,mod risk.  Has low bp usually 3. Hypertension/volume  - Soft SBPs. Prev off all BP meds op. Now on Lopressor 12.5 BID per primary. Unclear if he will be able to tolerate. Will hold am dose on HD days for now. Some mild vasc congestion on CXR. UF as tolerated. Currently at his edw. 4. Anemia  - Hgb 10. Aranesp 40 q Wed ordered. Last Tsat 31%, No IV Fe for now 5. Metabolic bone disease -  Ca/Phos controlled. Continue binders and Vit D. Incr Sensipar to home dose 180 mg qhs. 6. Nutrition - Renal diet. Multivitamin 7. DM II - on SSI per primary 8. Obesity  Claud KelpKaren Warren, PA-C [facility] Pager [phone] 03/06/2014, 10:39 AM I have seen and examined this patient and agree with the plan of care seen, examined, eval and counseled.    Llana Aliment.  Greggory Safranek L Apryl Brymer 03/06/2014, 1:30 PM

## 2014-03-07 LAB — GLUCOSE, CAPILLARY
GLUCOSE-CAPILLARY: 142 mg/dL — AB (ref 70–99)
Glucose-Capillary: 226 mg/dL — ABNORMAL HIGH (ref 70–99)

## 2014-03-07 LAB — CBC
HCT: 29.6 % — ABNORMAL LOW (ref 39.0–52.0)
HEMOGLOBIN: 9.5 g/dL — AB (ref 13.0–17.0)
MCH: 35.1 pg — ABNORMAL HIGH (ref 26.0–34.0)
MCHC: 32.1 g/dL (ref 30.0–36.0)
MCV: 109.2 fL — AB (ref 78.0–100.0)
Platelets: 221 10*3/uL (ref 150–400)
RBC: 2.71 MIL/uL — AB (ref 4.22–5.81)
RDW: 14 % (ref 11.5–15.5)
WBC: 8 10*3/uL (ref 4.0–10.5)

## 2014-03-07 MED ORDER — RENA-VITE PO TABS: 1.0000 | ORAL_TABLET | Freq: Every day | ORAL | Status: DC

## 2014-03-07 MED ORDER — METOPROLOL TARTRATE 12.5 MG HALF TABLET: 12.5000 mg | ORAL_TABLET | Freq: Two times a day (BID) | ORAL | Status: DC

## 2014-03-07 MED ORDER — ASPIRIN 81 MG PO TBEC: 81.0000 mg | DELAYED_RELEASE_TABLET | Freq: Every day | ORAL | Status: DC

## 2014-03-07 MED ORDER — ATORVASTATIN CALCIUM 40 MG PO TABS: 40.0000 mg | ORAL_TABLET | Freq: Every day | ORAL | Status: AC

## 2014-03-07 NOTE — Progress Notes (Signed)
UR Completed.  Nicolas Mccann [phone] 03/07/2014  

## 2014-03-07 NOTE — Progress Notes (Signed)
Subjective: Interval History: has complaints HA this am.  Objective: Vital signs in last 24 hours: Temp:  [98.2 F (36.8 C)-99.8 F (37.7 C)] 99.8 F (37.7 C) (04/30 0534) Pulse Rate:  [77-94] 87 (04/30 0534) Resp:  [16-23] 20 (04/30 0534) BP: (87-126)/(56-96) 92/65 mmHg (04/30 0534) SpO2:  [90 %-93 %] 93 % (04/30 0534) Weight:  [132.087 kg (291 lb 3.2 oz)-133.7 kg (294 lb 12.1 oz)] 132.087 kg (291 lb 3.2 oz) (04/30 0534) Weight change: 0.796 kg (1 lb 12.1 oz)  Intake/Output from previous day: 04/29 0701 - 04/30 0700 In: -  Out: 1000  Intake/Output this shift:    General appearance: alert, cooperative and morbidly obese Resp: diminished breath sounds bilaterally and rales bibasilar Cardio: S1, S2 normal and systolic murmur: holosystolic 2/6, blowing at apex GI: obese, pos bs, soft Extremities: AVF LUA  Lab Results:  Recent Labs  03/06/14 0056 03/07/14 0615  WBC 8.5 8.0  HGB 10.0* 9.5*  HCT 30.5* 29.6*  PLT 221 221   BMET:  Recent Labs  03/05/14 1250 03/06/14 0056  NA 138 140  K 4.8 4.9  CL 96 99  CO2 23 22  GLUCOSE 135* 152*  BUN 45* 54*  CREATININE 8.16* 9.16*  CALCIUM 8.7 8.7   No results found for this basename: PTH,  in the last 72 hours Iron Studies: No results found for this basename: IRON, TIBC, TRANSFERRIN, FERRITIN,  in the last 72 hours  Studies/Results: Dg Chest 2 View  03/05/2014   CLINICAL DATA:  Chest pain, short of breath  EXAM: CHEST  2 VIEW  COMPARISON:  DG CHEST 1 VIEW dated 10/12/2012  FINDINGS: Stable enlarged cardiac silhouette. There is new airspace disease in the right lower lobe. No pleural fluid. Degenerative osteophytosis of the thoracic spine.  IMPRESSION: Right lobe airspace disease concerning for early pneumonia.   Electronically Signed   By: [name] M.D.   On: 03/05/2014 13:13    I have reviewed the patient's current medications.  Assessment/Plan: 1 CRF for HD MWF. ? If will be here tomorrow 2 CP per cards 3  Obesity 4 Anemia 5 DM controlled 6 HPTH vit D P HD if here , otherwise @ [facility]    LOS: 2 days   Nicolas Mccann L Nicolas Mccann 03/07/2014,7:52 AM

## 2014-03-07 NOTE — Progress Notes (Signed)
Discharge instructions and prescriptions given.  No questions asked.  Left via wheelchair to family member waiting.  Donn Pierini

## 2014-03-07 NOTE — Progress Notes (Signed)
  Echocardiogram 2D Echocardiogram has been performed.  Nicolas Mccann 03/07/2014, 10:00 AM

## 2014-03-07 NOTE — Discharge Summary (Signed)
Physician Discharge Summary  Patient ID: Nicolas Mccann MRN: [id number] DOB/AGE: [birth year] 68 y.o.  Admit date: 03/05/2014 Discharge date: 03/07/2014  Admission Diagnoses: Chest pain  ESRD  DM, II  Obesity  Right bundle branch block  Discharge Diagnoses:  Principle Problem: * Chest pain at rest * Possible ischemic cardiomyopathy  ESRD  DM, II  Obesity  Right bundle branch block  Discharged Condition: fair  Hospital Course: 68 year old male with diabetes, ESRD, Obesity and gout had generalized chest pain on night before admission. He had dialysis yesterday. Pt states he thinks they pulled too much fluid off. Pt denies pain in ER and post admission. His cardiac enzymes were normal. He had nephrology consult. He was offered medical treatment, nuclear stress test or cardiac catheterization. He wants to try medical treatment first, then he will tell me about additional work-up as he is convinced that they pulled too much fluid off him at dialysis center that started the chest pain. He was also advised to discuss this issue with his nephrologist. He was given low dose of long acting isosorbide mononitrate.    Consults: cardiology and nephrology  Significant Diagnostic Studies: labs: Mild anemia otherwise unremarkable CBC. Elevated BUN, Creatinine otherwise unremarkable. Potassium was falsely high. Troponin-I negative x 3.  EKG-SR, RBBB, LAHB.  Echocardiogram: - Left ventricle: The cavity size was mildly dilated. There was mild concentric hypertrophy. Systolic function was moderately reduced. The estimated ejection fraction was in the range of 35% to 40%. There is mild hypokinesis of the entire myocardium. - Mitral valve: Calcified annulus. Mild regurgitation. - Left atrium: The atrium was mildly dilated. - Right ventricle: Systolic function was mildly reduced.   Treatments: cardiac meds: metoprolol and Imdur  Discharge Exam: Blood pressure 88/63, pulse 92, temperature 98.4 F (36.9  C), temperature source Oral, resp. rate 20, height 6' 2.5" (1.892 m), weight 132.087 kg (291 lb 3.2 oz), SpO2 100.00%. HEENT: [place]/AT, Eyes-Brown, PERL, EOMI, Conjunctiva-Pale pink, Sclera-Non-icteric  Neck: No JVD, No bruit, Trachea midline.  Lungs: Clear, Bilateral.  Cardiac: Regular rhythm, normal S1 and S2, no S3. II/VI systolic murmur.  Abdomen: Soft, non-tender.  Extremities: No edema present. No cyanosis. No clubbing. Left forearm AV graft.  CNS: AxOx3, Cranial nerves grossly intact, moves all 4 extremities. Right handed.  Skin: Warm and dry.   Disposition: 01-Home or Self Care     Medication List    TAKE these medications       amitriptyline 25 MG tablet  Commonly known as:  ELAVIL  Take 25 mg by mouth at bedtime as needed for sleep.     aspirin 81 MG EC tablet  Take 1 tablet (81 mg total) by mouth daily.     atorvastatin 40 MG tablet  Commonly known as:  LIPITOR  Take 1 tablet (40 mg total) by mouth daily at 6 PM.     multivitamin Tabs tablet  Take 1 tablet by mouth at bedtime.     b complex-vitamin c-folic acid 0.8 MG Tabs tablet  Take 0.8 mg by mouth daily.     cinacalcet 30 MG tablet  Commonly known as:  SENSIPAR  Take 60 mg by mouth daily.     metoprolol tartrate 12.5 mg Tabs tablet  Commonly known as:  LOPRESSOR  Take 0.5 tablets (12.5 mg total) by mouth 2 (two) times daily.     VELPHORO 500 MG chewable tablet  Generic drug:  sucroferric oxyhydroxide  Chew 1 tablet by mouth 3 (three) times daily. Take  with every meal      ASK your doctor about these medications       lanthanum 1000 MG chewable tablet  Commonly known as:  FOSRENOL  Chew 1,000 mg by mouth 2 (two) times daily with a meal.     lidocaine 2 % solution  Commonly known as:  XYLOCAINE  Apply to affected area using cotton swab every 3 hrs as needed for pain           Follow-up Information   [facility] 2 weeks.   Specialty:  Internal Medicine   Contact  information:   [facility] [address] [place] [address] [phone]       [facility] Me[facility]es S, MD. Schedule an appointment as soon as possible for a visit in 1 week.   Specialty:  Cardiology   Contact information:   [address] [place] [address] [phone]       Signed: Ricki Rodriguezjay S Jazline Cumbee 03/07/2014, 5:41 PM

## 2014-05-17 ENCOUNTER — Other Ambulatory Visit (HOSPITAL_COMMUNITY): Payer: Self-pay | Admitting: Cardiovascular Disease

## 2014-05-17 DIAGNOSIS — R0789 Other chest pain: Secondary | ICD-10-CM

## 2014-06-13 ENCOUNTER — Encounter (HOSPITAL_COMMUNITY): Payer: Medicare Other

## 2014-06-13 ENCOUNTER — Encounter (HOSPITAL_COMMUNITY)
Admission: RE | Admit: 2014-06-13 | Discharge: 2014-06-13 | Disposition: A | Discharge status: Home / Self Care | Payer: Medicare Other | Source: Ambulatory Visit | Attending: Cardiovascular Disease | Admitting: Cardiovascular Disease

## 2014-06-13 ENCOUNTER — Other Ambulatory Visit: Payer: Self-pay

## 2014-06-13 DIAGNOSIS — R0789 Other chest pain: Secondary | ICD-10-CM | POA: Insufficient documentation

## 2014-06-13 MED ORDER — REGADENOSON 0.4 MG/5ML IV SOLN
0.4000 mg | Freq: Once | INTRAVENOUS | Status: AC
  Administered 2014-06-13: 0.4 mg via INTRAVENOUS

## 2014-06-13 MED ORDER — TECHNETIUM TC 99M SESTAMIBI GENERIC - CARDIOLITE: 10.0000 | Freq: Once | INTRAVENOUS | Status: DC | PRN

## 2014-06-13 MED ORDER — TECHNETIUM TC 99M SESTAMIBI GENERIC - CARDIOLITE
10.0000 | Freq: Once | INTRAVENOUS | Status: AC | PRN
Start: 2014-06-13 — End: 2014-06-13
  Administered 2014-06-13: 10 via INTRAVENOUS

## 2014-06-13 MED ORDER — TECHNETIUM TC 99M SESTAMIBI GENERIC - CARDIOLITE
30.0000 | Freq: Once | INTRAVENOUS | Status: AC | PRN
  Administered 2014-06-13: 30 via INTRAVENOUS

## 2014-06-13 NOTE — Progress Notes (Signed)
Dr Kadakia at bedside 1 min after Lexi. 

## 2014-06-14 ENCOUNTER — Other Ambulatory Visit (HOSPITAL_COMMUNITY): Payer: Medicare Other

## 2014-07-23 ENCOUNTER — Observation Stay (HOSPITAL_COMMUNITY)
Admission: EM | Admit: 2014-07-23 | Discharge: 2014-07-26 | Disposition: A | Discharge status: Home / Self Care | Payer: Medicare Other | Attending: Emergency Medicine | Admitting: Emergency Medicine

## 2014-07-23 ENCOUNTER — Emergency Department (HOSPITAL_COMMUNITY): Payer: Medicare Other

## 2014-07-23 ENCOUNTER — Encounter (HOSPITAL_COMMUNITY): Payer: Self-pay | Admitting: Emergency Medicine

## 2014-07-23 DIAGNOSIS — E669 Obesity, unspecified: Secondary | ICD-10-CM | POA: Diagnosis not present

## 2014-07-23 DIAGNOSIS — D631 Anemia in chronic kidney disease: Secondary | ICD-10-CM | POA: Diagnosis not present

## 2014-07-23 DIAGNOSIS — R404 Transient alteration of awareness: Secondary | ICD-10-CM | POA: Diagnosis present

## 2014-07-23 DIAGNOSIS — Z23 Encounter for immunization: Secondary | ICD-10-CM | POA: Diagnosis not present

## 2014-07-23 DIAGNOSIS — R55 Syncope and collapse: Secondary | ICD-10-CM | POA: Diagnosis not present

## 2014-07-23 DIAGNOSIS — E119 Type 2 diabetes mellitus without complications: Secondary | ICD-10-CM | POA: Insufficient documentation

## 2014-07-23 DIAGNOSIS — E1129 Type 2 diabetes mellitus with other diabetic kidney complication: Secondary | ICD-10-CM | POA: Diagnosis present

## 2014-07-23 DIAGNOSIS — R0989 Other specified symptoms and signs involving the circulatory and respiratory systems: Secondary | ICD-10-CM | POA: Diagnosis not present

## 2014-07-23 DIAGNOSIS — Y841 Kidney dialysis as the cause of abnormal reaction of the patient, or of later complication, without mention of misadventure at the time of the procedure: Secondary | ICD-10-CM | POA: Insufficient documentation

## 2014-07-23 DIAGNOSIS — I120 Hypertensive chronic kidney disease with stage 5 chronic kidney disease or end stage renal disease: Secondary | ICD-10-CM | POA: Insufficient documentation

## 2014-07-23 DIAGNOSIS — J9600 Acute respiratory failure, unspecified whether with hypoxia or hypercapnia: Secondary | ICD-10-CM | POA: Insufficient documentation

## 2014-07-23 DIAGNOSIS — I10 Essential (primary) hypertension: Secondary | ICD-10-CM | POA: Diagnosis present

## 2014-07-23 DIAGNOSIS — N039 Chronic nephritic syndrome with unspecified morphologic changes: Principal | ICD-10-CM

## 2014-07-23 DIAGNOSIS — R0600 Dyspnea, unspecified: Secondary | ICD-10-CM

## 2014-07-23 DIAGNOSIS — T82598A Other mechanical complication of other cardiac and vascular devices and implants, initial encounter: Secondary | ICD-10-CM | POA: Insufficient documentation

## 2014-07-23 DIAGNOSIS — R0609 Other forms of dyspnea: Secondary | ICD-10-CM | POA: Insufficient documentation

## 2014-07-23 DIAGNOSIS — Z992 Dependence on renal dialysis: Secondary | ICD-10-CM

## 2014-07-23 DIAGNOSIS — I428 Other cardiomyopathies: Secondary | ICD-10-CM | POA: Diagnosis not present

## 2014-07-23 DIAGNOSIS — M109 Gout, unspecified: Secondary | ICD-10-CM | POA: Diagnosis not present

## 2014-07-23 DIAGNOSIS — D72829 Elevated white blood cell count, unspecified: Secondary | ICD-10-CM | POA: Diagnosis not present

## 2014-07-23 DIAGNOSIS — Z79899 Other long term (current) drug therapy: Secondary | ICD-10-CM | POA: Insufficient documentation

## 2014-07-23 DIAGNOSIS — I951 Orthostatic hypotension: Secondary | ICD-10-CM | POA: Diagnosis not present

## 2014-07-23 DIAGNOSIS — I9589 Other hypotension: Secondary | ICD-10-CM

## 2014-07-23 DIAGNOSIS — N186 End stage renal disease: Secondary | ICD-10-CM

## 2014-07-23 DIAGNOSIS — R0902 Hypoxemia: Secondary | ICD-10-CM | POA: Insufficient documentation

## 2014-07-23 DIAGNOSIS — I420 Dilated cardiomyopathy: Secondary | ICD-10-CM | POA: Diagnosis present

## 2014-07-23 DIAGNOSIS — N189 Chronic kidney disease, unspecified: Secondary | ICD-10-CM

## 2014-07-23 DIAGNOSIS — N185 Chronic kidney disease, stage 5: Secondary | ICD-10-CM

## 2014-07-23 DIAGNOSIS — M129 Arthropathy, unspecified: Secondary | ICD-10-CM | POA: Diagnosis not present

## 2014-07-23 DIAGNOSIS — J9601 Acute respiratory failure with hypoxia: Secondary | ICD-10-CM

## 2014-07-23 LAB — I-STAT CHEM 8, ED
BUN: 33 mg/dL — ABNORMAL HIGH (ref 6–23)
CALCIUM ION: 0.86 mmol/L — AB (ref 1.13–1.30)
Chloride: 99 mEq/L (ref 96–112)
Creatinine, Ser: 5.8 mg/dL — ABNORMAL HIGH (ref 0.50–1.35)
Glucose, Bld: 212 mg/dL — ABNORMAL HIGH (ref 70–99)
HEMATOCRIT: 42 % (ref 39.0–52.0)
HEMOGLOBIN: 14.3 g/dL (ref 13.0–17.0)
POTASSIUM: 4 meq/L (ref 3.7–5.3)
Sodium: 140 mEq/L (ref 137–147)
TCO2: 26 mmol/L (ref 0–100)

## 2014-07-23 LAB — COMPREHENSIVE METABOLIC PANEL
ALBUMIN: 3.5 g/dL (ref 3.5–5.2)
ALK PHOS: 159 U/L — AB (ref 39–117)
ALT: 24 U/L (ref 0–53)
AST: 28 U/L (ref 0–37)
Anion gap: 18 — ABNORMAL HIGH (ref 5–15)
BILIRUBIN TOTAL: 1 mg/dL (ref 0.3–1.2)
BUN: 27 mg/dL — ABNORMAL HIGH (ref 6–23)
CHLORIDE: 98 meq/L (ref 96–112)
CO2: 24 mEq/L (ref 19–32)
Calcium: 7.6 mg/dL — ABNORMAL LOW (ref 8.4–10.5)
Creatinine, Ser: 5.6 mg/dL — ABNORMAL HIGH (ref 0.50–1.35)
GFR calc Af Amer: 11 mL/min — ABNORMAL LOW (ref >90)
GFR calc non Af Amer: 9 mL/min — ABNORMAL LOW (ref >90)
Glucose, Bld: 198 mg/dL — ABNORMAL HIGH (ref 70–99)
POTASSIUM: 4.1 meq/L (ref 3.7–5.3)
Sodium: 140 mEq/L (ref 137–147)
Total Protein: 7.8 g/dL (ref 6.0–8.3)

## 2014-07-23 LAB — I-STAT CG4 LACTIC ACID, ED: Lactic Acid, Venous: 0.9 mmol/L (ref 0.5–2.2)

## 2014-07-23 LAB — TROPONIN I
Troponin I: <0.3 ng/mL (ref <0.30)
Troponin I: <0.3 ng/mL (ref <0.30)

## 2014-07-23 LAB — CBC WITH DIFFERENTIAL/PLATELET
BASOS ABS: 0 10*3/uL (ref 0.0–0.1)
BASOS PCT: 0 % (ref 0–1)
Eosinophils Absolute: 0.1 10*3/uL (ref 0.0–0.7)
Eosinophils Relative: 2 % (ref 0–5)
HCT: 35.9 % — ABNORMAL LOW (ref 39.0–52.0)
HEMOGLOBIN: 11.9 g/dL — AB (ref 13.0–17.0)
Lymphocytes Relative: 18 % (ref 12–46)
Lymphs Abs: 1.4 10*3/uL (ref 0.7–4.0)
MCH: 35.7 pg — ABNORMAL HIGH (ref 26.0–34.0)
MCHC: 33.1 g/dL (ref 30.0–36.0)
MCV: 107.8 fL — ABNORMAL HIGH (ref 78.0–100.0)
Monocytes Absolute: 0.8 10*3/uL (ref 0.1–1.0)
Monocytes Relative: 10 % (ref 3–12)
NEUTROS ABS: 5.5 10*3/uL (ref 1.7–7.7)
NEUTROS PCT: 71 % (ref 43–77)
Platelets: 205 10*3/uL (ref 150–400)
RBC: 3.33 MIL/uL — ABNORMAL LOW (ref 4.22–5.81)
RDW: 16 % — ABNORMAL HIGH (ref 11.5–15.5)
WBC: 7.8 10*3/uL (ref 4.0–10.5)

## 2014-07-23 LAB — CBG MONITORING, ED: Glucose-Capillary: 193 mg/dL — ABNORMAL HIGH (ref 70–99)

## 2014-07-23 MED ORDER — ALLOPURINOL 100 MG PO TABS
100.0000 mg | ORAL_TABLET | Freq: Every day | ORAL | Status: DC
  Administered 2014-07-23 – 2014-07-26 (×4): 100 mg via ORAL
  Filled 2014-07-23 (×5): qty 1

## 2014-07-23 MED ORDER — SODIUM CHLORIDE 0.9 % IV SOLN: 250.0000 mL | INTRAVENOUS | Status: DC | PRN

## 2014-07-23 MED ORDER — ONDANSETRON HCL 4 MG/2ML IJ SOLN
4.0000 mg | Freq: Four times a day (QID) | INTRAMUSCULAR | Status: DC | PRN
Start: 2014-07-23 — End: 2014-07-26

## 2014-07-23 MED ORDER — ATORVASTATIN CALCIUM 40 MG PO TABS
40.0000 mg | ORAL_TABLET | Freq: Every day | ORAL | Status: DC
  Administered 2014-07-24 – 2014-07-25 (×2): 40 mg via ORAL
  Filled 2014-07-23 (×3): qty 1

## 2014-07-23 MED ORDER — SODIUM CHLORIDE 0.9 % IJ SOLN
3.0000 mL | INTRAMUSCULAR | Status: DC | PRN
  Administered 2014-07-25: 3 mL via INTRAVENOUS

## 2014-07-23 MED ORDER — SODIUM CHLORIDE 0.9 % IJ SOLN
3.0000 mL | Freq: Two times a day (BID) | INTRAMUSCULAR | Status: DC
  Administered 2014-07-24 – 2014-07-25 (×3): 3 mL via INTRAVENOUS

## 2014-07-23 MED ORDER — CINACALCET HCL 30 MG PO TABS
60.0000 mg | ORAL_TABLET | Freq: Every day | ORAL | Status: DC
  Administered 2014-07-24 – 2014-07-26 (×3): 60 mg via ORAL
  Filled 2014-07-23 (×4): qty 2

## 2014-07-23 MED ORDER — SODIUM CHLORIDE 0.9 % IV SOLN: Freq: Once | INTRAVENOUS | Status: DC

## 2014-07-23 MED ORDER — AMITRIPTYLINE HCL 25 MG PO TABS
25.0000 mg | ORAL_TABLET | Freq: Every evening | ORAL | Status: DC | PRN
  Administered 2014-07-23: 25 mg via ORAL
  Filled 2014-07-23: qty 1

## 2014-07-23 MED ORDER — SODIUM CHLORIDE 0.9 % IV BOLUS (SEPSIS)
250.0000 mL | Freq: Once | INTRAVENOUS | Status: AC
  Administered 2014-07-23: 250 mL via INTRAVENOUS

## 2014-07-23 MED ORDER — INFLUENZA VAC SPLIT QUAD 0.5 ML IM SUSY
0.5000 mL | PREFILLED_SYRINGE | INTRAMUSCULAR | Status: AC
  Administered 2014-07-24: 0.5 mL via INTRAMUSCULAR
  Filled 2014-07-23: qty 0.5

## 2014-07-23 MED ORDER — PNEUMOCOCCAL VAC POLYVALENT 25 MCG/0.5ML IJ INJ
0.5000 mL | INJECTION | INTRAMUSCULAR | Status: AC
  Administered 2014-07-24: 0.5 mL via INTRAMUSCULAR
  Filled 2014-07-23: qty 0.5

## 2014-07-23 MED ORDER — ONDANSETRON HCL 4 MG PO TABS: 4.0000 mg | ORAL_TABLET | Freq: Four times a day (QID) | ORAL | Status: DC | PRN

## 2014-07-23 MED ORDER — SODIUM CHLORIDE 0.9 % IV SOLN
Freq: Once | INTRAVENOUS | Status: AC
  Administered 2014-07-23: 16:00:00 via INTRAVENOUS

## 2014-07-23 NOTE — ED Notes (Signed)
Pt given turkey sandwich and gingerale. 

## 2014-07-23 NOTE — ED Notes (Signed)
Patient states that his bp drops and stays low.  Patient states he has dizzy spells at times.  Granddaughter states that patient was at bank this morning and they advised that patient "passed out".  Patient states "i don't think so, but i got dizzy and my legs gave out".

## 2014-07-23 NOTE — ED Provider Notes (Signed)
CSN: [id number]     Arrival date & time 07/23/14  1305 History   First MD Initiated Contact with Patient 07/23/14 1406     Chief Complaint  Patient presents with  . Loss of Consciousness     (Consider location/radiation/quality/duration/timing/severity/associated sxs/prior Treatment) Patient is a 68 y.o. male presenting with syncope. The history is provided by the patient.  Loss of Consciousness Episode history:  Single Associated symptoms: no chest pain, no headaches, no nausea, no shortness of breath, no vomiting and no weakness    and a patient is felt lightheaded and passed out versus near syncope at the bank today. No chest pain. No trouble breathing. He states he has been eating less. He is a dialysis patient and had dialysis yesterday. No fevers. No nausea vomiting or diarrhea. No headache. No confusion. Found to be hypotensive. Patient states he gets this after dialysis. States it is usually not there the next day. Patient states she has had some chills.   Past Medical History  Diagnosis Date  . Diabetes mellitus without complication   . ESRD on hemodialysis     [name], [name], ESRD due to DM/HTN, started dialysis in Sep 12, 2012  . Obesity   . Gout   . Renal failure   . Chest pain 03/05/2014  . Hypertension   . Shortness of breath   . Arthritis     ALL OVER   Past Surgical History  Procedure Laterality Date  . Insertion of dialysis catheter  09/15/2012    Procedure: INSERTION OF DIALYSIS CATHETER;  Surgeon: Chuck Hint, MD;  Location: [facility] OR;  Service: Vascular;  Laterality: N/A;  . Menisectomy      L knee  . Av fistula placement Left    Family History  Problem Relation Age of Onset  . Diabetes Mellitus II    . Hypertension    . Hypertension Mother   . Diabetes Mother   . Hypertension Father    History  Substance Use Topics  . Smoking status: Never Smoker   . Smokeless tobacco: Former Neurosurgeon    Quit date: 11/28/1962  . Alcohol Use: No    Review of  Systems  Constitutional: Negative for activity change and appetite change.  Eyes: Negative for pain.  Respiratory: Negative for chest tightness and shortness of breath.   Cardiovascular: Positive for syncope. Negative for chest pain and leg swelling.  Gastrointestinal: Negative for nausea, vomiting, abdominal pain and diarrhea.  Genitourinary: Negative for flank pain.  Musculoskeletal: Negative for back pain and neck stiffness.  Skin: Negative for rash.  Neurological: Positive for light-headedness. Negative for weakness, numbness and headaches.  Psychiatric/Behavioral: Negative for behavioral problems.      Allergies  Review of patient's allergies indicates no known allergies.  Home Medications   Prior to Admission medications   Medication Sig Start Date End Date Taking? Authorizing Provider  allopurinol (ZYLOPRIM) 100 MG tablet Take 100 mg by mouth daily. 06/25/14  Yes Historical Provider, MD  amitriptyline (ELAVIL) 25 MG tablet Take 25 mg by mouth at bedtime as needed for sleep.    Yes Historical Provider, MD  atorvastatin (LIPITOR) 40 MG tablet Take 1 tablet (40 mg total) by mouth daily at 6 PM. 03/07/14  Yes Ricki Rodriguez, MD  b complex-vitamin c-folic acid (NEPHRO-VITE) 0.8 MG TABS tablet Take 0.8 mg by mouth daily.    Yes Historical Provider, MD  cinacalcet (SENSIPAR) 30 MG tablet Take 60 mg by mouth daily.   Yes Historical  Provider, MD  lanthanum (FOSRENOL) 1000 MG chewable tablet Chew 1,000 mg by mouth 2 (two) times daily with a meal.   Yes Historical Provider, MD  lidocaine (XYLOCAINE) 2 % solution Apply to affected area using cotton swab every 3 hrs as needed for pain 02/14/14  Yes Mathis Fare Presson, PA  metoprolol tartrate (LOPRESSOR) 25 MG tablet Take 12.5 mg by mouth 2 (two) times daily.   Yes Historical Provider, MD   BP 97/74  Pulse 86  Temp(Src) 98 F (36.7 C) (Oral)  Resp 25  Ht  (1.88 m)  Wt 288 lb 12.8 oz (131 kg)  BMI 37.06 kg/m2  SpO2 94% Physical  Exam  Nursing note and vitals reviewed. Constitutional: He is oriented to person, place, and time. He appears well-developed and well-nourished.  Patient is obese  HENT:  Head: Normocephalic and atraumatic.  Eyes: EOM are normal. Pupils are equal, round, and reactive to light.  Neck: Normal range of motion. Neck supple.  Cardiovascular: Normal rate, regular rhythm and normal heart sounds.   No murmur heard. Pulmonary/Chest: Effort normal and breath sounds normal.  Abdominal: Soft. Bowel sounds are normal. He exhibits no distension and no mass. There is no tenderness. There is no rebound and no guarding.  Musculoskeletal: Normal range of motion. He exhibits no edema.  Neurological: He is alert and oriented to person, place, and time. No cranial nerve deficit.  Skin: Skin is warm and dry.  Psychiatric: He has a normal mood and affect.    ED Course  Procedures (including critical care time) Labs Review Labs Reviewed  CBC WITH DIFFERENTIAL - Abnormal; Notable for the following:    RBC 3.33 (*)    Hemoglobin 11.9 (*)    HCT 35.9 (*)    MCV 107.8 (*)    MCH 35.7 (*)    RDW 16.0 (*)    All other components within normal limits  COMPREHENSIVE METABOLIC PANEL - Abnormal; Notable for the following:    Glucose, Bld 198 (*)    BUN 27 (*)    Creatinine, Ser 5.60 (*)    Calcium 7.6 (*)    Alkaline Phosphatase 159 (*)    GFR calc non Af Amer 9 (*)    GFR calc Af Amer 11 (*)    Anion gap 18 (*)    All other components within normal limits  BASIC METABOLIC PANEL - Abnormal; Notable for the following:    Glucose, Bld 144 (*)    BUN 33 (*)    Creatinine, Ser 6.25 (*)    Calcium 6.9 (*)    GFR calc non Af Amer 8 (*)    GFR calc Af Amer 10 (*)    Anion gap 20 (*)    All other components within normal limits  CBC - Abnormal; Notable for the following:    RBC 3.36 (*)    Hemoglobin 12.0 (*)    HCT 36.2 (*)    MCV 107.7 (*)    MCH 35.7 (*)    RDW 16.3 (*)    All other components  within normal limits  HEMOGLOBIN A1C - Abnormal; Notable for the following:    Hemoglobin A1C 7.1 (*)    Mean Plasma Glucose 157 (*)    All other components within normal limits  GLUCOSE, CAPILLARY - Abnormal; Notable for the following:    Glucose-Capillary 159 (*)    All other components within normal limits  CBG MONITORING, ED - Abnormal; Notable for the following:  Glucose-Capillary 193 (*)    All other components within normal limits  I-STAT CHEM 8, ED - Abnormal; Notable for the following:    BUN 33 (*)    Creatinine, Ser 5.80 (*)    Glucose, Bld 212 (*)    Calcium, Ion 0.86 (*)    All other components within normal limits  CULTURE, BLOOD (ROUTINE X 2)  CULTURE, BLOOD (ROUTINE X 2)  MRSA PCR SCREENING  TROPONIN I  TROPONIN I  TROPONIN I  TROPONIN I  RENAL FUNCTION PANEL  CBC  CBG MONITORING, ED  I-STAT CG4 LACTIC ACID, ED    Imaging Review Dg Chest 2 View  07/23/2014   CLINICAL DATA:  Short of breath  EXAM: CHEST  2 VIEW  COMPARISON:  03/05/2014  FINDINGS: Cardiomegaly. Normal vascularity. Low volumes. Bibasilar atelectasis. No pleural effusion. No pneumothorax. Central vascular crowding.  IMPRESSION: Cardiomegaly without edema.  Bibasilar atelectasis.   Electronically Signed   By: Maryclare Bean M.D.   On: 07/23/2014 17:27     EKG Interpretation   Date/Time:  Tuesday July 23 2014 13:16:00 EDT Ventricular Rate:  78 PR Interval:  192 QRS Duration: 140 QT Interval:  492 QTC Calculation: 560 R Axis:   -96 Text Interpretation:  Sinus rhythm with Premature atrial complexes and  Premature ventricular complexes or Fusion complexes Right bundle branch  block Inferior infarct , age undetermined Anterolateral infarct , age  undetermined Abnormal ECG No significant change since last tracing  Confirmed by Elektra Wartman  MD, Harrold Donath [phone]) on 07/23/2014 2:18:15 PM      MDM   Final diagnoses:  Anemia in chronic renal disease  CKD (chronic kidney disease) stage 5, GFR less  than 15 ml/min  ESRD on dialysis  Other specified hypotension    Patient with syncope. Hypotension. May be due 2 dialysis. IV fluids given. No fever. Signed out to oncoming physician. Some lab work still pending    [employer]. Rubin Payor, MD 07/25/14 6283

## 2014-07-23 NOTE — ED Provider Notes (Signed)
68 year old man on dialysis with history of hypertension and diabetes who presents today after a syncopal episode. Workup here reveals no evidence of acute cardiac ischemia, normal lactic acid, stable EKG and laboratory values. He received a fluid bolus here but has continued to have some low blood pressures. I suspect that the patient has orthostatic hypotension do to his Lopressor which he says he was taken off by one doctor and then had it restarted again by his cardiologist several months ago area and he will require further evaluation for this and stabilization of his blood pressure. Discussed with Dr. Onalee Hua and plan observation.   Hilario Quarry, MD 07/23/14 [phone]

## 2014-07-23 NOTE — ED Notes (Signed)
pts EKG signed by Dr. Rubin Payor and CBG= 197 reported to [place], [place].

## 2014-07-23 NOTE — ED Notes (Signed)
Patient transported to X-ray 

## 2014-07-23 NOTE — H&P (Signed)
PCP:   Barbette Reichmann, MD   Chief Complaint:  Larey Seat at bank  HPI: 68 yo male esrd dialysis mwf, htn comes in after falling at the bank today.  He was at the bank, he got up and after a couple of steps got very dizzy and fell.  He denies he had any loc, but edp reported he had syncopal episode.  Pt denies this.  ems was called at the bank, and his sbp was in the low 80s.  Pt denies any pain at this time, no cp, no sob.  No fevers.  No n/v/d.  He says he gets dizzy a lot and his bp is always low especially after dialysis.  His bblocker was stopped, but then restarted by his cardiologist in the last couple of months.  Denies any le edema or swelling.    Review of Systems:  Positive and negative as per HPI otherwise all other systems are negative  Past Medical History: Past Medical History  Diagnosis Date  . Diabetes mellitus without complication   . ESRD on hemodialysis     [name], [name], ESRD due to DM/HTN, started dialysis in Sep 12, 2012  . Obesity   . Gout   . Renal failure   . Chest pain 03/05/2014  . Hypertension   . Shortness of breath   . Arthritis     ALL OVER   Past Surgical History  Procedure Laterality Date  . Insertion of dialysis catheter  09/15/2012    Procedure: INSERTION OF DIALYSIS CATHETER;  Surgeon: Chuck Hint, MD;  Location: [facility] OR;  Service: Vascular;  Laterality: N/A;  . Menisectomy      L knee  . Av fistula placement Left     Medications: Prior to Admission medications   Medication Sig Start Date End Date Taking? Authorizing Provider  allopurinol (ZYLOPRIM) 100 MG tablet Take 100 mg by mouth daily. 06/25/14  Yes Historical Provider, MD  amitriptyline (ELAVIL) 25 MG tablet Take 25 mg by mouth at bedtime as needed for sleep.    Yes Historical Provider, MD  atorvastatin (LIPITOR) 40 MG tablet Take 1 tablet (40 mg total) by mouth daily at 6 PM. 03/07/14  Yes Ricki Rodriguez, MD  b complex-vitamin c-folic acid (NEPHRO-VITE) 0.8 MG TABS tablet Take 0.8  mg by mouth daily.    Yes Historical Provider, MD  cinacalcet (SENSIPAR) 30 MG tablet Take 60 mg by mouth daily.   Yes Historical Provider, MD  lanthanum (FOSRENOL) 1000 MG chewable tablet Chew 1,000 mg by mouth 2 (two) times daily with a meal.   Yes Historical Provider, MD  lidocaine (XYLOCAINE) 2 % solution Apply to affected area using cotton swab every 3 hrs as needed for pain 02/14/14  Yes Mathis Fare Presson, PA  metoprolol tartrate (LOPRESSOR) 25 MG tablet Take 12.5 mg by mouth 2 (two) times daily.   Yes Historical Provider, MD    Allergies:  No Known Allergies  Social History:  reports that he has never smoked. He quit smokeless tobacco use about 51 years ago. He reports that he does not drink alcohol or use illicit drugs.  Family History: Family History  Problem Relation Age of Onset  . Diabetes Mellitus II    . Hypertension    . Hypertension Mother   . Diabetes Mother   . Hypertension Father     Physical Exam: Filed Vitals:   07/23/14 1619 07/23/14 1656 07/23/14 1736 07/23/14 1755  BP: 92/61 91/68 85/60  148/84  Pulse:  75  76 78  Temp:  97.8 F (36.6 C)    TempSrc:      Resp: Height:      Weight:      SpO2: 100% 99% 100% 99%   General appearance: alert, cooperative and no distress Head: Normocephalic, without obvious abnormality, atraumatic Eyes: negative Nose: Nares normal. Septum midline. Mucosa normal. No drainage or sinus tenderness. Neck: no JVD and supple, symmetrical, trachea midline Lungs: clear to auscultation bilaterally Heart: regular rate and rhythm, S1, S2 normal, no murmur, click, rub or gallop Abdomen: soft, non-tender; bowel sounds normal; no masses,  no organomegaly Extremities: extremities normal, atraumatic, no cyanosis or edema Pulses: 2+ and symmetric Skin: Skin color, texture, turgor normal. No rashes or lesions Neurologic: Grossly normal  Labs on Admission:   Recent Labs  07/23/14 1339 07/23/14 1409  NA 140 140  K  4.0 4.1  CL 99 98  CO2  --  24  GLUCOSE 212* 198*  BUN 33* 27*  CREATININE 5.80* 5.60*  CALCIUM  --  7.6*    Recent Labs  07/23/14 1409  AST 28  ALT 24  ALKPHOS 159*  BILITOT 1.0  PROT 7.8  ALBUMIN 3.5    Recent Labs  07/23/14 1339 07/23/14 1409  WBC  --  7.8  NEUTROABS  --  5.5  HGB 14.3 11.9*  HCT 42.0 35.9*  MCV  --  107.8*  PLT  --  205    Recent Labs  07/23/14 1409  TROPONINI <0.30   Radiological Exams on Admission: Dg Chest 2 View  07/23/2014   CLINICAL DATA:  Short of breath  EXAM: CHEST  2 VIEW  COMPARISON:  03/05/2014  FINDINGS: Cardiomegaly. Normal vascularity. Low volumes. Bibasilar atelectasis. No pleural effusion. No pneumothorax. Central vascular crowding.  IMPRESSION: Cardiomegaly without edema.  Bibasilar atelectasis.   Electronically Signed   By: Maryclare Bean M.D.   On: 07/23/2014 17:27    Assessment/Plan  68 yo male with presyncope/?syncopal episode likely from significant orthostatic hypotension  Principal Problem:   Syncope? Vs presyncope-  Suspect from orthostasis.  Will hold bblocker.  Has received 750 ivf bolus and his bp is improved in the 90s now.  No source or symptoms for infection.  Place in stepdown obs overnight due to his hypotension.  Will hold off on any further ivf unless his bp drops again.  No focal neuro deficits.  freq neuro cks overnight but this is normal at this time.  Ck orthostatic vitals.  Active Problems:  Stable unless o/w noted   Anemia in chronic renal disease-  No overt bleeding or signs thereof.   ESRD on dialysis-  Left message for dialysis unit in am for his routine session.   Hypotension  Obs on stepdown.  Full code.  DAVID,RACHAL A 07/23/2014, 6:16 PM

## 2014-07-24 DIAGNOSIS — I420 Dilated cardiomyopathy: Secondary | ICD-10-CM | POA: Diagnosis present

## 2014-07-24 DIAGNOSIS — R0600 Dyspnea, unspecified: Secondary | ICD-10-CM | POA: Diagnosis present

## 2014-07-24 LAB — HEMOGLOBIN A1C
HEMOGLOBIN A1C: 7.1 % — AB (ref <5.7)
MEAN PLASMA GLUCOSE: 157 mg/dL — AB (ref <117)

## 2014-07-24 LAB — MRSA PCR SCREENING: MRSA BY PCR: NEGATIVE

## 2014-07-24 LAB — CBC
HCT: 36.2 % — ABNORMAL LOW (ref 39.0–52.0)
HEMOGLOBIN: 12 g/dL — AB (ref 13.0–17.0)
MCH: 35.7 pg — AB (ref 26.0–34.0)
MCHC: 33.1 g/dL (ref 30.0–36.0)
MCV: 107.7 fL — ABNORMAL HIGH (ref 78.0–100.0)
Platelets: 187 10*3/uL (ref 150–400)
RBC: 3.36 MIL/uL — AB (ref 4.22–5.81)
RDW: 16.3 % — ABNORMAL HIGH (ref 11.5–15.5)
WBC: 7.3 10*3/uL (ref 4.0–10.5)

## 2014-07-24 LAB — BASIC METABOLIC PANEL
Anion gap: 20 — ABNORMAL HIGH (ref 5–15)
BUN: 33 mg/dL — AB (ref 6–23)
CHLORIDE: 99 meq/L (ref 96–112)
CO2: 21 meq/L (ref 19–32)
Calcium: 6.9 mg/dL — ABNORMAL LOW (ref 8.4–10.5)
Creatinine, Ser: 6.25 mg/dL — ABNORMAL HIGH (ref 0.50–1.35)
GFR calc non Af Amer: 8 mL/min — ABNORMAL LOW (ref >90)
GFR, EST AFRICAN AMERICAN: 10 mL/min — AB (ref >90)
Glucose, Bld: 144 mg/dL — ABNORMAL HIGH (ref 70–99)
POTASSIUM: 4.2 meq/L (ref 3.7–5.3)
Sodium: 140 mEq/L (ref 137–147)

## 2014-07-24 LAB — GLUCOSE, CAPILLARY: GLUCOSE-CAPILLARY: 159 mg/dL — AB (ref 70–99)

## 2014-07-24 LAB — TROPONIN I: Troponin I: <0.3 ng/mL (ref <0.30)

## 2014-07-24 MED ORDER — LANTHANUM CARBONATE 500 MG PO CHEW
1000.0000 mg | CHEWABLE_TABLET | Freq: Three times a day (TID) | ORAL | Status: DC
  Administered 2014-07-24 – 2014-07-25 (×2): 1000 mg via ORAL
  Filled 2014-07-24 (×6): qty 2

## 2014-07-24 MED ORDER — HEPARIN SODIUM (PORCINE) 1000 UNIT/ML IJ SOLN: 7000.0000 [IU] | Freq: Once | INTRAMUSCULAR | Status: DC

## 2014-07-24 MED ORDER — INSULIN ASPART 100 UNIT/ML ~~LOC~~ SOLN
0.0000 [IU] | Freq: Three times a day (TID) | SUBCUTANEOUS | Status: DC
Start: 2014-07-24 — End: 2014-07-25

## 2014-07-24 MED ORDER — RENA-VITE PO TABS
1.0000 | ORAL_TABLET | Freq: Every day | ORAL | Status: DC
  Administered 2014-07-24 – 2014-07-25 (×2): 1 via ORAL
  Filled 2014-07-24 (×3): qty 1

## 2014-07-24 MED ORDER — HEPARIN SODIUM (PORCINE) 1000 UNIT/ML DIALYSIS
7000.0000 [IU] | Freq: Once | INTRAMUSCULAR | Status: AC
  Administered 2014-07-24: 7000 [IU] via INTRAVENOUS_CENTRAL

## 2014-07-24 MED ORDER — INSULIN ASPART 100 UNIT/ML ~~LOC~~ SOLN: 0.0000 [IU] | Freq: Every day | SUBCUTANEOUS | Status: DC

## 2014-07-24 NOTE — Consult Note (Signed)
Indication for Consultation:  Management of ESRD/hemodialysis; anemia, hypertension/volume and secondary hyperparathyroidism  HPI: Nicolas Mccann is a 68 y.o. male who presented to the ED yesterday after a syncopal episode yesterday. Receives HD MWF @ [facility]. Reports last HD Monday went well, no  Dizzy/lightheaded. Yesterday he was at the bank with his granddaughter around 11am, he was sitting and when he stood up he got dizzy and fell, denies any other symptoms at the time or just before. Denies LOC or hitting head. Is diabetic and did not eat breakfast. Denies other dizzyness or syncopal episodes. Feeling well now. Will arrange for HD today  Past Medical History  Diagnosis Date  . Diabetes mellitus without complication   . ESRD on hemodialysis     [name], [name], ESRD due to DM/HTN, started dialysis in Sep 12, 2012  . Obesity   . Gout   . Renal failure   . Chest pain 03/05/2014  . Hypertension   . Shortness of breath   . Arthritis     ALL OVER   Past Surgical History  Procedure Laterality Date  . Insertion of dialysis catheter  09/15/2012    Procedure: INSERTION OF DIALYSIS CATHETER;  Surgeon: Chuck Hint, MD;  Location: [facility] OR;  Service: Vascular;  Laterality: N/A;  . Menisectomy      L knee  . Av fistula placement Left    Family History  Problem Relation Age of Onset  . Diabetes Mellitus II    . Hypertension    . Hypertension Mother   . Diabetes Mother   . Hypertension Father    Social History:  reports that he has never smoked. He quit smokeless tobacco use about 51 years ago. He reports that he does not drink alcohol or use illicit drugs. No Known Allergies Prior to Admission medications   Medication Sig Start Date End Date Taking? Authorizing Provider  allopurinol (ZYLOPRIM) 100 MG tablet Take 100 mg by mouth daily. 06/25/14  Yes Historical Provider, MD  amitriptyline (ELAVIL) 25 MG tablet Take 25 mg by mouth at bedtime as needed for sleep.    Yes Historical  Provider, MD  atorvastatin (LIPITOR) 40 MG tablet Take 1 tablet (40 mg total) by mouth daily at 6 PM. 03/07/14  Yes Ricki Rodriguez, MD  b complex-vitamin c-folic acid (NEPHRO-VITE) 0.8 MG TABS tablet Take 0.8 mg by mouth daily.    Yes Historical Provider, MD  cinacalcet (SENSIPAR) 30 MG tablet Take 60 mg by mouth daily.   Yes Historical Provider, MD  lanthanum (FOSRENOL) 1000 MG chewable tablet Chew 1,000 mg by mouth 2 (two) times daily with a meal.   Yes Historical Provider, MD  lidocaine (XYLOCAINE) 2 % solution Apply to affected area using cotton swab every 3 hrs as needed for pain 02/14/14  Yes Mathis Fare Presson, PA  metoprolol tartrate (LOPRESSOR) 25 MG tablet Take 12.5 mg by mouth 2 (two) times daily.   Yes Historical Provider, MD   Current Facility-Administered Medications  Medication Dose Route Frequency Provider Last Rate Last Dose  . 0.9 %  sodium chloride infusion  250 mL Intravenous PRN Haydee Monica, MD      . allopurinol (ZYLOPRIM) tablet 100 mg  100 mg Oral Daily Haydee Monica, MD   100 mg at 07/24/14 0912  . amitriptyline (ELAVIL) tablet 25 mg  25 mg Oral QHS PRN Haydee Monica, MD   25 mg at 07/23/14 2336  . atorvastatin (LIPITOR) tablet 40 mg  40 mg Oral q1800 Haydee Monica, MD      . cinacalcet [facility]) tablet 60 mg  60 mg Oral Q breakfast Haydee Monica, MD   60 mg at 07/24/14 0910  . Influenza vac split quadrivalent PF (FLUARIX) injection 0.5 mL  0.5 mL Intramuscular Tomorrow-1000 Haydee Monica, MD      . ondansetron [facility]) tablet 4 mg  4 mg Oral Q6H PRN Haydee Monica, MD       Or  . ondansetron (ZOFRAN) injection 4 mg  4 mg Intravenous Q6H PRN Haydee Monica, MD      . pneumococcal 23 valent vaccine (PNU-IMMUNE) injection 0.5 mL  0.5 mL Intramuscular Tomorrow-1000 Haydee Monica, MD      . sodium chloride 0.9 % injection 3 mL  3 mL Intravenous Q12H Haydee Monica, MD      . sodium chloride 0.9 % injection 3 mL  3 mL Intravenous PRN Haydee Monica, MD        Labs: Basic Metabolic Panel:  Recent Labs Lab 07/23/14 1339 07/23/14 1409 07/24/14 0303  NA 140 140 140  K 4.0 4.1 4.2  CL 99 98 99  CO2  --  24 21  GLUCOSE 212* 198* 144*  BUN 33* 27* 33*  CREATININE 5.80* 5.60* 6.25*  CALCIUM  --  7.6* 6.9*   Liver Function Tests:  Recent Labs Lab 07/23/14 1409  AST 28  ALT 24  ALKPHOS 159*  BILITOT 1.0  PROT 7.8  ALBUMIN 3.5   No results found for this basename: LIPASE, AMYLASE,  in the last 168 hours No results found for this basename: AMMONIA,  in the last 168 hours CBC:  Recent Labs Lab 07/23/14 1339 07/23/14 1409 07/24/14 0303  WBC  --  7.8 7.3  NEUTROABS  --  5.5  --   HGB 14.3 11.9* 12.0*  HCT 42.0 35.9* 36.2*  MCV  --  107.8* 107.7*  PLT  --  205 187   Cardiac Enzymes:  Recent Labs Lab 07/23/14 1409 07/23/14 2156 07/24/14 0303  TROPONINI <0.30 <0.30 <0.30   CBG:  Recent Labs Lab 07/23/14 1326  GLUCAP 193*   Iron Studies: No results found for this basename: IRON, TIBC, TRANSFERRIN, FERRITIN,  in the last 72 hours Studies/Results: Dg Chest 2 View  07/23/2014   CLINICAL DATA:  Short of breath  EXAM: CHEST  2 VIEW  COMPARISON:  03/05/2014  FINDINGS: Cardiomegaly. Normal vascularity. Low volumes. Bibasilar atelectasis. No pleural effusion. No pneumothorax. Central vascular crowding.  IMPRESSION: Cardiomegaly without edema.  Bibasilar atelectasis.   Electronically Signed   By: Maryclare Bean M.D.   On: 07/23/2014 17:27     Review of Systems: Dizziness yesterday, now resolved. Denies chest pain, palpitations, sob, leg pain or swelling.  Good appetite. Does not check glucose at home.    Physical Exam: Filed Vitals:   07/24/14 0729 07/24/14 0730 07/24/14 0732 07/24/14 0830  BP: 99/75 101/76 101/81   Pulse:      Temp:    97.4 F (36.3 C)  TempSrc:      Resp:      Height:      Weight:      SpO2:         General: Well developed, well nourished, in no acute distress. Head: Normocephalic, atraumatic,  sclera non-icteric, mucus membranes are moist Neck: Supple. JVD not elevated. Lungs: Clear bilaterally to auscultation without wheezes, rales, or rhonchi. Breathing is unlabored. Heart: RRR with  S1 S2. No murmurs, rubs, or gallops appreciated. Abdomen: Soft, obese, non-tender, non-distended with normoactive bowel sounds. No rebound/guarding. No obvious abdominal masses. M-S:  Strength and tone appear normal for age. Lower extremities:without edema or ischemic changes, no open wounds  Neuro: Alert and oriented X 3. Moves all extremities spontaneously. Psych:  Responds to questions appropriately with a normal affect. Dialysis Access: L AVg +b/t  Dialysis Orders:  MWF @ [facility]  4hr  131 kgs  2K/2Ca  7000 Heparin  L AVF 450/800 No meds Profile 4  Assessment/Plan: 1.  syncope- orthostatic hypotension due to beta blocker vs. 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 troponin negative. Blood cultures no growth to date. Chest xray clear 2.  ESRD -  MWF @ [facility]. HD today, recent increase in edw. K+ 4.2 3.  Hypertension/volume  - 101/81 holding metop 4.  Anemia  - hgb 12. No ESA and Fe. Watch cbc 5.  Metabolic bone disease -  corrceted Ca+ 8.4. Added Ca+ bath today. sensipar and fosrenol- last PTH 360 and phos4.4 6.  Nutrition - renal diet. Alb 3,.5. Multi vit  Jetty Duhamel, NP [employer] [phone] 07/24/2014, 9:42 AM     I have seen and examined this patient and agree with plan as outlined by B. Barnetta Chapel with the exception of the additions above.  Feels better. Sharetha Newson A,MD 07/24/2014 10:51 AM

## 2014-07-24 NOTE — Progress Notes (Signed)
Utilization Review Completed.Mauricio Dahlen T9/16/2015  

## 2014-07-24 NOTE — Progress Notes (Signed)
Inpatient Diabetes Program Recommendations  AACE/ADA: New Consensus Statement on Inpatient Glycemic Control (2013)  Target Ranges:  Prepandial:   less than 140 mg/dL      Peak postprandial:   less than 180 mg/dL (1-2 hours)      Critically ill patients:  140 - 180 mg/dL   Results for DARYLL, DOTO (MRN [id number]) as of 07/24/2014 08:45  Ref. Range 07/23/2014 13:39 07/23/2014 14:09 07/24/2014 03:03  Glucose Latest Range: 70-99 mg/dL 537 (H) 943 (H) 276 (H)   Results for KINNEY, GUERECA (MRN [id number]) as of 07/24/2014 08:45  Ref. Range 03/05/2014 13:52  Hemoglobin A1C Latest Range: <5.7 % 7.1 (H)   Diabetes history: DM2 Outpatient Diabetes medications: None Current orders for Inpatient glycemic control: None  Inpatient Diabetes Program Recommendations Correction (SSI): Patient has a history of diabetes. Please order CBGs with Novolog sensitive correction scale. HgbA1C: Last A1C in the chart is 7.1% on 03/05/14. Please consider ordering an A1C to evaluate glycemic control over the past 2-3 months.  Thanks, Orlando Penner, RN, MSN, CCRN Diabetes Coordinator Inpatient Diabetes Program [phone] (Team Pager) [phone] ([facility] office) [phone] [facility] office)

## 2014-07-24 NOTE — Progress Notes (Signed)
[facility]  LERAY GARVERICK HQI:[id number] DOB: [birth year] DOA: 07/23/2014 PCP: Barbette Reichmann, MD  Brief narrative: 68 year old male patient on dialysis who presented to the hospital after syncopal episode. EMS was called and patient's blood pressure was noted to be in the low 80s. He reported to the admitting physician that he has frequent dizziness and his BP is always low especially after dialysis treatment. His beta blocker had been stopped but a lower doses was restarted by his cardiologist in April.  In the ER he received 750 cc of saline with improvement in systolic blood pressure to greater than 90. Chest x-ray was without edema. Labs were unremarkable except for mildly elevated serum glucose of 212.  HPI/Subjective: Patient primarily complained of resting dyspnea. Currently denies dizziness  Assessment/Plan:    Syncope/Orthostatic hypotension Seems primarily precipitated by volume depletion post dialysis in setting of use of antihypertensive-continue to hold beta blocker and monitor for breakthrough tachycardia-monitor for recurrent orthostatic hypotension postdialysis-may benefit from midodrine therapy if not contraindicated by mild dilated cardiomyopathy    CKD stage V requiring chronic dialysis Per nephrology-dialysis days are MWF    Dyspnea/Congestive dilated cardiomyopathy/EF 35-40% Evaluated by cardiology/Dr. Eden Emms March 2015 as an outpatient-concerns at that time were for dilated cardiomyopathy and possible pulmonary hypertension (as cause of low BP during dialysis)-echo did reveal dilated cardiomyopathy-patient readmitted to the hospital in April 2015 with chest pain at rest and was offered an ischemic evaluation with either stress test or cardiac catheterization by Dr. Algie Coffer but patient preferred initiation of medical therapy-persistent dyspnea likely related to above causes but also could be related to deconditioning therefore we'll  have PT and OT evaluate-at some point as an out patient had been prescribed Imdur but this has also been discontinued    Anemia in chronic renal disease Hemoglobin stable and actually somewhat high for this patient (with his baseline being around 9-10) which is likely indicative of volume depletion    Diabetes mellitus with renal complications Hemoglobin A1c was 7.1 in April and does not appear to have been rechecked so will recheck this admission-current CBGs controlled-cont SSI    HTN  BP remains soft so previously prescribed Lopressor has been discontinued  DVT prophylaxis: SCDs Code Status: Full Family Communication: No family at bedside Disposition Plan/Expected LOS: Step down  Consultants: Nephrology  Procedures: none  Antibiotics: none  Objective: Blood pressure 99/67, pulse 83, temperature 97.7 F (36.5 C), temperature source Oral, resp. rate 20, height  (1.88 m), weight 293 lb 6.9 oz (133.1 kg), SpO2 94.00%.  Intake/Output Summary (Last 24 hours) at 07/24/14 1105 Last data filed at 07/24/14 0800  Gross per 24 hour  Intake    950 ml  Output      0 ml  Net    950 ml   Exam: Gen: No acute respiratory distress but endorsing a sensation of shortness of breath even at rest Chest: Clear to auscultation bilaterally without wheezes, rhonchi or crackles, room air Cardiac: Regular rate and rhythm, S1-S2, no rubs murmurs or gallops, no peripheral edema, no JVD Abdomen: Soft nontender nondistended without obvious hepatosplenomegaly, no ascites Extremities: Symmetrical in appearance without cyanosis, clubbing or effusion  Scheduled Meds:  Scheduled Meds: . allopurinol  100 mg Oral Daily  . atorvastatin  40 mg Oral q1800  . cinacalcet  60 mg Oral Q breakfast  . heparin  7,000 Units Dialysis Once in dialysis  . Influenza vac split quadrivalent PF  0.5  mL Intramuscular Tomorrow-1000  . lanthanum  1,000 mg Oral TID WC  . multivitamin  1 tablet Oral QHS  . pneumococcal  23 valent vaccine  0.5 mL Intramuscular Tomorrow-1000  . sodium chloride  3 mL Intravenous Q12H   Data Reviewed: Basic Metabolic Panel:  Recent Labs Lab 07/23/14 1339 07/23/14 1409 07/24/14 0303  NA 140 140 140  K 4.0 4.1 4.2  CL 99 98 99  CO2  --  24 21  GLUCOSE 212* 198* 144*  BUN 33* 27* 33*  CREATININE 5.80* 5.60* 6.25*  CALCIUM  --  7.6* 6.9*   Liver Function Tests:  Recent Labs Lab 07/23/14 1409  AST 28  ALT 24  ALKPHOS 159*  BILITOT 1.0  PROT 7.8  ALBUMIN 3.5   CBC:  Recent Labs Lab 07/23/14 1339 07/23/14 1409 07/24/14 0303  WBC  --  7.8 7.3  NEUTROABS  --  5.5  --   HGB 14.3 11.9* 12.0*  HCT 42.0 35.9* 36.2*  MCV  --  107.8* 107.7*  PLT  --  205 187   Cardiac Enzymes:  Recent Labs Lab 07/23/14 1409 07/23/14 2156 07/24/14 0303 07/24/14 0905  TROPONINI <0.30 <0.30 <0.30 <0.30   BNP (last 3 results)  Recent Labs  03/05/14 1853  PROBNP 7838.0*   CBG:  Recent Labs Lab 07/23/14 1326  GLUCAP 193*    Recent Results (from the past 240 hour(s))  CULTURE, BLOOD (ROUTINE X 2)     Status: None   Collection Time    07/23/14  4:40 PM      Result Value Ref Range Status   Specimen Description BLOOD RIGHT FOREARM   Final   Special Requests BOTTLES DRAWN AEROBIC AND ANAEROBIC 9CC   Final   Culture  Setup Time     Final   Value: 07/23/2014 22:33     Performed at [facility]   Culture     Final   Value:        BLOOD CULTURE RECEIVED NO GROWTH TO DATE CULTURE WILL BE HELD FOR 5 DAYS BEFORE ISSUING A FINAL NEGATIVE REPORT     Performed at [facility]   Report Status PENDING   Incomplete  CULTURE, BLOOD (ROUTINE X 2)     Status: None   Collection Time    07/23/14  4:50 PM      Result Value Ref Range Status   Specimen Description BLOOD ARM RIGHT   Final   Special Requests BOTTLES DRAWN AEROBIC AND ANAEROBIC 10CC   Final   Culture  Setup Time     Final   Value: 07/23/2014 22:30     Performed at [facility]    Culture     Final   Value:        BLOOD CULTURE RECEIVED NO GROWTH TO DATE CULTURE WILL BE HELD FOR 5 DAYS BEFORE ISSUING A FINAL NEGATIVE REPORT     Performed at [facility]   Report Status PENDING   Incomplete  MRSA PCR SCREENING     Status: None   Collection Time    07/23/14  9:29 PM      Result Value Ref Range Status   MRSA by PCR NEGATIVE  NEGATIVE Final   Comment:            The GeneXpert MRSA Assay (FDA     approved for NASAL specimens     only), is one component of a     comprehensive MRSA colonization  surveillance program. It is not     intended to diagnose MRSA     infection nor to guide or     monitor treatment for     MRSA infections.     Studies:  Recent x-ray studies have been reviewed in detail by the Attending Physician  Time spent :  35 mins      Junious Silk, ANP [facility] Office  [phone] Pager [phone]  On-Call/Text Page:      [email or web address]      password [email or web address]  If 7PM-7AM, please contact night-coverage [email or web address] Password [email or web address] 07/24/2014, 11:05 AM   LOS: 1 day   I have personally examined this patient and reviewed the entire database. I have reviewed the above note, made any necessary editorial changes, and agree with its content.  Lonia Blood, MD [facility]

## 2014-07-24 NOTE — Procedures (Signed)
Patient was seen on dialysis and the procedure was supervised. BFR 400 Via LUE AVF BP is 111/62.  Patient appears to be tolerating treatment well

## 2014-07-25 DIAGNOSIS — E119 Type 2 diabetes mellitus without complications: Secondary | ICD-10-CM

## 2014-07-25 DIAGNOSIS — I428 Other cardiomyopathies: Secondary | ICD-10-CM

## 2014-07-25 DIAGNOSIS — I10 Essential (primary) hypertension: Secondary | ICD-10-CM

## 2014-07-25 DIAGNOSIS — R55 Syncope and collapse: Secondary | ICD-10-CM

## 2014-07-25 LAB — CBC
HCT: 34.5 % — ABNORMAL LOW (ref 39.0–52.0)
Hemoglobin: 11.4 g/dL — ABNORMAL LOW (ref 13.0–17.0)
MCH: 35.2 pg — ABNORMAL HIGH (ref 26.0–34.0)
MCHC: 33 g/dL (ref 30.0–36.0)
MCV: 106.5 fL — AB (ref 78.0–100.0)
PLATELETS: 206 10*3/uL (ref 150–400)
RBC: 3.24 MIL/uL — ABNORMAL LOW (ref 4.22–5.81)
RDW: 15.9 % — ABNORMAL HIGH (ref 11.5–15.5)
WBC: 7.7 10*3/uL (ref 4.0–10.5)

## 2014-07-25 LAB — RENAL FUNCTION PANEL
ALBUMIN: 3 g/dL — AB (ref 3.5–5.2)
Anion gap: 17 — ABNORMAL HIGH (ref 5–15)
BUN: 21 mg/dL (ref 6–23)
CALCIUM: 7.3 mg/dL — AB (ref 8.4–10.5)
CO2: 23 mEq/L (ref 19–32)
CREATININE: 4.44 mg/dL — AB (ref 0.50–1.35)
Chloride: 97 mEq/L (ref 96–112)
GFR, EST AFRICAN AMERICAN: 14 mL/min — AB (ref >90)
GFR, EST NON AFRICAN AMERICAN: 12 mL/min — AB (ref >90)
Glucose, Bld: 151 mg/dL — ABNORMAL HIGH (ref 70–99)
PHOSPHORUS: 2.8 mg/dL (ref 2.3–4.6)
Potassium: 4.2 mEq/L (ref 3.7–5.3)
SODIUM: 137 meq/L (ref 137–147)

## 2014-07-25 LAB — GLUCOSE, CAPILLARY
GLUCOSE-CAPILLARY: 162 mg/dL — AB (ref 70–99)
Glucose-Capillary: 141 mg/dL — ABNORMAL HIGH (ref 70–99)
Glucose-Capillary: 152 mg/dL — ABNORMAL HIGH (ref 70–99)
Glucose-Capillary: 129 mg/dL — ABNORMAL HIGH (ref 70–99)

## 2014-07-25 MED ORDER — SODIUM CHLORIDE 0.9 % IV SOLN: 100.0000 mL | INTRAVENOUS | Status: DC | PRN

## 2014-07-25 MED ORDER — PENTAFLUOROPROP-TETRAFLUOROETH EX AERO: 1.0000 "application " | INHALATION_SPRAY | CUTANEOUS | Status: DC | PRN

## 2014-07-25 MED ORDER — LIDOCAINE-PRILOCAINE 2.5-2.5 % EX CREA: 1.0000 "application " | TOPICAL_CREAM | CUTANEOUS | Status: DC | PRN

## 2014-07-25 MED ORDER — GLIMEPIRIDE 1 MG PO TABS
1.0000 mg | ORAL_TABLET | Freq: Every day | ORAL | Status: DC
  Filled 2014-07-25 (×2): qty 1

## 2014-07-25 MED ORDER — LINAGLIPTIN 5 MG PO TABS
5.0000 mg | ORAL_TABLET | Freq: Every day | ORAL | Status: DC
  Administered 2014-07-25 – 2014-07-26 (×2): 5 mg via ORAL
  Filled 2014-07-25 (×2): qty 1

## 2014-07-25 MED ORDER — NEPRO/CARBSTEADY PO LIQD: 237.0000 mL | ORAL | Status: DC | PRN

## 2014-07-25 MED ORDER — INSULIN ASPART 100 UNIT/ML ~~LOC~~ SOLN
0.0000 [IU] | Freq: Three times a day (TID) | SUBCUTANEOUS | Status: DC
  Administered 2014-07-25: 1 [IU] via SUBCUTANEOUS

## 2014-07-25 MED ORDER — ALTEPLASE 2 MG IJ SOLR
2.0000 mg | Freq: Once | INTRAMUSCULAR | Status: AC | PRN
  Filled 2014-07-25: qty 2

## 2014-07-25 MED ORDER — HEPARIN SODIUM (PORCINE) 1000 UNIT/ML DIALYSIS
7000.0000 [IU] | Freq: Once | INTRAMUSCULAR | Status: AC
  Administered 2014-07-26: 7000 [IU] via INTRAVENOUS_CENTRAL
  Filled 2014-07-25: qty 7

## 2014-07-25 MED ORDER — LIDOCAINE HCL (PF) 1 % IJ SOLN: 5.0000 mL | INTRAMUSCULAR | Status: DC | PRN

## 2014-07-25 MED ORDER — HEPARIN SODIUM (PORCINE) 1000 UNIT/ML DIALYSIS
1000.0000 [IU] | INTRAMUSCULAR | Status: DC | PRN
  Filled 2014-07-25: qty 1

## 2014-07-25 MED ORDER — HEPARIN SODIUM (PORCINE) 1000 UNIT/ML DIALYSIS
7000.0000 [IU] | Freq: Once | INTRAMUSCULAR | Status: DC
  Filled 2014-07-25: qty 7

## 2014-07-25 MED ORDER — INSULIN ASPART 100 UNIT/ML ~~LOC~~ SOLN: 0.0000 [IU] | Freq: Every day | SUBCUTANEOUS | Status: DC

## 2014-07-25 NOTE — Progress Notes (Signed)
[facility]  ORLONDO HOLYCROSS UJW:[id number] DOB: [birth year] DOA: 07/23/2014 PCP: Barbette Reichmann, MD  Brief narrative: 68 year old BM PMHx ESRD on HD M/W/F, anemia and chronic renal disease, chronic respiratory failure (dependent), diabetes type 2 with renal complications, HTN, dilated cardiomyopathy Presented to the hospital after syncopal episode. EMS was called and patient's blood pressure was noted to be in the low 80s. He reported to the admitting physician that he has frequent dizziness and his BP is always low especially after dialysis treatment. His beta blocker had been stopped but a lower doses was restarted by his cardiologist in April.  In the ER he received 750 cc of saline with improvement in systolic blood pressure to greater than 90. Chest x-ray was without edema. Labs were unremarkable except for mildly elevated serum glucose of 212.  HPI/Subjective: Looks much better today-discussed possible deconditioning and need for PT/OT evaluation-also reviewed HgbA1c and glycemic control at home-pt reluctant to begin insulin  Assessment/Plan:    Syncope/Orthostatic hypotension Precipitated by volume depletion post dialysis in setting of use of antihypertensive-have permanently stopped beta blocker-no further recurrence of orthostatic hypotension postdialysis    CKD stage V requiring chronic dialysis Per nephrology-dialysis days are MWF    Dyspnea/Congestive dilated cardiomyopathy/EF 35-40% Evaluated by cardiology/Dr. Eden Emms March 2015 as an outpatient-concerns at that time were for dilated cardiomyopathy and possible pulmonary hypertension (as cause of low BP during dialysis)-echo did reveal dilated cardiomyopathy-patient readmitted to the hospital in April 2015 with chest pain and says underwent stress test after dc (8/15) which was "normal"-persistent dyspnea likely related to above causes but also could be related to deconditioning/PT and OT  evaluation pending-at some point as an out patient had been prescribed Imdur but this has also been discontinued and given presentation would not resume    Anemia in chronic renal disease Hemoglobin stable and actually somewhat high for this patient (with his baseline being around 9-10) which is likely indicative of volume depletion    Diabetes mellitus with renal complications Hemoglobin A1c was 7.1-begin Tradjenta-cont SSI-follow CBGs closely for next 24 hours to ensure no hypoglycemia before dc home since HD pt-needs prescription for new glucometer and test strips at discharge    HTN  BP remains soft so previously prescribed Lopressor has been discontinued and should not be resumed at dc  DVT prophylaxis: SCDs Code Status: Full Family Communication: No family at bedside Disposition Plan/Expected LOS: Transfer to floor-CM assisting in locating PCP  Consultants: Nephrology  Procedures: none  Antibiotics: none  Objective: Blood pressure 96/61, pulse 92, temperature 97.4 F (36.3 C), temperature source Oral, resp. rate 19, height  (1.88 m), weight 294 lb 15.6 oz (133.8 kg), SpO2 96.00%.  Intake/Output Summary (Last 24 hours) at 07/25/14 1112 Last data filed at 07/25/14 1033  Gross per 24 hour  Intake    986 ml  Output   2000 ml  Net  -1014 ml   Exam: Gen: No acute respiratory distress  Chest: Clear to auscultation bilaterally, room air Cardiac: Regular rate and rhythm, S1-S2, no rubs murmurs or gallops, no peripheral edema, no JVD Abdomen: Soft nontender nondistended without obvious hepatosplenomegaly, no ascites Extremities: Symmetrical in appearance without cyanosis, clubbing or effusion  Scheduled Meds:  Scheduled Meds: . allopurinol  100 mg Oral Daily  . atorvastatin  40 mg Oral q1800  . cinacalcet  60 mg Oral Q breakfast  . insulin aspart  0-15 Units Subcutaneous TID WC  . insulin aspart  0-5 Units Subcutaneous QHS  . linagliptin  5 mg Oral Daily  .  multivitamin  1 tablet Oral QHS  . sodium chloride  3 mL Intravenous Q12H   Data Reviewed: Basic Metabolic Panel:  Recent Labs Lab 07/23/14 1339 07/23/14 1409 07/24/14 0303 07/25/14 0308  NA 140 140 140 137  K 4.0 4.1 4.2 4.2  CL 99 98 99 97  CO2  --  GLUCOSE 212* 198* 144* 151*  BUN 33* 27* 33* 21  CREATININE 5.80* 5.60* 6.25* 4.44*  CALCIUM  --  7.6* 6.9* 7.3*  PHOS  --   --   --  2.8   Liver Function Tests:  Recent Labs Lab 07/23/14 1409 07/25/14 0308  AST 28  --   ALT 24  --   ALKPHOS 159*  --   BILITOT 1.0  --   PROT 7.8  --   ALBUMIN 3.5 3.0*   CBC:  Recent Labs Lab 07/23/14 1339 07/23/14 1409 07/24/14 0303 07/25/14 0308  WBC  --  7.8 7.3 7.7  NEUTROABS  --  5.5  --   --   HGB 14.3 11.9* 12.0* 11.4*  HCT 42.0 35.9* 36.2* 34.5*  MCV  --  107.8* 107.7* 106.5*  PLT  --  205 187 206   Cardiac Enzymes:  Recent Labs Lab 07/23/14 1409 07/23/14 2156 07/24/14 0303 07/24/14 0905  TROPONINI <0.30 <0.30 <0.30 <0.30   BNP (last 3 results)  Recent Labs  03/05/14 1853  PROBNP 7838.0*   CBG:  Recent Labs Lab 07/23/14 1326 07/24/14 2246  GLUCAP 193* 159*    Recent Results (from the past 240 hour(s))  CULTURE, BLOOD (ROUTINE X 2)     Status: None   Collection Time    07/23/14  4:40 PM      Result Value Ref Range Status   Specimen Description BLOOD RIGHT FOREARM   Final   Special Requests BOTTLES DRAWN AEROBIC AND ANAEROBIC 9CC   Final   Culture  Setup Time     Final   Value: 07/23/2014 22:33     Performed at [facility]   Culture     Final   Value:        BLOOD CULTURE RECEIVED NO GROWTH TO DATE CULTURE WILL BE HELD FOR 5 DAYS BEFORE ISSUING A FINAL NEGATIVE REPORT     Performed at [facility]   Report Status PENDING   Incomplete  CULTURE, BLOOD (ROUTINE X 2)     Status: None   Collection Time    07/23/14  4:50 PM      Result Value Ref Range Status   Specimen Description BLOOD ARM RIGHT   Final   Special  Requests BOTTLES DRAWN AEROBIC AND ANAEROBIC 10CC   Final   Culture  Setup Time     Final   Value: 07/23/2014 22:30     Performed at [facility]   Culture     Final   Value:        BLOOD CULTURE RECEIVED NO GROWTH TO DATE CULTURE WILL BE HELD FOR 5 DAYS BEFORE ISSUING A FINAL NEGATIVE REPORT     Performed at [facility]   Report Status PENDING   Incomplete  MRSA PCR SCREENING     Status: None   Collection Time    07/23/14  9:29 PM      Result Value Ref Range Status   MRSA by PCR NEGATIVE  NEGATIVE Final  Comment:            The GeneXpert MRSA Assay (FDA     approved for NASAL specimens     only), is one component of a     comprehensive MRSA colonization     surveillance program. It is not     intended to diagnose MRSA     infection nor to guide or     monitor treatment for     MRSA infections.     Studies:  Recent x-ray studies have been reviewed in detail by the Attending Physician  Time spent :  35 mins      Junious Silk, ANP [facility] Office  [phone] Pager [phone]  On-Call/Text Page:      [email or web address]      password [email or web address]  If 7PM-7AM, please contact night-coverage [email or web address] Password [email or web address] 07/25/2014, 11:12 AM   LOS: 2 days    Examined patient and discussed assessment and plan with ANP Revonda Standard, agree with above plan Patient with multiple complex medical problems> 30 minutes spent in direct patient care

## 2014-07-25 NOTE — Evaluation (Signed)
Physical Therapy Evaluation Patient Details Name: Nicolas Mccann MRN: [id number] DOB: Jan 05, 1946 Today's Date: 07/25/2014   History of Present Illness  68 year old male patient on dialysis for CKD who presented to the hospital after syncopal episode.history of DM.   Clinical Impression  Patient evaluated by Physical Therapy with no further acute PT needs identified. All education has been completed and the patient has no further questions. Pt has no history of imbalance. PT is signing off. Thank you for this referral.     Follow Up Recommendations No PT follow up    Equipment Recommendations  None recommended by PT    Recommendations for Other Services       Precautions / Restrictions        Mobility  Bed Mobility Overal bed mobility: Modified Independent                Transfers Overall transfer level: Needs assistance Equipment used: None Transfers: Sit to/from Stand Sit to Stand: Supervision Stand pivot transfers: Supervision       General transfer comment: supervision for safety only; no cues needed; denied dizziness  Ambulation/Gait Ambulation/Gait assistance: Supervision;Modified independent (Device/Increase time) Ambulation Distance (Feet): 75 Feet Assistive device: None Gait Pattern/deviations: Step-through pattern;Decreased stride length;Wide base of support Gait velocity: decr due to dyspnea   General Gait Details: wide stance due to body habitus and slow velocity due to dyspnea (pt reports this is baseline for him)  Careers information officer    Modified Rankin (Stroke Patients Only)       Balance Overall balance assessment: Independent         Standing balance support: No upper extremity supported Standing balance-Leahy Scale: Normal           Rhomberg - Eyes Opened: 30 (cannot fully put feet together due to body size) Rhomberg - Eyes Closed: 15 (became fatigued after activities and stopped test)                  Pertinent Vitals/Pain Dyspneic during ambulation (self-limited his distance); unable to get SaO2 monitor to register during activity  Pain Assessment: No/denies pain (denies acute pain; chronic arthritis pain)    Home Living Family/patient expects to be discharged to:: Private residence Living Arrangements: Spouse/significant other Available Help at Discharge: Family;Available 24 hours/day Type of Home: House Home Access: Ramped entrance     Home Layout: One level Home Equipment: Walker - 2 wheels      Prior Function Level of Independence: Independent         Comments: syncopal event while at the bank     Hand Dominance   Dominant Hand: Right    Extremity/Trunk Assessment   Upper Extremity Assessment: Defer to OT evaluation           Lower Extremity Assessment: Overall WFL for tasks assessed      Cervical / Trunk Assessment: Normal  Communication   Communication: No difficulties  Cognition Arousal/Alertness: Awake/alert Behavior During Therapy: WFL for tasks assessed/performed Overall Cognitive Status: Within Functional Limits for tasks assessed                      General Comments      Exercises        Assessment/Plan    PT Assessment Patent does not need any further PT services  PT Diagnosis     PT Problem List    PT Treatment Interventions  PT Goals (Current goals can be found in the Care Plan section) Acute Rehab PT Goals Patient Stated Goal: to go home PT Goal Formulation: No goals set, d/c therapy    Frequency     Barriers to discharge        Co-evaluation               End of Session Equipment Utilized During Treatment: Gait belt Activity Tolerance: Patient limited by fatigue Patient left: in bed;with call bell/phone within reach Nurse Communication: Mobility status;Other (comment) (d/c from PT)    Functional Assessment Tool Used: clinical reasoning Functional Limitation: Mobility: Walking and moving  around Mobility: Walking and Moving Around Current Status [phone]): At least 1 percent but less than 20 percent impaired, limited or restricted Mobility: Walking and Moving Around Goal Status [phone]): At least 1 percent but less than 20 percent impaired, limited or restricted Mobility: Walking and Moving Around Discharge Status [phone]): At least 1 percent but less than 20 percent impaired, limited or restricted    Time: 1558-1611 PT Time Calculation (min): 13 min   Charges:   PT Evaluation $Initial PT Evaluation Tier I: 1 Procedure     PT G Codes:   Functional Assessment Tool Used: clinical reasoning Functional Limitation: Mobility: Walking and moving around    Brena Windsor 07/25/2014, 4:21 PM Pager [phone]

## 2014-07-25 NOTE — Progress Notes (Signed)
Subjective:   Feeling well, no further syncopal events. Has not been up walking.  Objective Filed Vitals:   07/25/14 0450 07/25/14 0500 07/25/14 0510 07/25/14 0746  BP:  89/75  96/61  Pulse:  80  92  Temp: 97.8 F (36.6 C)   97.4 F (36.3 C)  TempSrc: Oral   Oral  Resp:    19  Height:      Weight:  133.8 kg (294 lb 15.6 oz)    SpO2:   96% 96%   Physical Exam General: alert and orineted. No acute distress.  Heart: RRR. No murmur Lungs: CTA. Unlabored  Abdomen: soft, obese. nontender +BS Extremities: no edema  Dialysis Access: L AVG +b/t   Dialysis Orders: MWF @ [facility]  4hr 131 kgs 2K/2Ca 7000 Heparin L AVF 450/800  No meds  Profile 4  Assessment/Plan:  1. syncope- orthostatic hypotension. Beta blocker held. troponin negative. Blood cultures no growth to date. Chest xray clear. Check orthostatic VS and ambulate 2. ESRD - MWF @ [facility]. HD tomorrow, recent increase in edw. K+ 4.2 3. Hypertension/volume - 96/61 assymptomatic holding metop 4. Anemia - hgb 11.4. No ESA and Fe. Watch cbc 5. Metabolic bone disease - corrceted Ca+ 8.1., use added Ca+ bath. Phos 2.8- hold binder. Cont sensipar last PTH 360  6. Nutrition - renal diet. Alb 3. Multi vit   Jetty Duhamel, NP [employer] [phone] 07/25/2014,9:21 AM  LOS: 2 days    Additional Objective Labs: Basic Metabolic Panel:  Recent Labs Lab 07/23/14 1409 07/24/14 0303 07/25/14 0308  NA 140 140 137  K 4.1 4.2 4.2  CL 98 99 97  CO2 24 21 23   GLUCOSE 198* 144* 151*  BUN 27* 33* 21  CREATININE 5.60* 6.25* 4.44*  CALCIUM 7.6* 6.9* 7.3*  PHOS  --   --  2.8   Liver Function Tests:  Recent Labs Lab 07/23/14 1409 07/25/14 0308  AST 28  --   ALT 24  --   ALKPHOS 159*  --   BILITOT 1.0  --   PROT 7.8  --   ALBUMIN 3.5 3.0*   No results found for this basename: LIPASE, AMYLASE,  in the last 168 hours CBC:  Recent Labs Lab 07/23/14 1409 07/24/14 0303 07/25/14 0308  WBC 7.8 7.3  7.7  NEUTROABS 5.5  --   --   HGB 11.9* 12.0* 11.4*  HCT 35.9* 36.2* 34.5*  MCV 107.8* 107.7* 106.5*  PLT 205 187 206   Blood Culture    Component Value Date/Time   SDES BLOOD ARM RIGHT 07/23/2014 1650   SPECREQUEST BOTTLES DRAWN AEROBIC AND ANAEROBIC 10CC 07/23/2014 1650   CULT  Value:        BLOOD CULTURE RECEIVED NO GROWTH TO DATE CULTURE WILL BE HELD FOR 5 DAYS BEFORE ISSUING A FINAL NEGATIVE REPORT Performed at [facility] 07/23/2014 1650   REPTSTATUS PENDING 07/23/2014 1650    Cardiac Enzymes:  Recent Labs Lab 07/23/14 1409 07/23/14 2156 07/24/14 0303 07/24/14 0905  TROPONINI <0.30 <0.30 <0.30 <0.30   CBG:  Recent Labs Lab 07/23/14 1326 07/24/14 2246  GLUCAP 193* 159*   Iron Studies: No results found for this basename: IRON, TIBC, TRANSFERRIN, FERRITIN,  in the last 72 hours @lablastinr3 @ Studies/Results: Dg Chest 2 View  07/23/2014   CLINICAL DATA:  Short of breath  EXAM: CHEST  2 VIEW  COMPARISON:  03/05/2014  FINDINGS: Cardiomegaly. Normal vascularity. Low volumes. Bibasilar atelectasis. No pleural effusion. No pneumothorax. Central  vascular crowding.  IMPRESSION: Cardiomegaly without edema.  Bibasilar atelectasis.   Electronically Signed   By: Maryclare Bean M.D.   On: 07/23/2014 17:27   Medications:   . allopurinol  100 mg Oral Daily  . atorvastatin  40 mg Oral q1800  . cinacalcet  60 mg Oral Q breakfast  . insulin aspart  0-15 Units Subcutaneous TID WC  . insulin aspart  0-5 Units Subcutaneous QHS  . lanthanum  1,000 mg Oral TID WC  . multivitamin  1 tablet Oral QHS  . sodium chloride  3 mL Intravenous Q12H    I have seen and examined this patient and agree with plan as outlined by B. Barnetta Chapel, NP.  Cont with HD qMWF and agree with holding beta blocker.  BP stable. Kort Stettler A,MD 07/25/2014 11:24 AM  3

## 2014-07-25 NOTE — Progress Notes (Signed)
Pt transferred from [facility] to 6E03 via wheelchair.  Pt A&OX4.  VSS.  Denies pain, pleasant.

## 2014-07-25 NOTE — Progress Notes (Signed)
Occupational Therapy Evaluation Patient Details Name: Nicolas Mccann MRN: [id number] DOB: 1946-06-11 Today's Date: 07/25/2014    History of Present Illness 68 year old male patient on dialysis for CKD who presented to the hospital after syncopal episode.history of DM.    Clinical Impression   PTA, pt was independent with ADL and mobility. HD MWF - taken by his family. Pt appears close to baseline regarding his ADL status. No c/o dizziness with activity. BP 97/64. Pt does have RW at home he can use but will benefit from use of 3 in1 to increase safety and reduce fall risk with ADL. Pt demonstrates understanding of use of 3 in 1. No further OT needs. Pt states he will have 24/7 A after D/C. Pt safe to D/C home with family when medically stable from OT standpoint.     Follow Up Recommendations  No OT follow up;Supervision - Intermittent    Equipment Recommendations  3 in 1 bedside comode    Recommendations for Other Services  PT consult     Precautions / Restrictions        Mobility Bed Mobility Overal bed mobility: Modified Independent                Transfers Overall transfer level: Needs assistance Equipment used: 1 person hand held assist Transfers: Sit to/from Stand;Stand Pivot Transfers Sit to Stand: Supervision Stand pivot transfers: Supervision       General transfer comment: S provided. Pt states he is tired from "being in bed".    Balance Overall balance assessment: No apparent balance deficits (not formally assessed)                                          ADL Overall ADL's : Needs assistance/impaired         Upper Body Bathing: Set up   Lower Body Bathing: Set up;Sit to/from stand   Upper Body Dressing : Set up;Sitting   Lower Body Dressing: Set up;Sit to/from stand   Toilet Transfer: Supervision/safety;Stand-pivot;BSC   Toileting- Architect and Hygiene: Modified independent       Functional mobility during  ADLs: Supervision/safety General ADL Comments: discussed home set up and ADL status. Pt has family to assist 24/7. Has difficulty maintaining standing for showers due to fatigue, especially after dialysis. discussed activity simplification/ Energy conservation. Pt will benefit from 3 in 1Ambulating @ room @ S level. Pt began to initially use RW, then put it aside and was walking without it @ S level.      Vision    baseline                 Perception     Praxis      Pertinent Vitals/Pain Pain Assessment: No/denies pain     Hand Dominance Right   Extremity/Trunk Assessment Upper Extremity Assessment Upper Extremity Assessment: Overall WFL for tasks assessed   Lower Extremity Assessment Lower Extremity Assessment: Defer to PT evaluation   Cervical / Trunk Assessment Cervical / Trunk Assessment: Normal   Communication Communication Communication: No difficulties   Cognition Arousal/Alertness: Awake/alert Behavior During Therapy: WFL for tasks assessed/performed Overall Cognitive Status: Within Functional Limits for tasks assessed                     General Comments       Exercises       Shoulder Instructions  Home Living Family/patient expects to be discharged to:: Private residence Living Arrangements: Spouse/significant other Available Help at Discharge: Family;Available 24 hours/day Type of Home: House Home Access: Ramped entrance     Home Layout: One level     Bathroom Shower/Tub: Producer, television/film/video: Handicapped height Bathroom Accessibility: Yes How Accessible: Accessible via walker Home Equipment: Walker - 2 wheels          Prior Functioning/Environment Level of Independence: Independent        Comments: syncopal event while at the bank    OT Diagnosis:     OT Problem List:     OT Treatment/Interventions:      OT Goals(Current goals can be found in the care plan section) Acute Rehab OT Goals Patient  Stated Goal: to go home OT Goal Formulation:  (eval only) Potential to Achieve Goals: Good  OT Frequency:     Barriers to D/C:            Co-evaluation              End of Session Equipment Utilized During Treatment: Gait belt Nurse Communication: Mobility status  Activity Tolerance: Patient tolerated treatment well Patient left: in chair;with call bell/phone within reach   Time: 1415-1445 OT Time Calculation (min): 30 min Charges:  OT General Charges $OT Visit: 1 Procedure OT Evaluation $Initial OT Evaluation Tier I: 1 Procedure OT Treatments $Self Care/Home Management : 8-22 mins G-Codes: OT G-codes **NOT FOR INPATIENT CLASS** Functional Assessment Tool Used: clinical judgement Functional Limitation: Self care Self Care Current Status (R1540): At least 1 percent but less than 20 percent impaired, limited or restricted Self Care Goal Status (G8676): At least 1 percent but less than 20 percent impaired, limited or restricted Self Care Discharge Status [phone]): At least 1 percent but less than 20 percent impaired, limited or restricted  Nicolas Mccann,Nicolas Mccann 07/25/2014, 3:00 PM   [facility], OTR/L  [phone] 07/25/2014

## 2014-07-25 NOTE — Progress Notes (Signed)
PT Cancellation Note  Patient Details Name: Nicolas Mccann MRN: [id number] DOB: [birth year]   Cancelled Treatment:    Reason Eval/Treat Not Completed: Patient at procedure or test/unavailable. Pt receiving education re: diabetes. Will attempt later today.   Amardeep Beckers 07/25/2014, 1:37 PM Pager [phone]

## 2014-07-25 NOTE — Progress Notes (Addendum)
Inpatient Diabetes Program Recommendations  AACE/ADA: New Consensus Statement on Inpatient Glycemic Control (2013)  Target Ranges:  Prepandial:   less than 140 mg/dL      Peak postprandial:   less than 180 mg/dL (1-2 hours)      Critically ill patients:  140 - 180 mg/dL   Results for CORWYN, ARMANDO (MRN [id number]) as of 07/25/2014 10:20  Ref. Range 07/23/2014 13:26 07/24/2014 22:46  Glucose-Capillary Latest Range: 70-99 mg/dL 938 (H) 182 (H)   Diabetes history: DM2 Outpatient Diabetes medications: none (diet controlled) Current orders for Inpatient glycemic control: Novolog 0-15 units AC, Novolog 0-5 units HS  Inpatient Diabetes Program Recommendations Correction (SSI): Patient has a history of diabetes. Please order CBGs with Novolog sensitive correction scale. Oral Agents: Noted Amaryl 1 mg daily was ordered this morning. Recommend discontinuing Amaryl and ordering Tradjenta 5 mg daily instead for glycemic control.  Note: Discussed with Junious Silk, NP. Will see patient today.  07/25/14-Spoke with patient about diabetes and home regimen for diabetes control. Patient reports that he does not have a PCP and he does not take any medications for diabetes as an outpatient.  According to the patient he has had diabetes for "a very long time" and he states that he has been on oral medications for diabetes in the past. He states that he needs a PCP because he will have one specialist doctor put him on one thing and another specialist doctor take him off and put him on something else. Placed order for Case Manager consult for help in finding a PCP. During our conversation as we talked about diabetes he would start talking about blood pressure instead of blood glucose and had to be redirected back to blood glucose and diabetes control.  Inquired about knowledge about A1C and patient reports that he does not know what an A1C is. Discussed A1C results (7.1% on 07/24/14) and explained what an A1C is, basic  pathophysiology of DM Type 2, basic home care, importance of checking CBGs and maintaining good CBG control to prevent long-term and short-term complications. Discussed impact of nutrition, exercise, stress, sickness, and medications on diabetes control. Explained that patient was started on Tradjenta and explained medication and provided patient with written information on the medication. Discussed normal glucose values, hyperglycemia, and hypoglycemia. Explained treatment for hypoglycemia and provided written information with signs and symptoms of hypoglycemia along with proper treatment. Patient states that he has a glucometer but states that it is old and he is not sure where all his supplies are since he does not currently check his glucose. Patient requested prescription for a new glucometer and testing supplies (made note on physician sticky note and also informed Case Manager). Asked that patient check his glucose 3-4 times per day and keep a log of blood glucose and for him to take his log with him to his follow up visits.  Patient verbalized understanding of information discussed and he states that he has no further questions at this time related to diabetes.   Thanks, Orlando Penner, RN, MSN, CCRN Diabetes Coordinator Inpatient Diabetes Program [phone] (Team Pager) [phone] ([facility] office) [phone] [facility] office)

## 2014-07-26 LAB — RENAL FUNCTION PANEL
Albumin: 3.2 g/dL — ABNORMAL LOW (ref 3.5–5.2)
Anion gap: 17 — ABNORMAL HIGH (ref 5–15)
BUN: 37 mg/dL — ABNORMAL HIGH (ref 6–23)
CO2: 22 meq/L (ref 19–32)
Calcium: 7.2 mg/dL — ABNORMAL LOW (ref 8.4–10.5)
Chloride: 95 mEq/L — ABNORMAL LOW (ref 96–112)
Creatinine, Ser: 6.38 mg/dL — ABNORMAL HIGH (ref 0.50–1.35)
GFR calc Af Amer: 9 mL/min — ABNORMAL LOW (ref >90)
GFR, EST NON AFRICAN AMERICAN: 8 mL/min — AB (ref >90)
Glucose, Bld: 182 mg/dL — ABNORMAL HIGH (ref 70–99)
Phosphorus: 3 mg/dL (ref 2.3–4.6)
Potassium: 4.3 mEq/L (ref 3.7–5.3)
Sodium: 134 mEq/L — ABNORMAL LOW (ref 137–147)

## 2014-07-26 LAB — GLUCOSE, CAPILLARY: Glucose-Capillary: 115 mg/dL — ABNORMAL HIGH (ref 70–99)

## 2014-07-26 MED ORDER — PENTAFLUOROPROP-TETRAFLUOROETH EX AERO
INHALATION_SPRAY | CUTANEOUS | Status: AC
  Administered 2014-07-26: 09:00:00
  Filled 2014-07-26: qty 103.5

## 2014-07-26 MED ORDER — RELION CONFIRM GLUCOSE MONITOR W/DEVICE KIT: 1.0000 | PACK | Freq: Three times a day (TID) | Status: DC

## 2014-07-26 MED ORDER — NEPRO/CARBSTEADY PO LIQD: 237.0000 mL | ORAL | Status: AC | PRN

## 2014-07-26 MED ORDER — GLUCOSE BLOOD VI STRP: ORAL_STRIP | Status: DC

## 2014-07-26 MED ORDER — LINAGLIPTIN 5 MG PO TABS: 5.0000 mg | ORAL_TABLET | Freq: Every day | ORAL | Status: DC

## 2014-07-26 NOTE — Discharge Summary (Signed)
Physician Discharge Summary  Nicolas Mccann SWH:[id number] DOB: June 17, 1946 DOA: 07/23/2014  PCP: Nicolas Harrier, Mccann  Admit date: 07/23/2014 Discharge date: 07/26/2014  Time spent: > 30 minutes  Recommendations for Outpatient Follow-up:  1. Follow up with Nicolas Mccann for diabetes management-appointment scheduled by case manager  2. Call Nicolas Mccann to schedule post hospital appointment ASAP 3. Beta blocker stopped due to symptomatic orthostatic hypotension 4. 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 EMS was called and patient's blood pressure was noted to be in the low 80s. He reported to the admitting physician that he has frequent dizziness and his BP is always low especially after dialysis treatment. His beta blocker had been stopped but a lower doses was restarted by his cardiologist in April.   In the ER he received 750 cc of saline with improvement in systolic blood pressure to greater than 90. Chest x-ray was without edema. Labs were unremarkable except for mildly elevated serum  glucose of 212.  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 He underwent stress test after dc (8/15) which was "normal. Suspected persistent dyspnea likely related to above causes but also could be related to deconditioning so PT and OT evaluation done and no needs identified. At some point as an out patient had been prescribed Imdur but this has also been discontinued and given presentation would not resume   Anemia in chronic renal disease  Hemoglobin stable and actually somewhat high for this patient (with his baseline being around 9-10) which was likely indicative of volume depletion at presenattion  Diabetes mellitus with renal complications  Hemoglobin A1c was 7.1-began Tradjenta this admission. Was given prescriptions for new glucometer and test strips at discharge   HTN  BP remains soft so previously prescribed Lopressor has been discontinued and should not be resumed at dc  Procedures:  None  Consultations:  Nicolas Mccann  Discharge Exam: Filed Vitals:   07/26/14 1230  BP: 104/54  Pulse: 48  Temp:   Resp:    Gen: No acute respiratory distress  Chest: Clear to auscultation bilaterally, room air  Cardiac: Regular rate and rhythm, S1-S2,  no rubs murmurs or gallops, no peripheral edema, no JVD  Abdomen: Soft nontender nondistended without obvious  hepatosplenomegaly, no ascites  Extremities: Symmetrical in appearance without cyanosis, clubbing or effusion   Current Discharge Medication List    START taking these medications   Details  Blood Glucose Monitoring Suppl (RELION CONFIRM GLUCOSE MONITOR) W/DEVICE KIT 1 Device by Does not apply route 4 (four) times daily -  before meals and at bedtime. Qty: 1 kit, Refills: 0    glucose blood (RELION CONFIRM/MICRO TEST) test strip Use as instructed Qty: 100 each, Refills: 12    linagliptin (TRADJENTA) 5 MG TABS tablet Take 1 tablet (5 mg total) by mouth daily. Qty: 30 tablet, Refills: 0    Nutritional Supplements (FEEDING SUPPLEMENT, NEPRO CARB STEADY,) LIQD Take 237 mLs by mouth as needed (missed meal during dialysis.). Qty: 6 Can, Refills: prn      CONTINUE these medications which have NOT CHANGED   Details  allopurinol (ZYLOPRIM) 100 MG tablet Take 100 mg by mouth daily.    amitriptyline (ELAVIL) 25 MG tablet Take 25 mg by mouth at bedtime as needed for sleep.     atorvastatin (LIPITOR) 40 MG tablet Take 1 tablet (40 mg total) by mouth daily at 6 PM. Qty: 30 tablet, Refills: 2    b complex-vitamin c-folic acid (NEPHRO-VITE) 0.8 MG TABS tablet Take 0.8 mg by mouth daily.     cinacalcet (SENSIPAR) 30 MG tablet Take 60 mg by mouth daily.    lanthanum (FOSRENOL) 1000 MG chewable tablet Chew 1,000 mg by mouth 2 (two) times daily with a meal.    lidocaine (XYLOCAINE) 2 % solution Apply to affected area using cotton swab every 3 hrs as needed for pain Qty: 100 mL, Refills: 0      STOP taking these medications     metoprolol tartrate (LOPRESSOR) 25 MG tablet        No Known Allergies Follow-up Information   Schedule an appointment as soon as possible for a visit with Nicolas Mccann.   Specialty:  Cardiology   Contact information:   [place] [address] [phone]       Follow up with [facility]    . (Case  manager is scheduling you an appointment )    Contact information:   Nicolas Mccann [id number] [phone]     Significant Diagnostic Studies: Dg Chest 2 View  07/23/2014   CLINICAL DATA:  Short of breath  EXAM: CHEST  2 VIEW  COMPARISON:  03/05/2014  FINDINGS: Cardiomegaly. Normal vascularity. Low volumes. Bibasilar atelectasis. No pleural effusion. No pneumothorax. Central vascular crowding.  IMPRESSION: Cardiomegaly without edema.  Bibasilar atelectasis.   Electronically Signed   By: Maryclare Bean M.D.   On: 07/23/2014 17:27    Microbiology: Recent Results (from the past 240 hour(s))  CULTURE, BLOOD (ROUTINE X 2)     Status: None   Collection Time    07/23/14  4:40 PM      Result Value Ref Range Status   Specimen Description BLOOD RIGHT FOREARM   Final   Special Requests BOTTLES DRAWN AEROBIC AND ANAEROBIC [facility]   Final   Culture  Setup Time     Final   Value: 07/23/2014 22:33     Performed at [facility]   Culture     Final   Value:        BLOOD CULTURE RECEIVED NO GROWTH TO DATE  CULTURE WILL BE HELD FOR 5 DAYS BEFORE ISSUING A FINAL NEGATIVE REPORT     Performed at [facility]   Report Status PENDING   Incomplete  CULTURE, BLOOD (ROUTINE X 2)     Status: None   Collection Time    07/23/14  4:50 PM      Result Value Ref Range Status   Specimen Description BLOOD ARM RIGHT   Final   Special Requests BOTTLES DRAWN AEROBIC AND ANAEROBIC 10CC   Final   Culture  Setup Time     Final   Value: 07/23/2014 22:30     Performed at [facility]   Culture     Final   Value:        BLOOD CULTURE RECEIVED NO GROWTH TO DATE CULTURE WILL BE HELD FOR 5 DAYS BEFORE ISSUING A FINAL NEGATIVE REPORT     Performed at [facility]   Report Status PENDING   Incomplete  MRSA PCR SCREENING     Status: None   Collection Time    07/23/14  9:29 PM      Result Value Ref Range Status   MRSA by PCR NEGATIVE  NEGATIVE Final   Comment:            The  GeneXpert MRSA Assay (FDA     approved for NASAL specimens     only), is one component of a     comprehensive MRSA colonization     surveillance program. It is not     intended to diagnose MRSA     infection nor to guide or     monitor treatment for     MRSA infections.     Labs: Basic Metabolic Panel:  Recent Labs Lab 07/23/14 1339 07/23/14 1409 07/24/14 0303 07/25/14 0308 07/26/14 0500  NA 140 140 140 137 134*  K 4.0 4.1 4.2 4.2 4.3  CL 99 98 99 97 95*  CO2  --  24 21 23 22   GLUCOSE 212* 198* 144* 151* 182*  BUN 33* 27* 33* 21 37*  CREATININE 5.80* 5.60* 6.25* 4.44* 6.38*  CALCIUM  --  7.6* 6.9* 7.3* 7.2*  PHOS  --   --   --  2.8 3.0   Liver Function Tests:  Recent Labs Lab 07/23/14 1409 07/25/14 0308 07/26/14 0500  AST 28  --   --   ALT 24  --   --   ALKPHOS 159*  --   --   BILITOT 1.0  --   --   PROT 7.8  --   --   ALBUMIN 3.5 3.0* 3.2*   CBC:  Recent Labs Lab 07/23/14 1339 07/23/14 1409 07/24/14 0303 07/25/14 0308  WBC  --  7.8 7.3 7.7  NEUTROABS  --  5.5  --   --   HGB 14.3 11.9* 12.0* 11.4*  HCT 42.0 35.9* 36.2* 34.5*  MCV  --  107.8* 107.7* 106.5*  PLT  --  205 187 206   Cardiac Enzymes:  Recent Labs Lab 07/23/14 1409 07/23/14 2156 07/24/14 0303 07/24/14 0905  TROPONINI <0.30 <0.30 <0.30 <0.30   BNP: BNP (last 3 results)  Recent Labs  03/05/14 1853  PROBNP 7838.0*   CBG:  Recent Labs Lab 07/25/14 0745 07/25/14 1135 07/25/14 1631 07/25/14 2003 07/26/14 0756  GLUCAP 141* 152* 129* 162* 115*   Signed:  ELLIS,ALLISON L. ANP [facility] 07/26/2014, 1:00 PM  I have personally examined this patient and reviewed the entire database. I  have reviewed the above note, made any necessary editorial changes, and agree with its content.  Cherene Altes, Mccann [facility]

## 2014-07-26 NOTE — Progress Notes (Signed)
Pt in dialysis at this time 

## 2014-07-26 NOTE — Progress Notes (Signed)
Subjective:  No complaints, no dizziness since admission  Objective: Vital signs in last 24 hours: Temp:  [97.6 F (36.4 C)-98.4 F (36.9 C)] 97.8 F (36.6 C) (09/18 0500) Pulse Rate:  [36-90] 90 (09/18 0500) Resp:  [18-25] 18 (09/18 0500) BP: (84-112)/(51-71) 84/62 mmHg (09/18 0500) SpO2:  [92 %-100 %] 96 % (09/18 0500) Weight change:   Intake/Output from previous day: 09/17 0701 - 09/18 0700 In: 466 [P.O.:460; I.V.:6] Out: 0  Intake/Output this shift:   EXAM: General appearance:  Alert, in no apparent distress  Resp:  CTA without rales, rhonchi, or wheezes Cardio:  RRR without murmur or rub GI:  Obese, + BS, soft and nontender Extremities:  No edema Access:  AVG @ LUA with + bruit  Lab Results:  Recent Labs  07/24/14 0303 07/25/14 0308  WBC 7.3 7.7  HGB 12.0* 11.4*  HCT 36.2* 34.5*  PLT 187 206   BMET:  Recent Labs  07/23/14 1409 07/24/14 0303 07/25/14 0308  NA 140 140 137  K 4.1 4.2 4.2  CL 98 99 97  CO2 24 21 23   GLUCOSE 198* 144* 151*  BUN 27* 33* 21  CREATININE 5.60* 6.25* 4.44*  CALCIUM 7.6* 6.9* 7.3*  ALBUMIN 3.5  --  3.0*   No results found for this basename: PTH,  in the last 72 hours Iron Studies: No results found for this basename: IRON, TIBC, TRANSFERRIN, FERRITIN,  in the last 72 hours  Studies/Results: No results found.  Dialysis Orders: MWF @ [facility]  4hr 131 kgs 2K/2Ca 7000 Heparin L AVF 450/800  No meds  Profile 4  Assessment/Plan: 1. Syncope - likely sec to orthostatic hypotension; BC with no growth, troponins negative, CXR negative; BB on hold, ambulating well. 2. ESRD - HD on MWF @ [facility], K  4.2.  HD pending today. 3. HTN/Volume - BP most recently 84/62, asymptomatic, Metoprolol on hold; EDW increased to 131 last wk, currently 2.8 L over. 4. Anemia - Hgb 11.4, no ESA or Fe. 5. Sec HPT - Ca 7.3 (8.1 corrected), P 2.8; Sensipar 60 mcg qd, no binders. 6. Nutrition - Alb 3, renal diet, multivitamin.   LOS: 3 days    LYLES,CHARLES 07/26/2014,8:12 AM   I have seen and examined this patient and agree with plan as outlined by C. Lyles, PA-C.  Pt feels better and hopefully will be stable for discharge after HD per primary svc.Marland Kitchen Ifeoluwa Bartz A,MD 07/26/2014 8:55 AM

## 2014-07-26 NOTE — Procedures (Signed)
Patient was seen on dialysis and the procedure was supervised. BFR 400 Via LAVF BP is 84/62.  Patient appears to be tolerating treatment well and feels better off of metoprolol.

## 2014-07-26 NOTE — Care Management Note (Signed)
    Page 1 of 1   07/26/2014     7:09:48 AM CARE MANAGEMENT NOTE 07/26/2014  Patient:  Nicolas Mccann, Nicolas Mccann   Account Number:  [id number]  Date Initiated:  07/25/2014  Documentation initiated by:  Alyssamarie Mounsey  Subjective/Objective Assessment:   dx orthostatic hypotension; lives with spouse     DC Planning Services  CM consult  PCP issues      Status of service:  In process, will continue to follow  Per UR Regulation:  Reviewed for med. necessity/level of care/duration of stay  Comments:  07/25/14 1410 Deari Sessler RN MSN BSN CCM Pt has no PCP and requests assistance with an appt @ [facility].  TC to Center, voice mail message left requesting return call and providing pt's MRN.

## 2014-07-26 NOTE — Discharge Instructions (Signed)
Primary Care Physician (with insurance coverage) To obtain a Primary Care Physician you can call the toll free number on your insurance card and request a providers list for your area. You may also visit the insurance provider online.  Or you can contact [facility] Connect.   [facility] Connect [phone] or Physician Referral service [phone] choose option #2 or 1 [phone]     Blood Glucose Monitoring Monitoring your blood glucose (also know as blood sugar) helps you to manage your diabetes. It also helps you and your health care provider monitor your diabetes and determine how well your treatment plan is working. WHY SHOULD YOU MONITOR YOUR BLOOD GLUCOSE?  It can help you understand how food, exercise, and medicine affect your blood glucose.  It allows you to know what your blood glucose is at any given moment. You can quickly tell if you are having low blood glucose (hypoglycemia) or high blood glucose (hyperglycemia).  It can help you and your health care provider know how to adjust your medicines.  It can help you understand how to manage an illness or adjust medicine for exercise. WHEN SHOULD YOU TEST? Your health care provider will help you decide how often you should check your blood glucose. This may depend on the type of diabetes you have, your diabetes control, or the types of medicines you are taking. Be sure to write down all of your blood glucose readings so that this information can be reviewed with your health care provider. See below for examples of testing times that your health care provider may suggest. Type 1 Diabetes  Test 4 times a day if you are in good control, using an insulin pump, or perform multiple daily injections.  If your diabetes is not well controlled or if you are sick, you may need to monitor more often.  It is a good idea to also monitor:  Before and after exercise.  Between meals and 2 hours after a meal.  Occasionally between 2:00 a.m. and  3:00 a.m. Type 2 Diabetes  It can vary with each person, but generally, if you are on insulin, test 4 times a day.  If you take medicines by mouth (orally), test 2 times a day.  If you are on a controlled diet, test once a day.  If your diabetes is not well controlled or if you are sick, you may need to monitor more often. HOW TO MONITOR YOUR BLOOD GLUCOSE Supplies Needed  Blood glucose meter.  Test strips for your meter. Each meter has its own strips. You must use the strips that go with your own meter.  A pricking needle (lancet).  A device that holds the lancet (lancing device).  A journal or log book to write down your results. Procedure  Wash your hands with soap and water. Alcohol is not preferred.  Prick the side of your finger (not the tip) with the lancet.  Gently milk the finger until a small drop of blood appears.  Follow the instructions that come with your meter for inserting the test strip, applying blood to the strip, and using your blood glucose meter. Other Areas to Get Blood for Testing Some meters allow you to use other areas of your body (other than your finger) to test your blood. These areas are called alternative sites. The most common alternative sites are:  The forearm.  The thigh.  The back area of the lower leg.  The palm of the hand. The blood  flow in these areas is slower. Therefore, the blood glucose values you get may be delayed, and the numbers are different from what you would get from your fingers. Do not use alternative sites if you think you are having hypoglycemia. Your reading will not be accurate. Always use a finger if you are having hypoglycemia. Also, if you cannot feel your lows (hypoglycemia unawareness), always use your fingers for your blood glucose checks. ADDITIONAL TIPS FOR GLUCOSE MONITORING  Do not reuse lancets.  Always carry your supplies with you.  All blood glucose meters have a 24-hour "hotline" number to call if  you have questions or need help.  Adjust (calibrate) your blood glucose meter with a control solution after finishing a few boxes of strips. BLOOD GLUCOSE RECORD KEEPING It is a good idea to keep a daily record or log of your blood glucose readings. Most glucose meters, if not all, keep your glucose records stored in the meter. Some meters come with the ability to download your records to your home computer. Keeping a record of your blood glucose readings is especially helpful if you are wanting to look for patterns. Make notes to go along with the blood glucose readings because you might forget what happened at that exact time. Keeping good records helps you and your health care provider to work together to achieve good diabetes management.  Document Released: 10/28/2003 Document Revised: 03/11/2014 Document Reviewed: 03/19/2013 [facility] Patient Information 2015 [place], [place]. This information is not intended to replace advice given to you by your health care provider. Make sure you discuss any questions you have with your health care provider.  Diabetes and Exercise Exercising regularly is important. It is not just about losing weight. It has many health benefits, such as:  Improving your overall fitness, flexibility, and endurance.  Increasing your bone density.  Helping with weight control.  Decreasing your body fat.  Increasing your muscle strength.  Reducing stress and tension.  Improving your overall health. People with diabetes who exercise gain additional benefits because exercise:  Reduces appetite.  Improves the body's use of blood sugar (glucose).  Helps lower or control blood glucose.  Decreases blood pressure.  Helps control blood lipids (such as cholesterol and triglycerides).  Improves the body's use of the hormone insulin by:  Increasing the body's insulin sensitivity.  Reducing the body's insulin needs.  Decreases the risk for heart disease because  exercising:  Lowers cholesterol and triglycerides levels.  Increases the levels of good cholesterol (such as high-density lipoproteins [HDL]) in the body.  Lowers blood glucose levels. YOUR ACTIVITY PLAN  Choose an activity that you enjoy and set realistic goals. Your health care provider or diabetes educator can help you make an activity plan that works for you. Exercise regularly as directed by your health care provider. This includes:  Performing resistance training twice a week such as push-ups, sit-ups, lifting weights, or using resistance bands.  Performing 150 minutes of cardio exercises each week such as walking, running, or playing sports.  Staying active and spending no more than 90 minutes at one time being inactive. Even short bursts of exercise are good for you. Three 10-minute sessions spread throughout the day are just as beneficial as a single 30-minute session. Some exercise ideas include:  Taking the dog for a walk.  Taking the stairs instead of the elevator.  Dancing to your favorite song.  Doing an exercise video.  Doing your favorite exercise with a friend. RECOMMENDATIONS FOR EXERCISING WITH TYPE  1 OR TYPE 2 DIABETES   Check your blood glucose before exercising. If blood glucose levels are greater than 240 mg/dL, check for urine ketones. Do not exercise if ketones are present.  Avoid injecting insulin into areas of the body that are going to be exercised. For example, avoid injecting insulin into:  The arms when playing tennis.  The legs when jogging.  Keep a record of:  Food intake before and after you exercise.  Expected peak times of insulin action.  Blood glucose levels before and after you exercise.  The type and amount of exercise you have done.  Review your records with your health care provider. Your health care provider will help you to develop guidelines for adjusting food intake and insulin amounts before and after exercising.  If you  take insulin or oral hypoglycemic agents, watch for signs and symptoms of hypoglycemia. They include:  Dizziness.  Shaking.  Sweating.  Chills.  Confusion.  Drink plenty of water while you exercise to prevent dehydration or heat stroke. Body water is lost during exercise and must be replaced.  Talk to your health care provider before starting an exercise program to make sure it is safe for you. Remember, almost any type of activity is better than none. Document Released: 01/15/2004 Document Revised: 03/11/2014 Document Reviewed: 04/03/2013 [facility] Patient Information 2015 [place], [place]. This information is not intended to replace advice given to you by your health care provider. Make sure you discuss any questions you have with your health care provider.  Diabetes and Foot Care Diabetes may cause you to have problems because of poor blood supply (circulation) to your feet and legs. This may cause the skin on your feet to become thinner, break easier, and heal more slowly. Your skin may become dry, and the skin may peel and crack. You may also have nerve damage in your legs and feet causing decreased feeling in them. You may not notice minor injuries to your feet that could lead to infections or more serious problems. Taking care of your feet is one of the most important things you can do for yourself.  HOME CARE INSTRUCTIONS  Wear shoes at all times, even in the house. Do not go barefoot. Bare feet are easily injured.  Check your feet daily for blisters, cuts, and redness. If you cannot see the bottom of your feet, use a mirror or ask someone for help.  Wash your feet with warm water (do not use hot water) and mild soap. Then pat your feet and the areas between your toes until they are completely dry. Do not soak your feet as this can dry your skin.  Apply a moisturizing lotion or petroleum jelly (that does not contain alcohol and is unscented) to the skin on your feet and to dry,  brittle toenails. Do not apply lotion between your toes.  Trim your toenails straight across. Do not dig under them or around the cuticle. File the edges of your nails with an emery board or nail file.  Do not cut corns or calluses or try to remove them with medicine.  Wear clean socks or stockings every day. Make sure they are not too tight. Do not wear knee-high stockings since they may decrease blood flow to your legs.  Wear shoes that fit properly and have enough cushioning. To break in new shoes, wear them for just a few hours a day. This prevents you from injuring your feet. Always look in your shoes before you put  them on to be sure there are no objects inside.  Do not cross your legs. This may decrease the blood flow to your feet.  If you find a minor scrape, cut, or break in the skin on your feet, keep it and the skin around it clean and dry. These areas may be cleansed with mild soap and water. Do not cleanse the area with peroxide, alcohol, or iodine.  When you remove an adhesive bandage, be sure not to damage the skin around it.  If you have a wound, look at it several times a day to make sure it is healing.  Do not use heating pads or hot water bottles. They may burn your skin. If you have lost feeling in your feet or legs, you may not know it is happening until it is too late.  Make sure your health care provider performs a complete foot exam at least annually or more often if you have foot problems. Report any cuts, sores, or bruises to your health care provider immediately. SEEK MEDICAL CARE IF:   You have an injury that is not healing.  You have cuts or breaks in the skin.  You have an ingrown nail.  You notice redness on your legs or feet.  You feel burning or tingling in your legs or feet.  You have pain or cramps in your legs and feet.  Your legs or feet are numb.  Your feet always feel cold. SEEK IMMEDIATE MEDICAL CARE IF:   There is increasing redness,  swelling, or pain in or around a wound.  There is a red line that goes up your leg.  Pus is coming from a wound.  You develop a fever or as directed by your health care provider.  You notice a bad smell coming from an ulcer or wound. Document Released: 10/22/2000 Document Revised: 06/27/2013 Document Reviewed: 04/03/2013 [facility] Patient Information 2015 [place], [place]. This information is not intended to replace advice given to you by your health care provider. Make sure you discuss any questions you have with your health care provider.  Syncope Syncope is a medical term for fainting or passing out. This means you lose consciousness and drop to the ground. People are generally unconscious for less than 5 minutes. You may have some muscle twitches for up to 15 seconds before waking up and returning to normal. Syncope occurs more often in older adults, but it can happen to anyone. While most causes of syncope are not dangerous, syncope can be a sign of a serious medical problem. It is important to seek medical care.  CAUSES  Syncope is caused by a sudden drop in blood flow to the brain. The specific cause is often not determined. Factors that can bring on syncope include:  Taking medicines that lower blood pressure.  Sudden changes in posture, such as standing up quickly.  Taking more medicine than prescribed.  Standing in one place for too long.  Seizure disorders.  Dehydration and excessive exposure to heat.  Low blood sugar (hypoglycemia).  Straining to have a bowel movement.  Heart disease, irregular heartbeat, or other circulatory problems.  Fear, emotional distress, seeing blood, or severe pain. SYMPTOMS  Right before fainting, you may:  Feel dizzy or light-headed.  Feel nauseous.  See all white or all black in your field of vision.  Have cold, clammy skin. DIAGNOSIS  Your health care provider will ask about your symptoms, perform a physical exam, and perform an  electrocardiogram (ECG) to  record the electrical activity of your heart. Your health care provider may also perform other heart or blood tests to determine the cause of your syncope which may include:  Transthoracic echocardiogram (TTE). During echocardiography, sound waves are used to evaluate how blood flows through your heart.  Transesophageal echocardiogram (TEE).  Cardiac monitoring. This allows your health care provider to monitor your heart rate and rhythm in real time.  Holter monitor. This is a portable device that records your heartbeat and can help diagnose heart arrhythmias. It allows your health care provider to track your heart activity for several days, if needed.  Stress tests by exercise or by giving medicine that makes the heart beat faster. TREATMENT  In most cases, no treatment is needed. Depending on the cause of your syncope, your health care provider may recommend changing or stopping some of your medicines. HOME CARE INSTRUCTIONS  Have someone stay with you until you feel stable.  Do not drive, use machinery, or play sports until your health care provider says it is okay.  Keep all follow-up appointments as directed by your health care provider.  Lie down right away if you start feeling like you might faint. Breathe deeply and steadily. Wait until all the symptoms have passed.  Drink enough fluids to keep your urine clear or pale yellow.  If you are taking blood pressure or heart medicine, get up slowly and take several minutes to sit and then stand. This can reduce dizziness. SEEK IMMEDIATE MEDICAL CARE IF:   You have a severe headache.  You have unusual pain in the chest, abdomen, or back.  You are bleeding from your mouth or rectum, or you have black or tarry stool.  You have an irregular or very fast heartbeat.  You have pain with breathing.  You have repeated fainting or seizure-like jerking during an episode.  You faint when sitting or lying  down.  You have confusion.  You have trouble walking.  You have severe weakness.  You have vision problems. If you fainted, call your local emergency services (911 in [place] Do not drive yourself to the hospital.  MAKE SURE YOU:  Understand these instructions.  Will watch your condition.  Will get help right away if you are not doing well or get worse. Document Released: 10/25/2005 Document Revised: 10/30/2013 Document Reviewed: 12/24/2011 [facility] Patient Information 2015 [place], [place]. This information is not intended to replace advice given to you by your health care provider. Make sure you discuss any questions you have with your health care provider.  Type 2 Diabetes Mellitus Type 2 diabetes mellitus is a long-term (chronic) disease. In type 2 diabetes:  The pancreas does not make enough of a hormone called insulin.  The cells in the body do not respond as well to the insulin that is made.  Both of the above can happen. Normally, insulin moves sugars from food into tissue cells. This gives you energy. If you have type 2 diabetes, sugars cannot be moved into tissue cells. This causes high blood sugar (hyperglycemia).  HOME CARE  Have your hemoglobin A1c level checked twice a year. The level shows if your diabetes is under control or out of control.  Test your blood sugar level every day as told by your doctor.  Check your ketone levels by testing your pee (urine) when you are sick and as told.  Take your diabetes or insulin medicine as told by your doctor.  Never run out of insulin.  Adjust how much  insulin you give yourself based on how many carbs (carbohydrates) you eat. Carbs are in many foods, such as fruits, vegetables, whole grains, and dairy products.  Have a healthy snack between every healthy meal. Have 3 meals and 3 snacks a day.  Lose weight if you are overweight.  Carry a medical alert card or wear your medical alert jewelry.  Carry a 15-gram carb  snack with you at all times. Examples include:  Glucose pills, 3 or 4.  Glucose gel, 15-gram tube.  Raisins, 2 tablespoons (24 grams).  Jelly beans, 6.  Animal crackers, 8.  Regular (not diet) pop, 4 ounces (120 milliliters).  Gummy treats, 9.  Notice low blood sugar (hypoglycemia) symptoms, such as:  Shaking (tremors).  Trouble thinking clearly.  Sweating.  Faster heart rate.  Headache.  Dry mouth.  Hunger.  Crabbiness (irritability).  Being worried or tense (anxious).  Restless sleep.  A change in speech or coordination.  Confusion.  Treat low blood sugar right away. If you are alert and can swallow, follow the 15:15 rule:  Take 15-20 grams of a rapid-acting glucose or carb. This includes glucose gel, glucose pills, or 4 ounces (120 milliliters) of fruit juice, regular pop, or low-fat milk.  Check your blood sugar level 15 minutes after taking the glucose.  Take 15-20 grams more of glucose if the repeat blood sugar level is still 70 mg/dL (milligrams/deciliter) or below.  Eat a meal or snack within 1 hour of the blood sugar levels going back to normal.  Notice early symptoms of high blood sugar, such as:  Being really thirsty or drinking a lot (polydipsia).  Peeing a lot (polyuria).  Do at least 150 minutes of physical activity a week or as told.  Split the 150 minutes of activity up during the week. Do not do 150 minutes of activity in one day.  Perform exercises, such as weight lifting, at least 2 times a week or as told.  Spend no more than 90 minutes at one time inactive.  Adjust your insulin or food intake as needed if you start a new exercise or sport.  Follow your sick-day plan when you are not able to eat or drink as usual.  Do not smoke, chew tobacco, or use electronic cigarettes.  Women who are not pregnant should drink no more than 1 drink a day. Men should drink no more than 2 drinks a day.  Only drink alcohol with food.  Ask  your doctor if alcohol is safe for you.  Tell your doctor if you drink alcohol several times during the week.  See your doctor regularly.  Schedule an eye exam soon after you are told you have diabetes. Schedule exams once every year.  Check your skin and feet every day. Check for cuts, bruises, redness, nail problems, bleeding, blisters, or sores. A doctor should do a foot exam once a year.  Brush your teeth and gums twice a day. Floss once a day. Visit your dentist regularly.  Share your diabetes plan with your workplace or school.  Stay up-to-date with shots that fight against diseases (immunizations).  Learn how to deal with stress.  Get diabetes education and support as needed.  Ask your doctor for special help if:  You need help to maintain or improve how you do things on your own.  You need help to maintain or improve the quality of your life.  You have foot or hand problems.  You have trouble cleaning yourself, dressing, eating,  or doing physical activity. GET HELP IF:  You are unable to eat or drink for more than 6 hours.  You feel sick to your stomach (nauseous) or throw up (vomit) for more than 6 hours.  Your blood sugar level is over 240 mg/dL.  There is a change in mental status.  You get another serious illness.  You have watery poop (diarrhea) for more than 6 hours.  You have been sick or have had a fever for 2 or more days and are not getting better.  You have pain when you are active. GET HELP RIGHT AWAY IF:  You have trouble breathing.  Your ketone levels are higher than your doctor says they should be. MAKE SURE YOU:  Understand these instructions.  Will watch your condition.  Will get help right away if you are not doing well or get worse. Document Released: 08/03/2008 Document Revised: 03/11/2014 Document Reviewed: 05/26/2012 [facility] Patient Information 2015 [place], [place]. This information is not intended to replace advice given to  you by your health care provider. Make sure you discuss any questions you have with your health care provider. Cardiomyopathy Cardiomyopathy means a disease of the heart muscle. The heart muscle becomes enlarged or stiff. The heart is not able to pump enough blood or deliver enough oxygen to the body. This leads to heart failure and is the number one reason for heart transplants.  TYPES OF CARDIOMYOPATHY INCLUDE: DILATED  The most common type. The heart muscle is stretched out and weak so there is less blood pumped out.   Some causes:  Disease of the arteries of the heart (ischemia).  Heart attack with muscle scar.  Leaky or damaged valves.  After a viral illness.  Smoking.  High cholesterol.  Diabetes or overactive thyroid.  Alcohol or drug abuse.  High blood pressure.  May be reversible. HYPERTROPHIC The heart muscle grows bigger so there is less room for blood in the ventricle, and not enough blood is pumped out.   Causes include:  Mitral valve leaks.  Inherited tendency (from your family).  No explanation (idiopathic).  May be a cause of sudden death in young athletes with no symptoms. RESTRICTIVE The heart muscle becomes stiff, but not always larger. The heart has to work harder and will get weaker. Abnormal heart beats or rhythm (arrhythmia) are common.  Some causes:  Diseases in other parts of the body which may produce abnormal deposits in the heart muscle.  Probably not inherited.  A result of radiation treatment for cancer. SYMPTOMS OF ALL TYPES:  Less able to exercise or tolerate physical activity.  Palpitations.  Irregular heart beat, heart arrhythmias.  Shortness of breath, even at rest.  Chest pain.  Lightheadedness or fainting. TREATMENT  Life-style changes including reducing salt, lowering cholesterol, stop smoking.  Manage contributing causes with medications.  Medicines to help reduce the fluids in the body.  An implanted  cardioverter defibrillator (ICD) to improve heart function and correct arrhythmias.  Medications to relax the blood vessels and make it easier for the heart to pump.  Drugs that help regulate heart beat and improve heart relaxation, reducing the work of the heart.  Myomectomy for patients with hypertrophic cardiomyopathy and severe problems. This is a surgical procedure that removes a portion of the thickened muscle wall in order to improve heart output and provide symptom relief.  A heart transplant is an option in carefully applied circumstances. SEEK IMMEDIATE MEDICAL CARE IF:   You have severe chest pain, especially  if the pain is crushing or pressure-like and spreads to the arms, back, neck, or jaw, or if you have sweating, feeling sick to your stomach (nausea), or shortness of breath. THIS IS AN EMERGENCY. Do not wait to see if the pain will go away. Get medical help at once. Call your local emergency services (911 in [place] DO NOT drive yourself to the hospital.  You develop severe shortness of breath.  You begin to cough up bloody sputum.  You are unable to sleep because you cannot breathe.  You gain weight due to fluid retention.  You develop painful swelling in your calf or leg.  You feel your heart racing and it does not go away or happens when you are resting. Document Released: 01/07/2005 Document Revised: 01/17/2012 Document Reviewed: 06/12/2008 [facility] Patient Information 2015 [place], [place]. This information is not intended to replace advice given to you by your health care provider. Make sure you discuss any questions you have with your health care provider. Daily Diabetes Record Check your blood glucose (BG) as directed by your health care provider. Use this form to record your results as well as any diabetes medicines you take, including insulin. Checking your BG, recording it, and bringing your records to your health care provider is very helpful in managing your  diabetes. These numbers help your health care provider know if any changes are needed to your diabetes plan.  Week of _____________________________ Date: _________  Seward Carol, BG/Medicines: ________________ / __________________________________________________________  LUNCH, BG/Medicines: ____________________ / __________________________________________________________  Laural Golden, BG/Medicines: ___________________ / __________________________________________________________  BEDTIME, BG/Medicines: __________________ / __________________________________________________________ Date: _________  Seward Carol, BG/Medicines: ________________ / __________________________________________________________  LUNCH, BG/Medicines: ____________________ / __________________________________________________________  Laural Golden, BG/Medicines: ___________________ / __________________________________________________________  BEDTIME, BG/Medicines: __________________ / __________________________________________________________ Date: _________  Seward Carol, BG/Medicines: ________________ / __________________________________________________________  LUNCH, BG/Medicines: ____________________ / __________________________________________________________  Laural Golden, BG/Medicines: ___________________ / __________________________________________________________  BEDTIME, BG/Medicines: __________________ / __________________________________________________________ Date: _________  Seward Carol, BG/Medicines: ________________ / __________________________________________________________  LUNCH, BG/Medicines: ____________________ / __________________________________________________________  Laural Golden, BG/Medicines: ___________________ / __________________________________________________________  BEDTIME, BG/Medicines: __________________ / __________________________________________________________ Date: _________  Seward Carol,  BG/Medicines: ________________ / __________________________________________________________  LUNCH, BG/Medicines: ____________________ / __________________________________________________________  Laural Golden, BG/Medicines: ___________________ / __________________________________________________________  BEDTIME, BG/Medicines: __________________ / __________________________________________________________ Date: _________  Seward Carol, BG/Medicines: ________________ / __________________________________________________________  LUNCH, BG/Medicines: ____________________ / __________________________________________________________  Laural Golden, BG/Medicines: ___________________ / __________________________________________________________  BEDTIME, BG/Medicines: __________________ / __________________________________________________________ Date: _________  Seward Carol, BG/Medicines: ________________ / __________________________________________________________  LUNCH, BG/Medicines: ____________________ / __________________________________________________________  Laural Golden, BG/Medicines: ___________________ / __________________________________________________________  BEDTIME, BG/Medicines: __________________ / __________________________________________________________ Notes: __________________________________________________________________________________________________ Document Released: 09/28/2004 Document Revised: 03/11/2014 Document Reviewed: 12/19/2013 [employer] Patient Information 2015 [employer]. This information is not intended to replace advice given to you by your health care provider. Make sure you discuss any questions you have with your health care provider. Linagliptin oral tablets What is this medicine? Linagliptin (lin a GLIP tin) helps to treat type 2 diabetes. It helps to control blood sugar. Treatment is combined with diet and exercise. This medicine may be used for other purposes;  ask your health care provider or pharmacist if you have questions. COMMON BRAND NAME(S): Tradjenta What should I tell my health care provider before I take this medicine? They need to know if you have any of these conditions: -diabetic ketoacidosis -type 1 diabetes -an unusual or allergic reaction to linagliptin, other medicines, foods, dyes, or preservatives -pregnant or trying to get pregnant -breast-feeding How should I use this medicine? Take this medicine by mouth with a glass of water. Follow the directions on the  prescription label. You can take it with or without food. Take your dose at the same time each day. Do not take more often than directed. Do not stop taking except on your doctor's advice. A special MedGuide will be given to you by the pharmacist with each prescription and refill. Be sure to read this information carefully each time. Talk to your pediatrician regarding the use of this medicine in children. Special care may be needed. Overdosage: If you think you've taken too much of this medicine contact a poison control center or emergency room at once. Overdosage: If you think you have taken too much of this medicine contact a poison control center or emergency room at once. NOTE: This medicine is only for you. Do not share this medicine with others. What if I miss a dose? If you miss a dose, take it as soon as you can. If it is almost time for your next dose, take only that dose. Do not take double or extra doses. What may interact with this medicine? Do not take this medicine with any of the following medications: -gatifloxacin This medicine may also interact with the following medications: -alcohol -bosentan -certain medicines for seizures like carbamazepine, phenobarbital, phenytoin -rifabutin -rifampin -St. Johns Wort -sulfonylureas like glimepiride, glipizide, glyburide This list may not describe all possible interactions. Give your health care provider a list of  all the medicines, herbs, non-prescription drugs, or dietary supplements you use. Also tell them if you smoke, drink alcohol, or use illegal drugs. Some items may interact with your medicine. What should I watch for while using this medicine? Visit your doctor or health care professional for regular checks on your progress. A test called the HbA1C (A1C) will be monitored. This is a simple blood test. It measures your blood sugar control over the last 2 to 3 months. You will receive this test every 3 to 6 months. Learn how to check your blood sugar. Learn the symptoms of low and high blood sugar and how to manage them. Always carry a quick-source of sugar with you in case you have symptoms of low blood sugar. Examples include hard sugar candy or glucose tablets. Make sure others know that you can choke if you eat or drink when you develop serious symptoms of low blood sugar, such as seizures or unconsciousness. They must get medical help at once. Tell your doctor or health care professional if you have high blood sugar. You might need to change the dose of your medicine. If you are sick or exercising more than usual, you might need to change the dose of your medicine. Do not skip meals. Ask your doctor or health care professional if you should avoid alcohol. Many nonprescription cough and cold products contain sugar or alcohol. These can affect blood sugar. Wear a medical ID bracelet or chain, and carry a card that describes your disease and details of your medicine and dosage times. What side effects may I notice from receiving this medicine? Side effects that you should report to your doctor or health care professional as soon as possible: -allergic reactions like skin rash, itching or hives, swelling of the face, lips, or tongue -breathing problems -fever, chills -nausea, vomiting -signs and symptoms of low blood sugar such as feeling anxious, confusion, dizziness, increased hunger, unusually weak  or tired, sweating, shakiness, cold, irritable, headache, blurred vision, fast heartbeat, loss of consciousness -unusual stomach pain or discomfort -vomiting Side effects that usually do not require medical attention (Report these to  your doctor or health care professional if they continue or are bothersome.): -headache -sore throat -stuffy or runny nose This list may not describe all possible side effects. Call your doctor for medical advice about side effects. You may report side effects to FDA at 1-800-FDA-1088. Where should I keep my medicine? Keep out of the reach of children. Store at room temperature between 15 and 30 degrees C (59 and 86 degrees F). Throw away any unused medicine after the expiration date. NOTE: This sheet is a summary. It may not cover all possible information. If you have questions about this medicine, talk to your doctor, pharmacist, or health care provider.  2015, [employer]. (2013-12-20 11:16:32)

## 2014-07-26 NOTE — Progress Notes (Signed)
Pt discharge instructions and prescriptions given along with 3 in 1 BSC.  Pt left floor via wheelchair accompanied by staff and family.

## 2014-07-26 NOTE — Progress Notes (Signed)
CARE MANAGEMENT NOTE 07/26/2014  Patient:  KYEIR, FRIEBEL   Account Number:  [id number]  Date Initiated:  07/25/2014  Documentation initiated by:  MAYO,HENRIETTA  Subjective/Objective Assessment:   dx orthostatic hypotension; lives with spouse     Action/Plan:   Anticipated DC Date:  07/27/2014   Anticipated DC Plan:  HOME/SELF CARE      DC Planning Services  CM consult  PCP issues      Choice offered to / List presented to:     DME arranged  3-N-1      DME agency  [facility]        Status of service:  Completed, signed off Medicare Important Message given?  YES (If response is "NO", the following Medicare IM given date fields will be blank) Date Medicare IM given:  07/26/2014 Medicare IM given by:  [facility] Date Additional Medicare IM given:   Additional Medicare IM given by:    Discharge Disposition:  HOME/SELF CARE  Per UR Regulation:  Reviewed for med. necessity/level of care/duration of stay  If discussed at Long Length of Stay Meetings, dates discussed:    Comments:  07/25/2014 1500 NCM spoke to [facility] and they request pt call back on Monday to arrange appt for next week. NCM gave information to pt. Pt will call Monday for appt.  Pt requesting 3n1 for home. Notified [facility] for 3n1 for home. Isidoro Donning RN CCM Case Mgmt phone [phone]  07/25/14 1410 Verdis Prime RN MSN BSN CCM Pt has no PCP and requests assistance with an appt @ [facility] and [facility].  TC to Center, voice mail message left requesting return call and providing pt's MRN.

## 2014-07-29 LAB — CULTURE, BLOOD (ROUTINE X 2)
CULTURE: NO GROWTH
Culture: NO GROWTH

## 2014-08-08 ENCOUNTER — Ambulatory Visit: Payer: Medicare Other | Attending: Family Medicine | Admitting: Family Medicine

## 2014-08-08 ENCOUNTER — Encounter: Payer: Self-pay | Admitting: Family Medicine

## 2014-08-08 VITALS — BP 99/65 | HR 97 | Temp 97.8°F | Resp 20 | Ht 74.0 in | Wt 292.0 lb

## 2014-08-08 DIAGNOSIS — Z23 Encounter for immunization: Secondary | ICD-10-CM

## 2014-08-08 DIAGNOSIS — Z992 Dependence on renal dialysis: Secondary | ICD-10-CM | POA: Diagnosis not present

## 2014-08-08 DIAGNOSIS — E1122 Type 2 diabetes mellitus with diabetic chronic kidney disease: Secondary | ICD-10-CM | POA: Diagnosis not present

## 2014-08-08 DIAGNOSIS — R739 Hyperglycemia, unspecified: Secondary | ICD-10-CM

## 2014-08-08 DIAGNOSIS — Z0000 Encounter for general adult medical examination without abnormal findings: Secondary | ICD-10-CM

## 2014-08-08 DIAGNOSIS — I120 Hypertensive chronic kidney disease with stage 5 chronic kidney disease or end stage renal disease: Secondary | ICD-10-CM | POA: Diagnosis not present

## 2014-08-08 DIAGNOSIS — N189 Chronic kidney disease, unspecified: Secondary | ICD-10-CM

## 2014-08-08 DIAGNOSIS — I951 Orthostatic hypotension: Secondary | ICD-10-CM | POA: Diagnosis not present

## 2014-08-08 DIAGNOSIS — N186 End stage renal disease: Secondary | ICD-10-CM

## 2014-08-08 LAB — GLUCOSE, POCT (MANUAL RESULT ENTRY): POC GLUCOSE: 124 mg/dL — AB (ref 70–99)

## 2014-08-08 MED ORDER — ZOSTER VACCINE LIVE 19400 UNT/0.65ML ~~LOC~~ SOLR: 0.6500 mL | Freq: Once | SUBCUTANEOUS | Status: DC

## 2014-08-08 MED ORDER — LINAGLIPTIN 5 MG PO TABS: 5.0000 mg | ORAL_TABLET | Freq: Every day | ORAL | Status: AC

## 2014-08-08 NOTE — Assessment & Plan Note (Signed)
Tdap done today. Rx for zostavax provided.

## 2014-08-08 NOTE — Assessment & Plan Note (Signed)
A: CBG at goal on tradjenta. Med: complaint P: continue tradjenta

## 2014-08-08 NOTE — Progress Notes (Signed)
Establish Care HFU Anemia Pt do not remember medication he is taking

## 2014-08-08 NOTE — Progress Notes (Signed)
   Subjective:    Patient ID: Nicolas Mccann, male    DOB: January 27, 1946, 68 y.o.   MRN: [id number] CC: establish care, DM2 with ESRD-HD MWF   HPI 68 year old male presents to establish care discussed the following:  #1 type 2 diabetes mellitus: With worsening renal disease patient has been discontinued from glipizide. Patient's compliant with retention. Patient requires glucometer teaching. He is not checking his blood sugars.  #2 end-stage renal disease HD dependent Monday Wednesday Friday: Patient sees urinalysis a [facility] on [address]. He is compliant to schedule. He went HD yesterday.  #3 history of hypertension now hypotension and syncopal episode: Patient was hospitalized for syncopal episode and hypotension. Patient is discontinued from all antihypertensives. Patient has not had recurrent syncopal episodes.  Soc Hx: non smoker  Review of Systems As per HPI     Objective:   Physical Exam BP 99/65  Pulse 97  Temp(Src) 97.8 F (36.6 C) (Oral)  Resp 20  Ht 6\' 2"  (1.88 m)  Wt 292 lb (132.45 kg)  BMI 37.47 kg/m2  SpO2 94% BP Readings from Last 3 Encounters:  08/08/14 99/65  07/26/14 102/61  06/13/14 109/75  General appearance: alert, cooperative and no distress Lungs: clear to auscultation bilaterally Heart: regular rate and rhythm, S1, S2 normal, no murmur, click, rub or gallop Extremities: edema 1 + b/l. LUE AV fistula with palpable thrill and audible bruit  Lab Results  Component Value Date   HGBA1C 7.1* 07/24/2014        Assessment & Plan:

## 2014-08-08 NOTE — Assessment & Plan Note (Signed)
A: previously with hypertension, recently developed orthostatic hypotension with syncope. Off all antihypertensives P: Continue to be off all antihypertensive

## 2014-08-08 NOTE — Patient Instructions (Signed)
Nicolas Mccann,  Thank you for coming in today. It was a pleasure meeting you. I look forward to being your primary doctor.   1. For diabetes: continue tradjenta. Return 1-2 weeks for nurse visit for glucometer teaching, bring your medications.  After you learn how to use your meter, check 1-2 times daily and write down the time and your blood sugar.   2. For previous high blood pressure. You now have low/normal blood pressure. Do not take any of your old blood pressure medicine.  F/u with me in 6 weeks for diabetes f/u.   Dr. Armen Pickup

## 2014-08-21 ENCOUNTER — Emergency Department (HOSPITAL_COMMUNITY): Payer: Medicare Other

## 2014-08-21 ENCOUNTER — Encounter (HOSPITAL_COMMUNITY): Payer: Self-pay | Admitting: Emergency Medicine

## 2014-08-21 ENCOUNTER — Inpatient Hospital Stay (HOSPITAL_COMMUNITY): Payer: Medicare Other

## 2014-08-21 ENCOUNTER — Inpatient Hospital Stay (HOSPITAL_COMMUNITY)
Admission: EM | Admit: 2014-08-21 | Discharge: 2014-08-24 | DRG: 393 | Disposition: A | Discharge status: Home / Self Care | Payer: Medicare Other | Attending: Internal Medicine | Admitting: Internal Medicine

## 2014-08-21 DIAGNOSIS — E1129 Type 2 diabetes mellitus with other diabetic kidney complication: Secondary | ICD-10-CM

## 2014-08-21 DIAGNOSIS — Z87891 Personal history of nicotine dependence: Secondary | ICD-10-CM

## 2014-08-21 DIAGNOSIS — K649 Unspecified hemorrhoids: Secondary | ICD-10-CM | POA: Diagnosis present

## 2014-08-21 DIAGNOSIS — I951 Orthostatic hypotension: Secondary | ICD-10-CM

## 2014-08-21 DIAGNOSIS — I509 Heart failure, unspecified: Secondary | ICD-10-CM | POA: Diagnosis present

## 2014-08-21 DIAGNOSIS — E1122 Type 2 diabetes mellitus with diabetic chronic kidney disease: Secondary | ICD-10-CM | POA: Diagnosis present

## 2014-08-21 DIAGNOSIS — I420 Dilated cardiomyopathy: Secondary | ICD-10-CM

## 2014-08-21 DIAGNOSIS — Z6837 Body mass index (BMI) 37.0-37.9, adult: Secondary | ICD-10-CM

## 2014-08-21 DIAGNOSIS — N186 End stage renal disease: Secondary | ICD-10-CM

## 2014-08-21 DIAGNOSIS — K625 Hemorrhage of anus and rectum: Secondary | ICD-10-CM | POA: Diagnosis present

## 2014-08-21 DIAGNOSIS — N2581 Secondary hyperparathyroidism of renal origin: Secondary | ICD-10-CM | POA: Diagnosis present

## 2014-08-21 DIAGNOSIS — M1990 Unspecified osteoarthritis, unspecified site: Secondary | ICD-10-CM | POA: Diagnosis present

## 2014-08-21 DIAGNOSIS — R911 Solitary pulmonary nodule: Secondary | ICD-10-CM | POA: Diagnosis present

## 2014-08-21 DIAGNOSIS — I120 Hypertensive chronic kidney disease with stage 5 chronic kidney disease or end stage renal disease: Secondary | ICD-10-CM | POA: Diagnosis not present

## 2014-08-21 DIAGNOSIS — R197 Diarrhea, unspecified: Secondary | ICD-10-CM

## 2014-08-21 DIAGNOSIS — N189 Chronic kidney disease, unspecified: Secondary | ICD-10-CM

## 2014-08-21 DIAGNOSIS — M109 Gout, unspecified: Secondary | ICD-10-CM | POA: Diagnosis present

## 2014-08-21 DIAGNOSIS — I4891 Unspecified atrial fibrillation: Secondary | ICD-10-CM | POA: Diagnosis present

## 2014-08-21 DIAGNOSIS — K559 Vascular disorder of intestine, unspecified: Secondary | ICD-10-CM | POA: Diagnosis not present

## 2014-08-21 DIAGNOSIS — Z0000 Encounter for general adult medical examination without abnormal findings: Secondary | ICD-10-CM

## 2014-08-21 DIAGNOSIS — D631 Anemia in chronic kidney disease: Secondary | ICD-10-CM

## 2014-08-21 DIAGNOSIS — Z992 Dependence on renal dialysis: Secondary | ICD-10-CM

## 2014-08-21 DIAGNOSIS — R531 Weakness: Secondary | ICD-10-CM

## 2014-08-21 DIAGNOSIS — R918 Other nonspecific abnormal finding of lung field: Secondary | ICD-10-CM

## 2014-08-21 LAB — CBC
HEMATOCRIT: 33.4 % — AB (ref 39.0–52.0)
Hemoglobin: 11 g/dL — ABNORMAL LOW (ref 13.0–17.0)
MCH: 34.9 pg — ABNORMAL HIGH (ref 26.0–34.0)
MCHC: 32.9 g/dL (ref 30.0–36.0)
MCV: 106 fL — ABNORMAL HIGH (ref 78.0–100.0)
Platelets: 239 10*3/uL (ref 150–400)
RBC: 3.15 MIL/uL — ABNORMAL LOW (ref 4.22–5.81)
RDW: 15.2 % (ref 11.5–15.5)
WBC: 8 10*3/uL (ref 4.0–10.5)

## 2014-08-21 LAB — COMPREHENSIVE METABOLIC PANEL
ALK PHOS: 155 U/L — AB (ref 39–117)
ALT: 16 U/L (ref 0–53)
ANION GAP: 17 — AB (ref 5–15)
AST: 22 U/L (ref 0–37)
Albumin: 3.3 g/dL — ABNORMAL LOW (ref 3.5–5.2)
BILIRUBIN TOTAL: 0.8 mg/dL (ref 0.3–1.2)
BUN: 16 mg/dL (ref 6–23)
CHLORIDE: 100 meq/L (ref 96–112)
CO2: 27 mEq/L (ref 19–32)
Calcium: 9.3 mg/dL (ref 8.4–10.5)
Creatinine, Ser: 3.76 mg/dL — ABNORMAL HIGH (ref 0.50–1.35)
GFR calc non Af Amer: 15 mL/min — ABNORMAL LOW (ref >90)
GFR, EST AFRICAN AMERICAN: 18 mL/min — AB (ref >90)
GLUCOSE: 124 mg/dL — AB (ref 70–99)
POTASSIUM: 3.3 meq/L — AB (ref 3.7–5.3)
Sodium: 144 mEq/L (ref 137–147)
Total Protein: 8.3 g/dL (ref 6.0–8.3)

## 2014-08-21 LAB — TYPE AND SCREEN
ABO/RH(D): A POS
Antibody Screen: NEGATIVE

## 2014-08-21 LAB — MRSA PCR SCREENING: MRSA by PCR: NEGATIVE

## 2014-08-21 LAB — POC OCCULT BLOOD, ED: Fecal Occult Bld: POSITIVE — AB

## 2014-08-21 LAB — PROTIME-INR
INR: 1.41 (ref 0.00–1.49)
PROTHROMBIN TIME: 17.4 s — AB (ref 11.6–15.2)

## 2014-08-21 LAB — PRO B NATRIURETIC PEPTIDE: Pro B Natriuretic peptide (BNP): 13770 pg/mL — ABNORMAL HIGH (ref 0–125)

## 2014-08-21 LAB — GLUCOSE, CAPILLARY
GLUCOSE-CAPILLARY: 155 mg/dL — AB (ref 70–99)
Glucose-Capillary: 98 mg/dL (ref 70–99)
Glucose-Capillary: 116 mg/dL — ABNORMAL HIGH (ref 70–99)

## 2014-08-21 LAB — APTT: APTT: 36 s (ref 24–37)

## 2014-08-21 MED ORDER — SODIUM CHLORIDE 0.9 % IJ SOLN
3.0000 mL | Freq: Two times a day (BID) | INTRAMUSCULAR | Status: DC
  Administered 2014-08-21 – 2014-08-22 (×2): 3 mL via INTRAVENOUS

## 2014-08-21 MED ORDER — AMITRIPTYLINE HCL 25 MG PO TABS
25.0000 mg | ORAL_TABLET | Freq: Every evening | ORAL | Status: DC | PRN
  Filled 2014-08-21: qty 1

## 2014-08-21 MED ORDER — PIPERACILLIN-TAZOBACTAM IN DEX 2-0.25 GM/50ML IV SOLN
2.2500 g | Freq: Three times a day (TID) | INTRAVENOUS | Status: DC
  Administered 2014-08-21 – 2014-08-23 (×6): 2.25 g via INTRAVENOUS
  Filled 2014-08-21 (×8): qty 50

## 2014-08-21 MED ORDER — LUBIPROSTONE 24 MCG PO CAPS
24.0000 ug | ORAL_CAPSULE | Freq: Two times a day (BID) | ORAL | Status: DC
  Administered 2014-08-21 – 2014-08-23 (×4): 24 ug via ORAL
  Filled 2014-08-21 (×6): qty 1

## 2014-08-21 MED ORDER — GUAIFENESIN ER 600 MG PO TB12
600.0000 mg | ORAL_TABLET | Freq: Two times a day (BID) | ORAL | Status: DC
  Administered 2014-08-21 – 2014-08-24 (×6): 600 mg via ORAL
  Filled 2014-08-21 (×7): qty 1

## 2014-08-21 MED ORDER — ONDANSETRON HCL 4 MG/2ML IJ SOLN: 4.0000 mg | Freq: Four times a day (QID) | INTRAMUSCULAR | Status: DC | PRN

## 2014-08-21 MED ORDER — RENA-VITE PO TABS
1.0000 | ORAL_TABLET | Freq: Every day | ORAL | Status: DC
  Administered 2014-08-21 – 2014-08-23 (×3): 1 via ORAL
  Filled 2014-08-21 (×4): qty 1

## 2014-08-21 MED ORDER — SODIUM CHLORIDE 0.9 % IJ SOLN: 3.0000 mL | INTRAMUSCULAR | Status: DC | PRN

## 2014-08-21 MED ORDER — SODIUM CHLORIDE 0.9 % IV SOLN: 250.0000 mL | INTRAVENOUS | Status: DC | PRN

## 2014-08-21 MED ORDER — VANCOMYCIN HCL IN DEXTROSE 1-5 GM/200ML-% IV SOLN
1000.0000 mg | INTRAVENOUS | Status: DC
  Administered 2014-08-23: 1000 mg via INTRAVENOUS
  Filled 2014-08-21: qty 200

## 2014-08-21 MED ORDER — INSULIN ASPART 100 UNIT/ML ~~LOC~~ SOLN
0.0000 [IU] | Freq: Three times a day (TID) | SUBCUTANEOUS | Status: DC
  Administered 2014-08-22: 1 [IU] via SUBCUTANEOUS
  Administered 2014-08-22: 2 [IU] via SUBCUTANEOUS
  Administered 2014-08-23 (×2): 1 [IU] via SUBCUTANEOUS

## 2014-08-21 MED ORDER — ACETAMINOPHEN 325 MG PO TABS: 650.0000 mg | ORAL_TABLET | Freq: Four times a day (QID) | ORAL | Status: DC | PRN

## 2014-08-21 MED ORDER — LEVOFLOXACIN IN D5W 750 MG/150ML IV SOLN
750.0000 mg | Freq: Once | INTRAVENOUS | Status: AC
  Administered 2014-08-21: 750 mg via INTRAVENOUS
  Filled 2014-08-21: qty 150

## 2014-08-21 MED ORDER — ATORVASTATIN CALCIUM 40 MG PO TABS
40.0000 mg | ORAL_TABLET | Freq: Every day | ORAL | Status: DC
  Administered 2014-08-21 – 2014-08-23 (×3): 40 mg via ORAL
  Filled 2014-08-21 (×4): qty 1

## 2014-08-21 MED ORDER — SODIUM CHLORIDE 0.9 % IJ SOLN
3.0000 mL | Freq: Two times a day (BID) | INTRAMUSCULAR | Status: DC
  Administered 2014-08-21: 3 mL via INTRAVENOUS

## 2014-08-21 MED ORDER — LEVALBUTEROL HCL 0.63 MG/3ML IN NEBU: 0.6300 mg | INHALATION_SOLUTION | Freq: Four times a day (QID) | RESPIRATORY_TRACT | Status: DC | PRN

## 2014-08-21 MED ORDER — ONDANSETRON HCL 4 MG PO TABS: 4.0000 mg | ORAL_TABLET | Freq: Four times a day (QID) | ORAL | Status: DC | PRN

## 2014-08-21 MED ORDER — IOHEXOL 300 MG/ML  SOLN: 50.0000 mL | Freq: Once | INTRAMUSCULAR | Status: AC | PRN

## 2014-08-21 MED ORDER — ACETAMINOPHEN 650 MG RE SUPP: 650.0000 mg | Freq: Four times a day (QID) | RECTAL | Status: DC | PRN

## 2014-08-21 MED ORDER — VANCOMYCIN HCL 10 G IV SOLR
2500.0000 mg | Freq: Once | INTRAVENOUS | Status: AC
  Administered 2014-08-21: 2500 mg via INTRAVENOUS
  Filled 2014-08-21: qty 2500

## 2014-08-21 NOTE — ED Notes (Signed)
Admitting at bedside 

## 2014-08-21 NOTE — Consult Note (Signed)
Reason for Consult:I was asked to see this patient because of rectal bleeding and a CT scan demonstrating pneumatosis of the right colon, some paracolic bubbles of extraluminal air, and concern for ischemic colitis. Referring Physician: [name] is an 68 y.o. male.  HPI: The patient states that he's had rectal bleeding on and off for the past 2 days, but worse starting with dialysis this morning. Prior to today he did notice some blood on his toilet paper after bowel movements but noted blood in the toilet today.  He typically has had problems with his blood pressure during dialysis leading to the removal of several of his antihypertensive medications. He says that it is not uncommon for him to have to stay after completion of dialysis to recover because of low blood pressure.  He came into the emergency room primarily because of the blood in the stools. He had no syncopal episodes. He complained of no abdominal pain. His past surgical history is significant only for left knee surgery. He is a diabetic, and has hypertension. He has been on dialysis for the past 2 years.  Past Medical History  Diagnosis Date  . Obesity   . Gout   . Chest pain 03/05/2014  . Shortness of breath   . Arthritis     ALL OVER  . Diabetes mellitus without complication dx 0488  . Hypertension dx 1965  . ESRD on hemodialysis Dx 2005    [place], [name], ESRD due to DM/HTN, started dialysis in Sep 12, 2012  . Renal failure     Past Surgical History  Procedure Laterality Date  . Insertion of dialysis catheter  09/15/2012    Procedure: INSERTION OF DIALYSIS CATHETER;  Surgeon: Angelia Mould, MD;  Location: [facility] OR;  Service: Vascular;  Laterality: N/A;  . Menisectomy      L knee  . Av fistula placement Left     Family History  Problem Relation Age of Onset  . Diabetes Mellitus II    . Hypertension    . Hypertension Mother   . Diabetes Mother   . Hypertension Father     Social History:   reports that he has never smoked. He quit smokeless tobacco use about 51 years ago. He reports that he does not drink alcohol or use illicit drugs.  Allergies: No Known Allergies  Medications:  Scheduled: . atorvastatin  40 mg Oral q1800  . guaiFENesin  600 mg Oral BID  . insulin aspart  0-9 Units Subcutaneous TID WC  . lubiprostone  24 mcg Oral BID WC  . multivitamin  1 tablet Oral QHS  . piperacillin-tazobactam (ZOSYN)  IV  2.25 g Intravenous 3 times per day  . sodium chloride  3 mL Intravenous Q12H  . sodium chloride  3 mL Intravenous Q12H  . vancomycin  2,500 mg Intravenous Once   Followed by  . [START ON 08/23/2014] vancomycin  1,000 mg Intravenous Q M,W,F-HD    Results for orders placed during the hospital encounter of 08/21/14 (from the past 48 hour(s))  POC OCCULT BLOOD, ED     Status: Abnormal   Collection Time    08/21/14 12:59 PM      Result Value Ref Range   Fecal Occult Bld POSITIVE (*) NEGATIVE  CBC     Status: Abnormal   Collection Time    08/21/14  1:25 PM      Result Value Ref Range   WBC 8.0  4.0 - 10.5 K/uL  RBC 3.15 (*) 4.22 - 5.81 MIL/uL   Hemoglobin 11.0 (*) 13.0 - 17.0 g/dL   HCT 33.4 (*) 39.0 - 52.0 %   MCV 106.0 (*) 78.0 - 100.0 fL   MCH 34.9 (*) 26.0 - 34.0 pg   MCHC 32.9  30.0 - 36.0 g/dL   RDW 15.2  11.5 - 15.5 %   Platelets 239  150 - 400 K/uL  COMPREHENSIVE METABOLIC PANEL     Status: Abnormal   Collection Time    08/21/14  1:25 PM      Result Value Ref Range   Sodium 144  137 - 147 mEq/L   Potassium 3.3 (*) 3.7 - 5.3 mEq/L   Chloride 100  96 - 112 mEq/L   CO2 27  19 - 32 mEq/L   Glucose, Bld 124 (*) 70 - 99 mg/dL   BUN 16  6 - 23 mg/dL   Creatinine, Ser 3.76 (*) 0.50 - 1.35 mg/dL   Calcium 9.3  8.4 - 10.5 mg/dL   Total Protein 8.3  6.0 - 8.3 g/dL   Albumin 3.3 (*) 3.5 - 5.2 g/dL   AST 22  0 - 37 U/L   ALT 16  0 - 53 U/L   Alkaline Phosphatase 155 (*) 39 - 117 U/L   Total Bilirubin 0.8  0.3 - 1.2 mg/dL   GFR calc non Af Amer 15  (*) >90 mL/min   GFR calc Af Amer 18 (*) >90 mL/min   Comment: (NOTE)     The eGFR has been calculated using the CKD EPI equation.     This calculation has not been validated in all clinical situations.     eGFR's persistently <90 mL/min signify possible Chronic Kidney     Disease.   Anion gap 17 (*) 5 - 15  TYPE AND SCREEN     Status: None   Collection Time    08/21/14  1:25 PM      Result Value Ref Range   ABO/RH(D) A POS     Antibody Screen NEG     Sample Expiration 08/24/2014    PRO B NATRIURETIC PEPTIDE     Status: Abnormal   Collection Time    08/21/14  1:25 PM      Result Value Ref Range   Pro B Natriuretic peptide (BNP) 13770.0 (*) 0 - 125 pg/mL  GLUCOSE, CAPILLARY     Status: None   Collection Time    08/21/14  4:41 PM      Result Value Ref Range   Glucose-Capillary 98  70 - 99 mg/dL    Ct Abdomen Pelvis Wo Contrast  08/21/2014   CLINICAL DATA:  Rectal bleeding, diffuse abdominal pain, weakness  EXAM: CT ABDOMEN AND PELVIS WITHOUT CONTRAST  TECHNIQUE: Multidetector CT imaging of the abdomen and pelvis was performed following the standard protocol without IV contrast.  COMPARISON:  None.  FINDINGS: Lung bases shows 8 mm nodule in right lower lobe posteriorly. Follow-up CT scan in 3-6 months is recommended to assure stability.  The study is limited without IV contrast. There is mild perihepatic ascites. No calcified gallstones are noted within gallbladder.  Unenhanced pancreas, spleen and adrenal glands are unremarkable. Unenhanced kidneys are atrophic smaller in size. A cyst in midpole of the right kidney measures 1.1 cm. There is a cyst in lower pole of the left kidney measures 1.4 cm. Exophytic cyst in lower pole of the left kidney measures 1 cm.  No small bowel obstruction.  There  is a small midline supraumbilical hernia containing omental fat and small amount of fluid without evidence of acute complication measures 2.6 by 2.3 cm.  There is extensive pneumatosis right colonic  wall. Axial image 41 there is a linear air air structure extraluminal probable air within colonic vein. Axial image 37 there is small amount of extraluminal air anterior to the colon concerning for small pneumoperitoneum. Findings are highly concerning for segmental colitis probable ischemic starting from the cecum to the level of hepatic flexure . Clinical correlation is necessary.  Oral contrast material was given to the patient. No small bowel obstruction. The terminal ileum is small caliber. No pericecal inflammation. Normal appendix.  No distal colonic obstruction. The urinary bladder is empty limiting its assessment. Prostate gland and seminal vesicles are unremarkable. Bilateral inguinal lymph nodes are noted. Largest right inguinal lymph node measures 2.2 x 1.5 cm. Largest left inguinal lymph node measures 2 by 1.6 cm. Clinical correlation is necessary to exclude adenopathy.  There is a right diaphragmatic lymph node measures 1.3 cm probable pathologic.  Sagittal images of the spine shows degenerative changes lumbar spine.  No hydronephrosis or hydroureter.  IMPRESSION: 1. There is segmental pneumatosis in right colonic wall. Small amount of extraluminal air anterior to the colon concerning for small pneumoperitoneum. There is linear air medial to the colon probable within colonic vein. Findings are highly suspicious for segmental colitis probable ischemic. Clinical correlation is necessary. 2. No small bowel obstruction. 3. 8 mm nodule in right lower lobe posteriorly. Follow-up CT scan in 3-6 months is recommended to assure stability. There is mild enlarged lymph node in right diaphragm/right cardiophrenic angle. Pathologic adenopathy cannot be excluded. 4. Bilateral atrophic kidneys. No hydronephrosis or hydroureter. Bilateral renal cyst. 5. Bilateral inguinal enlarged lymph nodes. Clinical correlation is necessary to exclude inguinal adenopathy. 6. No distal colonic obstruction. 7. Discussed with Dr.  Maryclare Bean   Electronically Signed   By: Lahoma Crocker M.D.   On: 08/21/2014 18:38   Dg Chest 2 View  08/21/2014   CLINICAL DATA:  Fatigue.  EXAM: CHEST  2 VIEW  COMPARISON:  None.  FINDINGS: Mediastinum and hilar structures are normal. Cardiomegaly. Pulmonary vascularity normal. Mild right basilar atelectasis versus infiltrate. No pleural effusion or pneumothorax. Degenerative changes thoracic spine P  IMPRESSION: 1.  Mild right base subsegmental atelectasis versus infiltrate.  2.  Stable cardiomegaly, no CHF.   Electronically Signed   By: Marcello Moores  Register   On: 08/21/2014 14:51    Review of Systems  Constitutional: Negative for fever and chills.  HENT: Negative.        Lightheadedness after dialysis  Eyes: Negative.   Respiratory: Positive for shortness of breath (with exertions).   Cardiovascular: Positive for palpitations.  Gastrointestinal: Positive for diarrhea and blood in stool. Negative for nausea, vomiting and abdominal pain.  Genitourinary: Negative.   Musculoskeletal: Negative.   Skin: Negative.   Neurological: Positive for dizziness.  Endo/Heme/Allergies: Bruises/bleeds easily.  Psychiatric/Behavioral: Negative.    Blood pressure 98/61, pulse 90, temperature 97.7 F (36.5 C), temperature source Oral, resp. rate 29, height _0  (1.88 m), weight 134 kg (295 lb 6.7 oz), SpO2 95.00%. Physical Exam  Vitals reviewed. Constitutional: He is oriented to person, place, and time.  Obese  HENT:  Head: Normocephalic and atraumatic.  Eyes: Conjunctivae and EOM are normal. Pupils are equal, round, and reactive to light.  Neck: Normal range of motion. Neck supple.  Cardiovascular: Normal heart sounds.  An irregularly irregular rhythm present. Tachycardia  present.   Respiratory: Effort normal and breath sounds normal. He has no wheezes.  GI: Soft. Bowel sounds are normal. He exhibits no mass. There is no tenderness. There is no rebound and no guarding.  Genitourinary: Penis normal.   Musculoskeletal: Normal range of motion.  Neurological: He is alert and oriented to person, place, and time. He has normal reflexes.  Skin: Skin is warm and dry.  Psychiatric: He has a normal mood and affect. His behavior is normal. Judgment and thought content normal.    Assessment/Plan: 68 year old gentleman, morbidly obese, history of hypertension and diabetes. On hemodialysis for the past 2 years with a history of what sounds like postdialysis hypotension. Recent development of bright red blood per abdomen, worse today than ever before. Came to emergency department where a CT scan was done which demonstrated pneumatosis of the right colon with evidence of pericolonic extraluminal air. The patient was admitted to the medical service initially to regular floor but transferred to 2 central step down unit because of the findings on the CT scan. The patient's clinical condition has not changed.  Is remarkable in that the patient has a completely benign abdominal examination. I was able to witness a recent bowel movement which demonstrated what appeared to just be the oral contrast from the CT scan but no bright red blood. He has normal active bowel sounds and recently had wanted to eat. He is completely without evidence of peritonitis.  Although the findings on his CT scan concerning for ischemic colitis of the right colon with possible bowel perforation, his clinical examination does not demonstrate evidence of sepsis or peritonitis. His white blood cell count is normal. His hemoglobin is low at 11.8 but not as significant for a hemodialysis patient. His coagulation studies are pending. His lactic acid level is not elevated at 0.9. Most remarkably he has absolutely no abdominal tenderness.  There is a chance the patient will worsen in the future especially if he continues to have periods of low-flow and hypotension during or after dialysis. Currently he does not require an urgent laparotomy. However,  I will keep him n.p.o. Should his condition worsen throughout the night. He will be signed out to our surgical team. They will decide in the morning as surgery would be necessary. However, based on his current examination I do not think that laparotomy is necessary. If he did have surgery he will likely get a right hemicolectomy along with an ileostomy.  Shirle Provencal, JAY 08/21/2014, 8:35 PM

## 2014-08-21 NOTE — ED Notes (Signed)
Jill NT at bedside applying pressure to L upper arm post HD de access, phlebotomy at bedside

## 2014-08-21 NOTE — Consult Note (Signed)
Referring Provider: M. Elmahi Primary Care Physician:  Lora PaulaFUNCHES, JOSALYN C, MD Primary Gastroenterologist:  None (unassigned)  Reason for Consultation:  Rectal bleeding  HPI: Nicolas Mccann is a 68 y.o. male dialysis patient who, for the past several days, has had bleeding with alteration of bowel habit, without a drop in hemoglobin. He states that he has never had a colonoscopy.  By his report, he usually has 1 bowel movement a day, but in the past several days, several things have changed with respect to his bowel habits: They are more frequent (several times a day), softer or looser in character, associated with some low abdominal cramping, and the presence of blood, initially noted on the toilet paper but also noted in the emergency room, where the ER physician felt that the blood was separate from the stool. He has not had significant perianal pain but apparently was tender to attempted inspection of the perianal area.  Meanwhile, since the time I saw the patient, his abdominal and pelvic CT scan has been performed, showing pneumatosis and possible slight free air in the region of the descending colon, suggesting possible ischemic colitis.   Past Medical History  Diagnosis Date  . Obesity   . Gout   . Chest pain 03/05/2014  . Shortness of breath   . Arthritis     ALL OVER  . Diabetes mellitus without complication dx 1965  . Hypertension dx 1965  . ESRD on hemodialysis Dx 2005    [facility], [name], ESRD due to DM/HTN, started dialysis in Sep 12, 2012  . Renal failure     Past Surgical History  Procedure Laterality Date  . Insertion of dialysis catheter  09/15/2012    Procedure: INSERTION OF DIALYSIS CATHETER;  Surgeon: Chuck Hinthristopher S Dickson, MD;  [facility];  Service: Vascular;  Laterality: N/A;  . Menisectomy      L knee  . Av fistula placement Left     Prior to Admission medications   Medication Sig Start Date End Date Taking? Authorizing Provider  allopurinol (ZYLOPRIM) 100  MG tablet Take 100 mg by mouth daily. 06/25/14  Yes Historical Provider, MD  amitriptyline (ELAVIL) 25 MG tablet Take 25 mg by mouth at bedtime as needed for sleep.    Yes Historical Provider, MD  atorvastatin (LIPITOR) 40 MG tablet Take 1 tablet (40 mg total) by mouth daily at 6 PM. 03/07/14  Yes Ricki RodriguezAjay S Kadakia, MD  b complex-vitamin c-folic acid (NEPHRO-VITE) 0.8 MG TABS tablet Take 0.8 mg by mouth daily.    Yes Historical Provider, MD  lanthanum (FOSRENOL) 1000 MG chewable tablet Chew 1,000 mg by mouth 2 (two) times daily with a meal.   Yes Historical Provider, MD  linagliptin (TRADJENTA) 5 MG TABS tablet Take 1 tablet (5 mg total) by mouth daily. 08/08/14  Yes Josalyn C Funches, MD  metoprolol tartrate (LOPRESSOR) 25 MG tablet Take 25 mg by mouth daily.  08/11/14  Yes Historical Provider, MD  Nutritional Supplements (FEEDING SUPPLEMENT, NEPRO CARB STEADY,) LIQD Take 237 mLs by mouth as needed (missed meal during dialysis.). 07/26/14  Yes Russella DarAllison L Ellis, NP    Current Facility-Administered Medications  Medication Dose Route Frequency Provider Last Rate Last Dose  . 0.9 %  sodium chloride infusion  250 mL Intravenous PRN Stephani PoliceMarianne L York, PA-C      . acetaminophen (TYLENOL) tablet 650 mg  650 mg Oral Q6H PRN Stephani PoliceMarianne L York, PA-C       Or  . acetaminophen (  TYLENOL) suppository 650 mg  650 mg Rectal Q6H PRN Stephani Police, PA-C      . amitriptyline (ELAVIL) tablet 25 mg  25 mg Oral QHS PRN Stephani Police, PA-C      . atorvastatin (LIPITOR) tablet 40 mg  40 mg Oral q1800 Stephani Police, PA-C   40 mg at 08/21/14 1834  . guaiFENesin (MUCINEX) 12 hr tablet 600 mg  600 mg Oral BID Stephani Police, PA-C      . insulin aspart (novoLOG) injection 0-9 Units  0-9 Units Subcutaneous TID WC Marianne L York, PA-C      . iohexol (OMNIPAQUE) 300 MG/ML solution 50 mL  50 mL Oral Once PRN Medication Radiologist, MD      . levalbuterol (XOPENEX) nebulizer solution 0.63 mg  0.63 mg Nebulization Q6H PRN Stephani Police, PA-C      . lubiprostone (AMITIZA) capsule 24 mcg  24 mcg Oral BID WC Stephani Police, PA-C   24 mcg at 08/21/14 1834  . multivitamin (RENA-VIT) tablet 1 tablet  1 tablet Oral QHS Tora Kindred York, PA-C      . ondansetron (ZOFRAN) tablet 4 mg  4 mg Oral Q6H PRN Stephani Police, PA-C       Or  . ondansetron (ZOFRAN) injection 4 mg  4 mg Intravenous Q6H PRN Stephani Police, PA-C      . piperacillin-tazobactam (ZOSYN) IVPB 2.25 g  2.25 g Intravenous 3 times per day Herby Abraham, RPH      . sodium chloride 0.9 % injection 3 mL  3 mL Intravenous Q12H Marianne L York, PA-C      . sodium chloride 0.9 % injection 3 mL  3 mL Intravenous Q12H Marianne L York, PA-C      . sodium chloride 0.9 % injection 3 mL  3 mL Intravenous PRN Stephani Police, PA-C      . vancomycin (VANCOCIN) 2,500 mg in sodium chloride 0.9 % 500 mL IVPB  2,500 mg Intravenous Once Herby Abraham, RPH       Followed by  . [START ON 08/23/2014] vancomycin (VANCOCIN) IVPB 1000 mg/200 mL premix  1,000 mg Intravenous Q M,W,F-HD Herby Abraham, RPH        Allergies as of 08/21/2014  . (No Known Allergies)    Family History  Problem Relation Age of Onset  . Diabetes Mellitus II    . Hypertension    . Hypertension Mother   . Diabetes Mother   . Hypertension Father     History   Social History  . Marital Status: Married    Spouse Name: N/A    Number of Children: N/A  . Years of Education: N/A   Occupational History  . Not on file.   Social History Main Topics  . Smoking status: Never Smoker   . Smokeless tobacco: Former Neurosurgeon    Quit date: 11/28/1962  . Alcohol Use: No  . Drug Use: No  . Sexual Activity: Not on file   Other Topics Concern  . Not on file   Social History Narrative  . No narrative on file    Review of Systems: Pertinent for dyspnea on mild exertion. No chest pain, no cough, no resting dyspnea, no neurologic symptoms, no skin problems, no joint complaints. I don't believe there has been  any significant upper GI tract symptoms  Physical Exam: Vital signs in last 24 hours: Temp:  [97.6 F (36.4 C)] 97.6 F (36.4  C) (10/14 1222) Pulse Rate:  [80-102] 80 (10/14 1554) Resp:  [18-25] 18 (10/14 1522) BP: (99-121)/(63-72) 105/66 mmHg (10/14 1554) SpO2:  [90 %-98 %] 96 % (10/14 1554) Last BM Date: 08/21/14 General:   Alert,  Well-developed, well-nourished, pleasant and cooperative in NAD Head:  Normocephalic and atraumatic. Eyes:  Sclera clear, no icterus.   Conjunctiva pale. Mouth:   No ulcerations or lesions.  Oropharynx pink & moist. Neck:   No masses or thyromegaly. Neck is quite "thick" consistent with adiposity area  Lungs:  Clear throughout to auscultation.   No wheezes, crackles, or rhonchi. No evident respiratory distress. Heart:   Regular rate and rhythm; no murmurs, clicks, rubs,  or gallops. Abdomen:  Soft, nontender, protuberant but nondistended. No masses, hepatosplenomegaly or ventral hernias noted. No guarding, or rebound.   Msk:   Symmetrical without gross deformities. Pulses:  Normal radial pulse noted. Extremities:   Without clubbing, cyanosis, or edema. Neurologic:  Alert and coherent;  grossly normal neurologically. Skin:  Intact without significant lesions or rashes. Cervical Nodes:  No significant cervical adenopathy. Psych:   Alert and cooperative. Normal mood and affect.  Intake/Output from previous day:   Intake/Output this shift:    Lab Results:  Recent Labs  08/21/14 1325  WBC 8.0  HGB 11.0*  HCT 33.4*  PLT 239   BMET  Recent Labs  08/21/14 1325  NA 144  K 3.3*  CL 100  CO2 27  GLUCOSE 124*  BUN 16  CREATININE 3.76*  CALCIUM 9.3   LFT  Recent Labs  08/21/14 1325  PROT 8.3  ALBUMIN 3.3*  AST 22  ALT 16  ALKPHOS 155*  BILITOT 0.8   PT/INR No results found for this basename: LABPROT, INR,  in the last 72 hours  Studies/Results: Ct Abdomen Pelvis Wo Contrast  08/21/2014   CLINICAL DATA:  Rectal bleeding,  diffuse abdominal pain, weakness  EXAM: CT ABDOMEN AND PELVIS WITHOUT CONTRAST  TECHNIQUE: Multidetector CT imaging of the abdomen and pelvis was performed following the standard protocol without IV contrast.  COMPARISON:  None.  FINDINGS: Lung bases shows 8 mm nodule in right lower lobe posteriorly. Follow-up CT scan in 3-6 months is recommended to assure stability.  The study is limited without IV contrast. There is mild perihepatic ascites. No calcified gallstones are noted within gallbladder.  Unenhanced pancreas, spleen and adrenal glands are unremarkable. Unenhanced kidneys are atrophic smaller in size. A cyst in midpole of the right kidney measures 1.1 cm. There is a cyst in lower pole of the left kidney measures 1.4 cm. Exophytic cyst in lower pole of the left kidney measures 1 cm.  No small bowel obstruction.  There is a small midline supraumbilical hernia containing omental fat and small amount of fluid without evidence of acute complication measures 2.6 by 2.3 cm.  There is extensive pneumatosis right colonic wall. Axial image 41 there is a linear air air structure extraluminal probable air within colonic vein. Axial image 37 there is small amount of extraluminal air anterior to the colon concerning for small pneumoperitoneum. Findings are highly concerning for segmental colitis probable ischemic starting from the cecum to the level of hepatic flexure . Clinical correlation is necessary.  Oral contrast material was given to the patient. No small bowel obstruction. The terminal ileum is small caliber. No pericecal inflammation. Normal appendix.  No distal colonic obstruction. The urinary bladder is empty limiting its assessment. Prostate gland and seminal vesicles are unremarkable. Bilateral inguinal lymph nodes  are noted. Largest right inguinal lymph node measures 2.2 x 1.5 cm. Largest left inguinal lymph node measures 2 by 1.6 cm. Clinical correlation is necessary to exclude adenopathy.  There is a right  diaphragmatic lymph node measures 1.3 cm probable pathologic.  Sagittal images of the spine shows degenerative changes lumbar spine.  No hydronephrosis or hydroureter.  IMPRESSION: 1. There is segmental pneumatosis in right colonic wall. Small amount of extraluminal air anterior to the colon concerning for small pneumoperitoneum. There is linear air medial to the colon probable within colonic vein. Findings are highly suspicious for segmental colitis probable ischemic. Clinical correlation is necessary. 2. No small bowel obstruction. 3. 8 mm nodule in right lower lobe posteriorly. Follow-up CT scan in 3-6 months is recommended to assure stability. There is mild enlarged lymph node in right diaphragm/right cardiophrenic angle. Pathologic adenopathy cannot be excluded. 4. Bilateral atrophic kidneys. No hydronephrosis or hydroureter. Bilateral renal cyst. 5. Bilateral inguinal enlarged lymph nodes. Clinical correlation is necessary to exclude inguinal adenopathy. 6. No distal colonic obstruction. 7. Discussed with Dr. Jonnie Kind   Electronically Signed   By: Natasha Mead M.D.   On: 08/21/2014 18:38   Dg Chest 2 View  08/21/2014   CLINICAL DATA:  Fatigue.  EXAM: CHEST  2 VIEW  COMPARISON:  None.  FINDINGS: Mediastinum and hilar structures are normal. Cardiomegaly. Pulmonary vascularity normal. Mild right basilar atelectasis versus infiltrate. No pleural effusion or pneumothorax. Degenerative changes thoracic spine P  IMPRESSION: 1.  Mild right base subsegmental atelectasis versus infiltrate.  2.  Stable cardiomegaly, no CHF.   Electronically Signed   By: Maisie Fus  Register   On: 08/21/2014 14:51    Impression: Although the patient's bleeding itself sounded like it was of rectal outlet origin, the alteration in bowel habit and the abdominal discomfort in association with defecation are consistent with a colitis process, which has been confirmed by CT scan. Based on the patient's risk factors including diabetes and CHF, I  think that ischemic colitis is a strong possibility.  Plan: I agree with moving the patient to a step down unit and antibiotics as already ordered by the hospitalist physician. At the time of my examination, the patient was not "toxic" and had a remarkably benign abdominal exam. Therefore, I do not think surgical consultation is needed tonight, but I would certainly continue to observe this patient closely for worsening pain, worsening exam, worsening vitals, or worsening labs. I do not think that colonoscopy would be prudent, except as a prelude to surgery, because it could place the patient at risk for frank perforation of the colon, and it would be unlikely to alter management.    LOS: 0 days   Andriel Omalley V  08/21/2014, 7:35 PM

## 2014-08-21 NOTE — Progress Notes (Signed)
Report called to 2C RN  

## 2014-08-21 NOTE — Significant Event (Signed)
Called by Dr. Ruffin Frederick of radiology, critical results with pneumatosis coli, and small pneumopertoneum. Spoke with Dr. Lindie Spruce, recommended to keep pt NPO. I transferred the pt to SDU, started him on Zosyn and Vancomycin. I communicated the findings over the phone to Mrs and Mrs Arduini.  Clint Lipps Pager: [phone] 08/21/2014, 6:59 PM

## 2014-08-21 NOTE — ED Notes (Signed)
IV team at bedside to deaccess HD L arm

## 2014-08-21 NOTE — ED Provider Notes (Signed)
CSN: [id number]     Arrival date & time 08/21/14  1156 History   First MD Initiated Contact with Patient 08/21/14 1159     Chief Complaint  Patient presents with  . Fatigue     (Consider location/radiation/quality/duration/timing/severity/associated sxs/prior Treatment) Patient is a 68 y.o. male presenting with weakness. The history is provided by the patient.  Weakness This is a new problem. The current episode started more than 2 days ago. The problem occurs constantly. The problem has not changed since onset.Associated symptoms include shortness of breath (chronic). Pertinent negatives include no chest pain and no abdominal pain. Nothing aggravates the symptoms. Nothing relieves the symptoms.    Past Medical History  Diagnosis Date  . Obesity   . Gout   . Chest pain 03/05/2014  . Shortness of breath   . Arthritis     ALL OVER  . Diabetes mellitus without complication dx 1965  . Hypertension dx 1965  . ESRD on hemodialysis Dx 2005    [facility], [name], ESRD due to DM/HTN, started dialysis in Sep 12, 2012  . Renal failure    Past Surgical History  Procedure Laterality Date  . Insertion of dialysis catheter  09/15/2012    Procedure: INSERTION OF DIALYSIS CATHETER;  Surgeon: Chuck Hinthristopher S Dickson, MD;  [facility];  Service: Vascular;  Laterality: N/A;  . Menisectomy      L knee  . Av fistula placement Left    Family History  Problem Relation Age of Onset  . Diabetes Mellitus II    . Hypertension    . Hypertension Mother   . Diabetes Mother   . Hypertension Father    History  Substance Use Topics  . Smoking status: Never Smoker   . Smokeless tobacco: Former NeurosurgeonUser    Quit date: 11/28/1962  . Alcohol Use: No    Review of Systems  Constitutional: Negative for fever and chills.  Respiratory: Positive for shortness of breath (chronic). Negative for cough.   Cardiovascular: Negative for chest pain.  Gastrointestinal: Positive for anal bleeding. Negative for vomiting  and abdominal pain.  Neurological: Positive for weakness.  All other systems reviewed and are negative.     Allergies  Review of patient's allergies indicates no known allergies.  Home Medications   Prior to Admission medications   Medication Sig Start Date End Date Taking? Authorizing Provider  allopurinol (ZYLOPRIM) 100 MG tablet Take 100 mg by mouth daily. 06/25/14  Yes Historical Provider, MD  amitriptyline (ELAVIL) 25 MG tablet Take 25 mg by mouth at bedtime as needed for sleep.    Yes Historical Provider, MD  atorvastatin (LIPITOR) 40 MG tablet Take 1 tablet (40 mg total) by mouth daily at 6 PM. 03/07/14  Yes Ricki RodriguezAjay S Kadakia, MD  b complex-vitamin c-folic acid (NEPHRO-VITE) 0.8 MG TABS tablet Take 0.8 mg by mouth daily.    Yes Historical Provider, MD  lanthanum (FOSRENOL) 1000 MG chewable tablet Chew 1,000 mg by mouth 2 (two) times daily with a meal.   Yes Historical Provider, MD  linagliptin (TRADJENTA) 5 MG TABS tablet Take 1 tablet (5 mg total) by mouth daily. 08/08/14  Yes Josalyn C Funches, MD  metoprolol tartrate (LOPRESSOR) 25 MG tablet Take 25 mg by mouth daily.  08/11/14  Yes Historical Provider, MD  Nutritional Supplements (FEEDING SUPPLEMENT, NEPRO CARB STEADY,) LIQD Take 237 mLs by mouth as needed (missed meal during dialysis.). 07/26/14  Yes Russella DarAllison L Ellis, NP   BP 99/68  Pulse 95  Temp(Src) 97.6 F (36.4 C) (Oral)  Resp 25  SpO2 87% Physical Exam  Nursing note and vitals reviewed. Constitutional: He is oriented to person, place, and time. He appears well-developed and well-nourished. No distress.  HENT:  Head: Normocephalic and atraumatic.  Mouth/Throat: No oropharyngeal exudate.  Eyes: EOM are normal. Pupils are equal, round, and reactive to light.  Neck: Normal range of motion. Neck supple.  Cardiovascular: Normal rate and regular rhythm.  Exam reveals no friction rub.   No murmur heard. Pulmonary/Chest: Effort normal and breath sounds normal. No respiratory  distress. He has no wheezes. He has no rales.  Abdominal: He exhibits no distension. There is no tenderness. There is no rebound.  Genitourinary: Rectal exam shows tenderness. Guaiac positive stool.  Musculoskeletal: Normal range of motion. He exhibits no edema.  Neurological: He is alert and oriented to person, place, and time.  Skin: He is not diaphoretic.    ED Course  Procedures (including critical care time) Labs Review Labs Reviewed  CBC  COMPREHENSIVE METABOLIC PANEL  TYPE AND SCREEN    Imaging Review Dg Chest 2 View  08/21/2014   CLINICAL DATA:  Fatigue.  EXAM: CHEST  2 VIEW  COMPARISON:  None.  FINDINGS: Mediastinum and hilar structures are normal. Cardiomegaly. Pulmonary vascularity normal. Mild right basilar atelectasis versus infiltrate. No pleural effusion or pneumothorax. Degenerative changes thoracic spine P  IMPRESSION: 1.  Mild right base subsegmental atelectasis versus infiltrate.  2.  Stable cardiomegaly, no CHF.   Electronically Signed   By: Maisie Fus  Register   On: 08/21/2014 14:51     EKG Interpretation   Date/Time:  Wednesday August 21 2014 12:20:25 EDT Ventricular Rate:  97 PR Interval:    QRS Duration: 142 QT Interval:  415 QTC Calculation: 527 R Axis:   -82 Text Interpretation:  Atrial fibrillation Ventricular premature complex  Right bundle branch block Afib old, similar to prior EKGs Confirmed by  [facility]  MD, Sears Oran ([id number]) on 08/21/2014 12:41:53 PM      MDM   Final diagnoses:  Rectal bleeding  Weakness generalized  Lung infiltrate    27M here with weakness. Fatigue for past several days. Has been doing normal dialysis, full treatment today. Having painless hematochezia today, denies pain with BM. Patient here denies CP, reports some chronic SOB with exertion. Had BM with brown stool and BRBPR. Patient very tender on rectal exam, did not allow for full exam, I thought I saw hemorrhoids, no hard, thrombosed hemorrhoid appreciated. Will check  labs. Hemoglobin stable. I spoke with Dr. Matthias Hughs of [facility], who will evaluate patient. Initially on the phone he thinks patient will need sigmoidoscopy for evaluation. CXR shows infiltrate vs atelectasis - with his fatigue will get cultures and give levaquin.   Elwin Mocha, MD 08/21/14 1500

## 2014-08-21 NOTE — Progress Notes (Signed)
Deaccess HD cath,no bleeding noted,pt tolerated well,drsg applied.

## 2014-08-21 NOTE — Progress Notes (Addendum)
Late Entry Admission note:  Arrival Method: Stretcher from ED Mental Orientation:A&O X4 Telemetry: Box 12 Assessment: See docflowsheet Skin: Dry,Intact IV: R Ace Pain: Denies pain Tubes: N/A Safety Measures: High fall risk interventions implemented. Fall Prevention Safety Plan: Reviewed with Pt Admission Screening: Completed 6700 Orientation: Patient has been oriented to the unit, staff and to the room.

## 2014-08-21 NOTE — Progress Notes (Signed)
ANTIBIOTIC CONSULT NOTE - INITIAL  Pharmacy Consult for vancomycin and zosyn Indication: pneumatosis in right colonic wall   No Known Allergies  Patient Measurements:  TBW 132.5 kg on 08/08/14 Adjusted Body Weight: 102 kg  Vital Signs: Temp: 97.6 F (36.4 C) (10/14 1222) Temp Source: Oral (10/14 1222) BP: 105/66 mmHg (10/14 1554) Pulse Rate: 80 (10/14 1554) Intake/Output from previous day:   Intake/Output from this shift:    Labs:  Recent Labs  08/21/14 1325  WBC 8.0  HGB 11.0*  PLT 239  CREATININE 3.76*   The CrCl is unknown because both a height and weight (above a minimum accepted value) are required for this calculation. No results found for this basename: VANCOTROUGH, Leodis Binet, VANCORANDOM, GENTTROUGH, GENTPEAK, GENTRANDOM, TOBRATROUGH, TOBRAPEAK, TOBRARND, AMIKACINPEAK, AMIKACINTROU, AMIKACIN,  in the last 72 hours   Microbiology: Recent Results (from the past 720 hour(s))  CULTURE, BLOOD (ROUTINE X 2)     Status: None   Collection Time    07/23/14  4:40 PM      Result Value Ref Range Status   Specimen Description BLOOD RIGHT FOREARM   Final   Special Requests BOTTLES DRAWN AEROBIC AND ANAEROBIC 9CC   Final   Culture  Setup Time     Final   Value: 07/23/2014 22:33     Performed at [facility]   Culture     Final   Value: NO GROWTH 5 DAYS     Performed at [facility]   Report Status 07/29/2014 FINAL   Final  CULTURE, BLOOD (ROUTINE X 2)     Status: None   Collection Time    07/23/14  4:50 PM      Result Value Ref Range Status   Specimen Description BLOOD ARM RIGHT   Final   Special Requests BOTTLES DRAWN AEROBIC AND ANAEROBIC 10CC   Final   Culture  Setup Time     Final   Value: 07/23/2014 22:30     Performed at [facility]   Culture     Final   Value: NO GROWTH 5 DAYS     Performed at [facility]   Report Status 07/29/2014 FINAL   Final  MRSA PCR SCREENING     Status: None   Collection Time    07/23/14  9:29  PM      Result Value Ref Range Status   MRSA by PCR NEGATIVE  NEGATIVE Final   Comment:            The GeneXpert MRSA Assay (FDA     approved for NASAL specimens     only), is one component of a     comprehensive MRSA colonization     surveillance program. It is not     intended to diagnose MRSA     infection nor to guide or     monitor treatment for     MRSA infections.    Medical History: Past Medical History  Diagnosis Date  . Obesity   . Gout   . Chest pain 03/05/2014  . Shortness of breath   . Arthritis     ALL OVER  . Diabetes mellitus without complication dx 1965  . Hypertension dx 1965  . ESRD on hemodialysis Dx 2005    [facility], [name], ESRD due to DM/HTN, started dialysis in Sep 12, 2012  . Renal failure     Medications:  Prescriptions prior to admission  Medication Sig Dispense Refill  . allopurinol (ZYLOPRIM)  100 MG tablet Take 100 mg by mouth daily.      Marland Kitchen. amitriptyline (ELAVIL) 25 MG tablet Take 25 mg by mouth at bedtime as needed for sleep.       Marland Kitchen. atorvastatin (LIPITOR) 40 MG tablet Take 1 tablet (40 mg total) by mouth daily at 6 PM.  30 tablet  2  . b complex-vitamin c-folic acid (NEPHRO-VITE) 0.8 MG TABS tablet Take 0.8 mg by mouth daily.       Marland Kitchen. lanthanum (FOSRENOL) 1000 MG chewable tablet Chew 1,000 mg by mouth 2 (two) times daily with a meal.      . linagliptin (TRADJENTA) 5 MG TABS tablet Take 1 tablet (5 mg total) by mouth daily.  30 tablet  5  . metoprolol tartrate (LOPRESSOR) 25 MG tablet Take 25 mg by mouth daily.       . Nutritional Supplements (FEEDING SUPPLEMENT, NEPRO CARB STEADY,) LIQD Take 237 mLs by mouth as needed (missed meal during dialysis.).  6 Can  prn   Assessment: 68 yo man with pneumatosis in right colonic wall.  Findings are highly concerning for segmental colitis probable ischemic starting from the cecum to the level of hepatic flexure. Pharmacy consulted to dose vancomycin and zosyn.   ESRD - HD on MWF.  Wt 132.5 kg.  ABW 102 kg.   WBC 8. Afebrile.  Levaquin 750 x 1 10/14 vanc 10/14>> Zosyn 10/14>>  10/14 BCx2>>    Goal of Therapy:  Pre-HD vancomycin level 15-25 mcg/ml  Plan:  -zosyn 2.25 gm IV q8h -vancomycin 2500 mg IV loading dose, then vancomycin 1000 mg after each HD on MWF -f/u wbc, temp, clinical course -pre-HD vanc level as needed  Herby AbrahamMichelle T. Hervey Wedig, Pharm.D. [phone] 08/21/2014 7:08 PM

## 2014-08-21 NOTE — H&P (Signed)
[facility] History and Physical                                                                                    Patient Demographics  Nicolas Mccann, is a 68 y.o. male  MRN: [id number]   DOB - Jan 23, 1946  Admit Date - 08/21/2014  Outpatient Primary MD for the patient is Lora Paula, MD   With History of -  Past Medical History  Diagnosis Date  . Obesity   . Gout   . Chest pain 03/05/2014  . Shortness of breath   . Arthritis     ALL OVER  . Diabetes mellitus without complication dx 1965  . Hypertension dx 1965  . ESRD on hemodialysis Dx 2005    [facility], [name], ESRD due to DM/HTN, started dialysis in Sep 12, 2012  . Renal failure       Past Surgical History  Procedure Laterality Date  . Insertion of dialysis catheter  09/15/2012    Procedure: INSERTION OF DIALYSIS CATHETER;  Surgeon: Chuck Hint, MD;  Location: [facility] OR;  Service: Vascular;  Laterality: N/A;  . Menisectomy      L knee  . Av fistula placement Left     in for   Chief Complaint  Patient presents with  . Fatigue     HPI  Nicolas Mccann  is a 68 y.o. male, history significant for end-stage renal disease (on dialysis Monday Wednesday Friday), A. fib, CHF (EF 35-40% in April of 2015). He had a recent admission for orthostatic hypotension (discharged September 18). He presents to the ER after hemodialysis today with rectal bleeding. He complains of brown stool with bright red blood that he has been experiencing for the past 4 days. He says he has had be regular bowel movements with each meal after taking Fosrenol and denies any constipation or diarrhea. He denies melena. His appetite is good. He has never had a colonoscopy.  He also complains of worsening shortness of breath, productive cough (with white sputum) and PND.  In the emergency department he appears stable. Hemoglobin is 11.0, weight count not elevated, afebrile, x-ray shows atelectasis versus infiltrate. The ED physician has ordered a CT  abdomen pelvis, consulted [facility], and ordered blood cultures. He will be admitted to telemetry for rectal bleeding.  Review of Systems    In addition to the HPI above,  No Fever-chills, No Headache, No changes with Vision or hearing, No problems swallowing food or Liquids, No Chest pain, ++ He has chronic SOB which he feels is stable. No Abdominal pain, No Nausea or Vomiting No new skin rashes or bruises, No new joints pains-aches,  No new weakness, tingling, numbness in any extremity, No recent weight gain or loss, No polyuria, polydypsia or polyphagia,  A full 10 point Review of Systems was done, except as stated above, all other Review of Systems were negative.   Social History History  Substance Use Topics  . Smoking status: Never Smoker   . Smokeless tobacco: Former Neurosurgeon    Quit date: 11/28/1962  . Alcohol Use: No  Stopped smoking cigars approximately 30 years ago.   Family History  Family History  Problem Relation Age of Onset  . Diabetes Mellitus II    . Hypertension    . Hypertension Mother   . Diabetes Mother   . Hypertension Father      Prior to Admission medications   Medication Sig Start Date End Date Taking? Authorizing Provider  allopurinol (ZYLOPRIM) 100 MG tablet Take 100 mg by mouth daily. 06/25/14  Yes Historical Provider, MD  amitriptyline (ELAVIL) 25 MG tablet Take 25 mg by mouth at bedtime as needed for sleep.    Yes Historical Provider, MD  atorvastatin (LIPITOR) 40 MG tablet Take 1 tablet (40 mg total) by mouth daily at 6 PM. 03/07/14  Yes Ricki Rodriguez, MD  b complex-vitamin c-folic acid (NEPHRO-VITE) 0.8 MG TABS tablet Take 0.8 mg by mouth daily.    Yes Historical Provider, MD  lanthanum (FOSRENOL) 1000 MG chewable tablet Chew 1,000 mg by mouth 2 (two) times daily with a meal.   Yes Historical Provider, MD  linagliptin (TRADJENTA) 5 MG TABS tablet Take 1 tablet (5 mg total) by mouth daily. 08/08/14  Yes Josalyn C Funches, MD  metoprolol tartrate  (LOPRESSOR) 25 MG tablet Take 25 mg by mouth daily.  08/11/14  Yes Historical Provider, MD  Nutritional Supplements (FEEDING SUPPLEMENT, NEPRO CARB STEADY,) LIQD Take 237 mLs by mouth as needed (missed meal during dialysis.). 07/26/14  Yes Russella Dar, NP    No Known Allergies  Physical Exam  Vitals  Blood pressure 105/66, pulse 80, temperature 97.6 F (36.4 C), temperature source Oral, resp. rate 18, SpO2 96.00%.   General:  lying in bed in NAD, A&O with clear speech.  Reports that he was told by HD he had to come to the hospital.  Psych:  Normal affect and insight, Not Suicidal or Homicidal, Awake Alert, Oriented X 3.  Neuro:   No F.N deficits, ALL C.Nerves Intact, Strength 5/5 all 4 extremities, Sensation intact all 4 extremities.  ENT:  Ears and Eyes appear Normal, Conjunctivae clear, PERRLA. Moist Oral Mucosa.  Neck:  Supple Neck, No cervical lymphadenopathy appreciated  Respiratory:  Symmetrical Chest wall movement, Good air movement bilaterally, CTAB.  Cardiac:  RRR, No Gallops, + systolic Murmur , No Parasternal Heave.  Abdomen:  Obese, Positive Bowel Sounds, Abdomen Soft, Non tender, No organomegaly appreciated  Skin:  No Cyanosis, Normal Skin Turgor, No Skin Rash or Bruise.  Extremities:  Good muscle tone,  joints appear normal , no effusions, Normal ROM. 1-2+ LE Edema bilaterally.   Data Review  CBC  Recent Labs Lab 08/21/14 1325  WBC 8.0  HGB 11.0*  HCT 33.4*  PLT 239  MCV 106.0*  MCH 34.9*  MCHC 32.9  RDW 15.2   ------------------------------------------------------------------------------------------------------------------  Chemistries   Recent Labs Lab 08/21/14 1325  NA 144  K 3.3*  CL 100  CO2 27  GLUCOSE 124*  BUN 16  CREATININE 3.76*  CALCIUM 9.3  AST 22  ALT 16  ALKPHOS 155*  BILITOT 0.8    Urinalysis    Component Value Date/Time   COLORURINE YELLOW 10/12/2012 0009   APPEARANCEUR CLEAR 10/12/2012 0009   LABSPEC 1.008  10/12/2012 0009   PHURINE 8.0 10/12/2012 0009   GLUCOSEU 100* 10/12/2012 0009   HGBUR MODERATE* 10/12/2012 0009   BILIRUBINUR NEGATIVE 10/12/2012 0009   KETONESUR NEGATIVE 10/12/2012 0009   PROTEINUR 100* 10/12/2012 0009   UROBILINOGEN 0.2 10/12/2012 0009   NITRITE NEGATIVE 10/12/2012 0009   LEUKOCYTESUR TRACE* 10/12/2012 0009     Imaging  results:   Dg Chest 2 View  08/21/2014   CLINICAL DATA:  Fatigue.  EXAM: CHEST  2 VIEW  COMPARISON:  None.  FINDINGS: Mediastinum and hilar structures are normal. Cardiomegaly. Pulmonary vascularity normal. Mild right basilar atelectasis versus infiltrate. No pleural effusion or pneumothorax. Degenerative changes thoracic spine P  IMPRESSION: 1.  Mild right base subsegmental atelectasis versus infiltrate.  2.  Stable cardiomegaly, no CHF.   Electronically Signed   By: Maisie Fushomas  Register   On: 08/21/2014 14:51   Dg Chest 2 View  07/23/2014   CLINICAL DATA:  Short of breath  EXAM: CHEST  2 VIEW  COMPARISON:  03/05/2014  FINDINGS: Cardiomegaly. Normal vascularity. Low volumes. Bibasilar atelectasis. No pleural effusion. No pneumothorax. Central vascular crowding.  IMPRESSION: Cardiomegaly without edema.  Bibasilar atelectasis.   Electronically Signed   By: Maryclare BeanArt  Hoss M.D.   On: 07/23/2014 17:27    My personal review of EKG: Question of Afib vs frequent PACs.      Assessment & Plan  Principal Problem:   Rectal bleeding Active Problems:   CKD (chronic kidney disease) stage V requiring chronic dialysis   Anemia in chronic renal disease   Diabetes mellitus with renal complications   Congestive dilated cardiomyopathy/EF 35-40%   Rectal bleeding  Likely hemorrhoidal.   No significant drop in hgb [facility] consulted by EDP - possible plan for flex sig. CT Abdomen Pelvis ordered by EDP - please follow Placed on renal clears.  CHF with dilated cardiomyopathy - Patient of Dr. Algie CofferKadakia.  Has also seen [facility]. Patient complains of PND, and worsening cough  with clear sputum.  CXR does not show CHF. BNP pending. Saline lock.   Patient not on beta blocker as metoprolol was discontinued during last admission for orthostatic hypotension. Will add oxygen and xopenex nebs PRN for comfort. Consider restarting beta blockers.  Recent admission for Orthostatic hypotension BB and Imdur discontinued in September 2015 Will check orthostatics daily.  Question of Afib on EKG Documentation indicates heart rate went to 140 with EMS. No previous history of Afib in documentation. Reviewed EKG with Cardiology PA who felt it was possibly frequent PACs. Will repeat EKG on the floor and monitor on tele.  Question of PNA CXR with atelectasis vs infiltrate. Patient with no elevation in WBC, afebrile. Blood cultures and levaquin ordered by EDP. Will hold off on continuing antibiotics at this point. If he develops symptoms or further clinical evidence of pna would treat for HCAP. I believe he has some degree of fluid overload either from CHF or ESRD rather than PNA.  ESRD Finished HD on Wednesday. Consulted Nephrology for HD on Friday. Renal diet.  DM SSI - S Recent HGb A1C 7.1   DVT Prophylaxis SCDs. AM Labs Ordered, also please review Full Orders Family Communication:   Wife and daughter at bedside.   Code Status:  full Likely DC to  Home when appropriate (PT ordered for 10/15) Condition:  Guarded.  Time spent in minutes : 60  York, Marianne L PA-C on 08/21/2014 at 4:20 PM Between 7am to 4 pm - Pager - [phone] After 4 pm go to [email or web address] - password [email or web address]    [facility] Office  [phone]   Addendum  Patient seen and examined, chart and data base reviewed.  I agree with the above assessment and plan.  For full details please see Mrs. Algis DownsMarianne York PA note.  I reviewed and amended the above note as appropriate.   Isa Kohlenberg A  Arthor Captain, MD [facility] Pager: [phone] 08/21/2014, 4:28 PM

## 2014-08-21 NOTE — ED Notes (Signed)
Pt in from HD via [facility], pt reported to have finished complete HD tx, M,W, F reported to have diarrhea x 3 days with blood on tissue today during HD tx, pt A&OX4, follows commands, speaks in complete sentences, pt in A  Fib in route with HR 40-130 irregular, pt c/o SOB, denies n/v, HD access in place upon arrival to ED, IV team contacted upon arrival for deaccess

## 2014-08-22 DIAGNOSIS — I951 Orthostatic hypotension: Secondary | ICD-10-CM

## 2014-08-22 LAB — BASIC METABOLIC PANEL
Anion gap: 16 — ABNORMAL HIGH (ref 5–15)
BUN: 21 mg/dL (ref 6–23)
CHLORIDE: 100 meq/L (ref 96–112)
CO2: 25 mEq/L (ref 19–32)
Calcium: 9.5 mg/dL (ref 8.4–10.5)
Creatinine, Ser: 4.61 mg/dL — ABNORMAL HIGH (ref 0.50–1.35)
GFR calc Af Amer: 14 mL/min — ABNORMAL LOW (ref >90)
GFR calc non Af Amer: 12 mL/min — ABNORMAL LOW (ref >90)
Glucose, Bld: 116 mg/dL — ABNORMAL HIGH (ref 70–99)
POTASSIUM: 3.9 meq/L (ref 3.7–5.3)
SODIUM: 141 meq/L (ref 137–147)

## 2014-08-22 LAB — GLUCOSE, CAPILLARY
GLUCOSE-CAPILLARY: 131 mg/dL — AB (ref 70–99)
GLUCOSE-CAPILLARY: 91 mg/dL (ref 70–99)
Glucose-Capillary: 110 mg/dL — ABNORMAL HIGH (ref 70–99)
Glucose-Capillary: 154 mg/dL — ABNORMAL HIGH (ref 70–99)
Glucose-Capillary: 107 mg/dL — ABNORMAL HIGH (ref 70–99)

## 2014-08-22 LAB — CBC
HCT: 31.5 % — ABNORMAL LOW (ref 39.0–52.0)
HEMOGLOBIN: 10.5 g/dL — AB (ref 13.0–17.0)
MCH: 35.4 pg — ABNORMAL HIGH (ref 26.0–34.0)
MCHC: 33.3 g/dL (ref 30.0–36.0)
MCV: 106.1 fL — ABNORMAL HIGH (ref 78.0–100.0)
PLATELETS: 222 10*3/uL (ref 150–400)
RBC: 2.97 MIL/uL — AB (ref 4.22–5.81)
RDW: 15.1 % (ref 11.5–15.5)
WBC: 7.8 10*3/uL (ref 4.0–10.5)

## 2014-08-22 MED ORDER — NEPRO/CARBSTEADY PO LIQD: 237.0000 mL | ORAL | Status: DC | PRN

## 2014-08-22 MED ORDER — HEPARIN SODIUM (PORCINE) 1000 UNIT/ML DIALYSIS
1000.0000 [IU] | INTRAMUSCULAR | Status: DC | PRN
  Filled 2014-08-22: qty 1

## 2014-08-22 MED ORDER — DOXERCALCIFEROL 4 MCG/2ML IV SOLN
6.0000 ug | INTRAVENOUS | Status: DC
  Administered 2014-08-23: 6 ug via INTRAVENOUS
  Filled 2014-08-22: qty 4

## 2014-08-22 MED ORDER — ALTEPLASE 2 MG IJ SOLR
2.0000 mg | Freq: Once | INTRAMUSCULAR | Status: AC | PRN
  Filled 2014-08-22: qty 2

## 2014-08-22 MED ORDER — SODIUM CHLORIDE 0.9 % IV SOLN: 100.0000 mL | INTRAVENOUS | Status: DC | PRN

## 2014-08-22 MED ORDER — LIDOCAINE-PRILOCAINE 2.5-2.5 % EX CREA: 1.0000 "application " | TOPICAL_CREAM | CUTANEOUS | Status: DC | PRN

## 2014-08-22 MED ORDER — SACCHAROMYCES BOULARDII 250 MG PO CAPS
250.0000 mg | ORAL_CAPSULE | Freq: Two times a day (BID) | ORAL | Status: DC
  Administered 2014-08-22 – 2014-08-24 (×4): 250 mg via ORAL
  Filled 2014-08-22 (×5): qty 1

## 2014-08-22 MED ORDER — PENTAFLUOROPROP-TETRAFLUOROETH EX AERO: 1.0000 "application " | INHALATION_SPRAY | CUTANEOUS | Status: DC | PRN

## 2014-08-22 MED ORDER — LIDOCAINE HCL (PF) 1 % IJ SOLN: 5.0000 mL | INTRAMUSCULAR | Status: DC | PRN

## 2014-08-22 NOTE — Progress Notes (Signed)
Pharmacist Heart Failure Core Measure Documentation  Assessment: Nicolas Mccann has an EF documented as 35-40% on March 07, 2014.  Rationale: Heart failure patients with left ventricular systolic dysfunction (LVSD) and an EF < 40% should be prescribed an angiotensin converting enzyme inhibitor (ACEI) or angiotensin receptor blocker (ARB) at discharge unless a contraindication is documented in the medical record.  This patient is not currently on an ACEI or ARB for HF.  This note is being placed in the record in order to provide documentation that a contraindication to the use of these agents is present for this encounter.  ACE Inhibitor or Angiotensin Receptor Blocker is contraindicated (specify all that apply)  []   ACEI allergy AND ARB allergy []   Angioedema []   Moderate or severe aortic stenosis []   Hyperkalemia [x]   Hypotension []   Renal artery stenosis [x]   Worsening renal function, preexisting renal disease or dysfunction   Pt has orthostatic hypotension, all other antihypertensives have been discontinued.  Baldemar Friday 08/22/2014 3:12 PM

## 2014-08-22 NOTE — Evaluation (Signed)
Physical Therapy Evaluation Patient Details Name: Nicolas Mccann MRN: [id number] DOB: [birth year] Today's Date: 08/22/2014   History of Present Illness  pt presents with R colon pneumatosis and SOB.    Clinical Impression  Pt's mobility limited by SOB and fatigue, but very agreeable to mobility.  Pt indicates he ambulates short distances at home and that family is always around with him.  Pt's O2 sats decrease to mid 80's on RA after ambulating, pt placed back on 2L O2.  Will continue to follow.      Follow Up Recommendations Home health PT;Supervision - Intermittent    Equipment Recommendations  None recommended by PT    Recommendations for Other Services       Precautions / Restrictions Precautions Precautions: None Precaution Comments: O2 sats Restrictions Weight Bearing Restrictions: No      Mobility  Bed Mobility Overal bed mobility: Needs Assistance Bed Mobility: Supine to Sit     Supine to sit: Supervision;HOB elevated     General bed mobility comments: pt utilizes bedrails to A with coming to sitting.    Transfers Overall transfer level: Needs assistance Equipment used: None Transfers: Sit to/from Stand Sit to Stand: Supervision         General transfer comment: pt moves well, just seems labored.    Ambulation/Gait Ambulation/Gait assistance: Supervision Ambulation Distance (Feet): 120 Feet Assistive device: None Gait Pattern/deviations: Step-through pattern;Decreased stride length     General Gait Details: pt moves slowly and becomes SOB.  After ambulating pt's O2 sats decrease to mid 80's on RA.  pt placed back on 2L O2.    Stairs            Wheelchair Mobility    Modified Rankin (Stroke Patients Only)       Balance Overall balance assessment: No apparent balance deficits (not formally assessed)                                           Pertinent Vitals/Pain Pain Assessment: No/denies pain    Home Living  Family/patient expects to be discharged to:: Private residence Living Arrangements: Spouse/significant other Available Help at Discharge: Family;Available 24 hours/day Type of Home: House Home Access: Ramped entrance     Home Layout: One level Home Equipment: Walker - 2 wheels      Prior Function Level of Independence: Independent               Hand Dominance   Dominant Hand: Right    Extremity/Trunk Assessment   Upper Extremity Assessment: Overall WFL for tasks assessed           Lower Extremity Assessment: Overall WFL for tasks assessed      Cervical / Trunk Assessment: Normal  Communication   Communication: No difficulties  Cognition Arousal/Alertness: Awake/alert Behavior During Therapy: WFL for tasks assessed/performed Overall Cognitive Status: Within Functional Limits for tasks assessed                      General Comments      Exercises        Assessment/Plan    PT Assessment Patient needs continued PT services  PT Diagnosis Difficulty walking   PT Problem List Decreased activity tolerance;Decreased balance;Decreased mobility;Decreased knowledge of use of DME;Cardiopulmonary status limiting activity  PT Treatment Interventions DME instruction;Gait training;Stair training;Functional mobility training;Therapeutic activities;Therapeutic exercise;Balance training;Patient/family education  PT Goals (Current goals can be found in the Care Plan section) Acute Rehab PT Goals Patient Stated Goal: Home PT Goal Formulation: With patient Time For Goal Achievement: 09/05/14 Potential to Achieve Goals: Good    Frequency Min 3X/week   Barriers to discharge        Co-evaluation               End of Session   Activity Tolerance: Patient limited by fatigue Patient left: in chair;with call bell/phone within reach Nurse Communication: Mobility status         Time: [id number] PT Time Calculation (min): 28 min   Charges:   PT  Evaluation $Initial PT Evaluation Tier I: 1 Procedure PT Treatments $Gait Training: 8-22 mins   PT G CodesSunny Schlein:          Micheale Schlack F, [place]PT [phone] 08/22/2014, 9:51 AM

## 2014-08-22 NOTE — Consult Note (Signed)
I have seen and examined this patient and agree with the plan of care. Plan dialysis tomorrow , heparin held with GI Bleed. Rosetta Rupnow W 08/22/2014, 8:48 PM

## 2014-08-22 NOTE — Consult Note (Signed)
[facility] Renal Consultation Note    Indication for Consultation:  Management of ESRD/hemodialysis; anemia, hypertension/volume and secondary hyperparathyroidism PCP:  HPI: Nicolas Mccann is a 68 y.o. male with ESRD secondary to DM on HD since November 2013, who reports the onset of rectal bleeding this past Monday.  At that time, he just noticed a little on toilet tissue, but dialysis staff noticed it on his clothes yesterday  when he returned from the BR mid dialysis treatment.  He was sent to the ED from dialysis after his HD treatment yesterday. He did attain his edw of 130.5 - pre HD BP were sitting 124/81 and 106/70 standing P was recorded as 55 (usually 90 -100); post BP sitting was 122/104.  Net UF yesterday was 3.5 L; UF Monday was 3.5 as well but he left 1.2 above EDW (total goal had been 5200).  Today he denies any pain. He said he is still having watery stools but no visible blood. He has no SOB at rest and can sleep supine, but some DOE. He is surprised by how sleepy he feels.  He has no N or V, but has not eaten much.  He denies dizziness at present  Past Medical History  Diagnosis Date  . Obesity   . Gout   . Chest pain 03/05/2014  . Shortness of breath   . Arthritis     ALL OVER  . Diabetes mellitus without complication dx 1965  . Hypertension dx 1965  . ESRD on hemodialysis Dx 2005    [facility], [name], ESRD due to DM/HTN, started dialysis in Sep 12, 2012  . Renal failure    Past Surgical History  Procedure Laterality Date  . Insertion of dialysis catheter  09/15/2012    Procedure: INSERTION OF DIALYSIS CATHETER;  Surgeon: Chuck Hint, MD;  Location: [facility] OR;  Service: Vascular;  Laterality: N/A;  . Menisectomy      L knee  . Av fistula placement Left    Family History  Problem Relation Age of Onset  . Diabetes Mellitus II    . Hypertension    . Hypertension Mother   . Diabetes Mother   . Hypertension Father    Social History:  reports that he  has never smoked. He quit smokeless tobacco use about 51 years ago. He reports that he does not drink alcohol or use illicit drugs. No Known Allergies Prior to Admission medications   Medication Sig Start Date End Date Taking? Authorizing Provider  allopurinol (ZYLOPRIM) 100 MG tablet Take 100 mg by mouth daily. 06/25/14  Yes Historical Provider, MD  amitriptyline (ELAVIL) 25 MG tablet Take 25 mg by mouth at bedtime as needed for sleep.    Yes Historical Provider, MD  atorvastatin (LIPITOR) 40 MG tablet Take 1 tablet (40 mg total) by mouth daily at 6 PM. 03/07/14  Yes Ricki Rodriguez, MD  b complex-vitamin c-folic acid (NEPHRO-VITE) 0.8 MG TABS tablet Take 0.8 mg by mouth daily.    Yes Historical Provider, MD  lanthanum (FOSRENOL) 1000 MG chewable tablet Chew 1,000 mg by mouth 2 (two) times daily with a meal.   Yes Historical Provider, MD  linagliptin (TRADJENTA) 5 MG TABS tablet Take 1 tablet (5 mg total) by mouth daily. 08/08/14  Yes Josalyn C Funches, MD  metoprolol tartrate (LOPRESSOR) 25 MG tablet Take 25 mg by mouth daily.  08/11/14  Yes Historical Provider, MD  Nutritional Supplements (FEEDING SUPPLEMENT, NEPRO CARB STEADY,) LIQD Take 237 mLs  by mouth as needed (missed meal during dialysis.). 07/26/14  Yes Russella Dar, NP   Current Facility-Administered Medications  Medication Dose Route Frequency Provider Last Rate Last Dose  . 0.9 %  sodium chloride infusion  250 mL Intravenous PRN Stephani Police, PA-C      . acetaminophen (TYLENOL) tablet 650 mg  650 mg Oral Q6H PRN Stephani Police, PA-C       Or  . acetaminophen (TYLENOL) suppository 650 mg  650 mg Rectal Q6H PRN Stephani Police, PA-C      . amitriptyline (ELAVIL) tablet 25 mg  25 mg Oral QHS PRN Stephani Police, PA-C      . atorvastatin (LIPITOR) tablet 40 mg  40 mg Oral q1800 Stephani Police, PA-C   40 mg at 08/21/14 1834  . guaiFENesin (MUCINEX) 12 hr tablet 600 mg  600 mg Oral BID Stephani Police, PA-C   600 mg at 08/22/14 2956  .  insulin aspart (novoLOG) injection 0-9 Units  0-9 Units Subcutaneous TID WC Marianne L York, PA-C      . levalbuterol (XOPENEX) nebulizer solution 0.63 mg  0.63 mg Nebulization Q6H PRN Stephani Police, PA-C      . lubiprostone (AMITIZA) capsule 24 mcg  24 mcg Oral BID WC Stephani Police, PA-C   24 mcg at 08/22/14 2130  . multivitamin (RENA-VIT) tablet 1 tablet  1 tablet Oral QHS Stephani Police, PA-C   1 tablet at 08/21/14 2210  . ondansetron (ZOFRAN) tablet 4 mg  4 mg Oral Q6H PRN Stephani Police, PA-C       Or  . ondansetron [facility]) injection 4 mg  4 mg Intravenous Q6H PRN Stephani Police, PA-C      . piperacillin-tazobactam (ZOSYN) IVPB 2.25 g  2.25 g Intravenous 3 times per day Herby Abraham, RPH   2.25 g at 08/22/14 0527  . sodium chloride 0.9 % injection 3 mL  3 mL Intravenous Q12H Stephani Police, PA-C   3 mL at 08/21/14 2212  . sodium chloride 0.9 % injection 3 mL  3 mL Intravenous Q12H Stephani Police, PA-C   3 mL at 08/22/14 0959  . sodium chloride 0.9 % injection 3 mL  3 mL Intravenous PRN Stephani Police, PA-C      . [START ON 08/23/2014] vancomycin (VANCOCIN) IVPB 1000 mg/200 mL premix  1,000 mg Intravenous Q M,W,F-HD Herby Abraham, [facility]       Labs: Basic Metabolic Panel:  Recent Labs Lab 08/21/14 1325 08/22/14 0329  NA 144 141  K 3.3* 3.9  CL 100 100  CO2 27 25  GLUCOSE 124* 116*  BUN 16 21  CREATININE 3.76* 4.61*  CALCIUM 9.3 9.5   Liver Function Tests:  Recent Labs Lab 08/21/14 1325  AST 22  ALT 16  ALKPHOS 155*  BILITOT 0.8  PROT 8.3  ALBUMIN 3.3*  CBC:  Recent Labs Lab 08/21/14 1325 08/22/14 0329  WBC 8.0 7.8  HGB 11.0* 10.5*  HCT 33.4* 31.5*  MCV 106.0* 106.1*  PLT 239 222  CBG:  Recent Labs Lab 08/21/14 1641 08/21/14 2002 08/21/14 2231 08/22/14 0746  GLUCAP 98 155* 116* 110*   Studies/Results: Ct Abdomen Pelvis Wo Contrast  08/21/2014   CLINICAL DATA:  Rectal bleeding, diffuse abdominal pain, weakness  EXAM: CT ABDOMEN AND PELVIS  WITHOUT CONTRAST  TECHNIQUE: Multidetector CT imaging of the abdomen and pelvis was performed following the standard protocol without IV contrast.  COMPARISON:  None.  FINDINGS: Lung bases shows 8 mm nodule in right lower lobe posteriorly. Follow-up CT scan in 3-6 months is recommended to assure stability.  The study is limited without IV contrast. There is mild perihepatic ascites. No calcified gallstones are noted within gallbladder.  Unenhanced pancreas, spleen and adrenal glands are unremarkable. Unenhanced kidneys are atrophic smaller in size. A cyst in midpole of the right kidney measures 1.1 cm. There is a cyst in lower pole of the left kidney measures 1.4 cm. Exophytic cyst in lower pole of the left kidney measures 1 cm.  No small bowel obstruction.  There is a small midline supraumbilical hernia containing omental fat and small amount of fluid without evidence of acute complication measures 2.6 by 2.3 cm.  There is extensive pneumatosis right colonic wall. Axial image 41 there is a linear air air structure extraluminal probable air within colonic vein. Axial image 37 there is small amount of extraluminal air anterior to the colon concerning for small pneumoperitoneum. Findings are highly concerning for segmental colitis probable ischemic starting from the cecum to the level of hepatic flexure . Clinical correlation is necessary.  Oral contrast material was given to the patient. No small bowel obstruction. The terminal ileum is small caliber. No pericecal inflammation. Normal appendix.  No distal colonic obstruction. The urinary bladder is empty limiting its assessment. Prostate gland and seminal vesicles are unremarkable. Bilateral inguinal lymph nodes are noted. Largest right inguinal lymph node measures 2.2 x 1.5 cm. Largest left inguinal lymph node measures 2 by 1.6 cm. Clinical correlation is necessary to exclude adenopathy.  There is a right diaphragmatic lymph node measures 1.3 cm probable pathologic.   Sagittal images of the spine shows degenerative changes lumbar spine.  No hydronephrosis or hydroureter.  IMPRESSION: 1. There is segmental pneumatosis in right colonic wall. Small amount of extraluminal air anterior to the colon concerning for small pneumoperitoneum. There is linear air medial to the colon probable within colonic vein. Findings are highly suspicious for segmental colitis probable ischemic. Clinical correlation is necessary. 2. No small bowel obstruction. 3. 8 mm nodule in right lower lobe posteriorly. Follow-up CT scan in 3-6 months is recommended to assure stability. There is mild enlarged lymph node in right diaphragm/right cardiophrenic angle. Pathologic adenopathy cannot be excluded. 4. Bilateral atrophic kidneys. No hydronephrosis or hydroureter. Bilateral renal cyst. 5. Bilateral inguinal enlarged lymph nodes. Clinical correlation is necessary to exclude inguinal adenopathy. 6. No distal colonic obstruction. 7. Discussed with Dr. Jonnie KindAlmahi   Electronically Signed   By: Natasha MeadLiviu  Pop M.D.   On: 08/21/2014 18:38   Dg Chest 2 View  08/21/2014   CLINICAL DATA:  Fatigue.  EXAM: CHEST  2 VIEW  COMPARISON:  None.  FINDINGS: Mediastinum and hilar structures are normal. Cardiomegaly. Pulmonary vascularity normal. Mild right basilar atelectasis versus infiltrate. No pleural effusion or pneumothorax. Degenerative changes thoracic spine P  IMPRESSION: 1.  Mild right base subsegmental atelectasis versus infiltrate.  2.  Stable cardiomegaly, no CHF.   Electronically Signed   By: Maisie Fushomas  Register   On: 08/21/2014 14:51    ROS: As per HPI otherwise negative. Physical Exam: Filed Vitals:   08/22/14 0350 08/22/14 0352 08/22/14 0355 08/22/14 0749  BP: 115/76 132/89 132/89 102/84  Pulse: 96 93 103 71  Temp: 97.8 F (36.6 C) 97.8 F (36.6 C) 97.8 F (36.6 C) 97.8 F (36.6 C)  TempSrc: Oral Oral Oral Oral  Resp: 21 25 20 16   Height:  Weight:  132.1 kg (291 lb 3.6 oz)    SpO2: 100% 100% 99%  100%     General: Morbidly obese AA male, in no acute distress sitting in recliner c/o being sleepy Head: Normocephalic, atraumatic, sclera non-icteric, mucus membranes are moist Neck: Supple. JVD not elevated. Lungs:  Clear bilaterally to auscultation without wheezes, rales, or rhonchi. Breathing is unlabored. Heart: RRR with S1 S2. No murmurs, rubs, or gallops appreciated. Abdomen: Soft, non-tender, non-distended with normoactive bowel sounds. No rebound/guarding. No obvious abdominal masses. M-S:  Strength and tone appear normal for age. Lower extremities: tr bilateral LE edema no open wounds; skin very dry ashy Neuro: Alert and oriented X 3. Moves all extremities spontaneously. Psych:  Responds to questions appropriately with a normal affect, a little drowsy Dialysis Access: left upper AVF with button holes  Dialysis Orders: MWF @ [facility] 4hr 180 130.5 kgs 2K/2Ca 7000 Heparin L AVF 450/800 profile 4  Hectorol 6 - last Aranesp 8/5    Recent labs:  Hgb 11s 31% sat 9/23 iPTH 360 7/22 AFs declining - 1300>1000>800 - app[facility] for 10/20  Assessment/Plan: 1. Lower GIB -(heme + in ED) CT suspicious for segmental colitis secondary to ischemia; avoid BP drops 2. ESRD -  MWF - has declining AF with fistulagram scheduled at [facility] on 10/20; hold heparin for now 3. Hypertension/volume  - ok - avoid BP drop due to possible ischemic colitis; hx dilated CM - off BB now due to Sept admission for orthostasis 4. Anemia  - Hgb down some - has been off ESAs since August with stable Hgb - repeat Fe levels pre HD Friday;  5. Metabolic bone disease -  Continue Hectorol and fosrenol - on 1 ac- resume when off CL 6. Nutrition - CL - advance as able 7. 3.8 mm lung nodule  RLL rec repeat CT 3-6 months 8. DM - per primary  Sheffield Slider, PA-C [facility] Beeper [phone] 08/22/2014, 12:41 PM

## 2014-08-22 NOTE — Progress Notes (Signed)
PROGRESS NOTE  Nicolas Mccann JJO:[id number] DOB: 1945/12/08 DOA: 08/21/2014 PCP: Lora Paula, MD  Assessment/Plan: Rectal bleeding  Likely hemorrhoidal.   GI consulted  CT Abdomen Pelvis showed: pneumatosis of the right colon, some paracolic bubbles of extraluminal air, and concern for ischemic colitis Low-flow state could worsen ischemic colitis -vanc/zosyn -outpatient colonoscopy  CHF with dilated cardiomyopathy - Patient of Dr. Algie Coffer. Has also seen  Cards.  Patient complains of PND, and worsening cough with clear sputum. CXR does not show CHF.  BNP elevated  Saline lock.  Patient not on beta blocker as metoprolol was discontinued during last admission for orthostatic hypotension.  Will add oxygen and xopenex nebs PRN for comfort. Consider restarting beta blockers.   Recent admission for Orthostatic hypotension  BB and Imdur discontinued in September 2015  monitor orthostatics daily.   Question of Afib on EKG  Documentation indicates heart rate went to 140 with EMS.  No previous history of Afib in documentation.  Reviewed EKG with Cardiology PA who felt it was possibly frequent PACs.  monitor on tele.   Question of PNA  CXR with atelectasis vs infiltrate.  Patient with no elevation in WBC, afebrile.  Blood cultures and levaquin ordered by EDP.  Will hold off on continuing antibiotics at this point.  If he develops symptoms or further clinical evidence of pna would treat for HCAP.  I believe he has some degree of fluid overload either from CHF or ESRD rather than PNA.   ESRD  Finished HD on Wednesday.  Consulted Nephrology for HD on Friday.     DM  SSI - S  Recent HGb A1C 7.1   Code Status: full Family Communication: patient Disposition Plan: SDU for now- possible transfer out tomm   Consultants:  GI  surgery  Procedures:      HPI/Subjective: Up working with PT, mildly tachycardic with exertion  Objective: Filed Vitals:   08/22/14  0749  BP: 102/84  Pulse: 71  Temp: 97.8 F (36.6 C)  Resp: 16    Intake/Output Summary (Last 24 hours) at 08/22/14 0800 Last data filed at 08/22/14 0527  Gross per 24 hour  Intake    606 ml  Output      0 ml  Net    606 ml   Filed Weights   08/21/14 2006 08/22/14 0352  Weight: 134 kg (295 lb 6.7 oz) 132.1 kg (291 lb 3.6 oz)    Exam:   General:  A+Ox3, NAD  Cardiovascular: rrr  Respiratory: clear  Abdomen: +BS, soft  Musculoskeletal: +edema   Data Reviewed: Basic Metabolic Panel:  Recent Labs Lab 08/21/14 1325 08/22/14 0329  NA 144 141  K 3.3* 3.9  CL 100 100  CO2 27 25  GLUCOSE 124* 116*  BUN 16 21  CREATININE 3.76* 4.61*  CALCIUM 9.3 9.5   Liver Function Tests:  Recent Labs Lab 08/21/14 1325  AST 22  ALT 16  ALKPHOS 155*  BILITOT 0.8  PROT 8.3  ALBUMIN 3.3*   No results found for this basename: LIPASE, AMYLASE,  in the last 168 hours No results found for this basename: AMMONIA,  in the last 168 hours CBC:  Recent Labs Lab 08/21/14 1325 08/22/14 0329  WBC 8.0 7.8  HGB 11.0* 10.5*  HCT 33.4* 31.5*  MCV 106.0* 106.1*  PLT 239 222   Cardiac Enzymes: No results found for this basename: CKTOTAL, CKMB, CKMBINDEX, TROPONINI,  in the last 168 hours BNP (last 3 results)  Recent  Labs  03/05/14 1853 08/21/14 1325  PROBNP 7838.0* 13770.0*   CBG:  Recent Labs Lab 08/21/14 1641 08/21/14 2002 08/21/14 2231  GLUCAP 98 155* 116*    Recent Results (from the past 240 hour(s))  CULTURE, BLOOD (ROUTINE X 2)     Status: None   Collection Time    08/21/14  3:11 PM      Result Value Ref Range Status   Specimen Description BLOOD FOREARM RIGHT   Final   Special Requests BOTTLES DRAWN AEROBIC AND ANAEROBIC 5CC   Final   Culture  Setup Time     Final   Value: 08/21/2014 20:54     Performed at [facility]   Culture     Final   Value:        BLOOD CULTURE RECEIVED NO GROWTH TO DATE CULTURE WILL BE HELD FOR 5 DAYS BEFORE ISSUING A  FINAL NEGATIVE REPORT     Performed at [facility]   Report Status PENDING   Incomplete  CULTURE, BLOOD (ROUTINE X 2)     Status: None   Collection Time    08/21/14  3:19 PM      Result Value Ref Range Status   Specimen Description BLOOD HAND RIGHT   Final   Special Requests BOTTLES DRAWN AEROBIC AND ANAEROBIC 5CC   Final   Culture  Setup Time     Final   Value: 08/21/2014 20:53     Performed at [facility]   Culture     Final   Value:        BLOOD CULTURE RECEIVED NO GROWTH TO DATE CULTURE WILL BE HELD FOR 5 DAYS BEFORE ISSUING A FINAL NEGATIVE REPORT     Performed at [facility]   Report Status PENDING   Incomplete  MRSA PCR SCREENING     Status: None   Collection Time    08/21/14  8:22 PM      Result Value Ref Range Status   MRSA by PCR NEGATIVE  NEGATIVE Final   Comment:            The GeneXpert MRSA Assay (FDA     approved for NASAL specimens     only), is one component of a     comprehensive MRSA colonization     surveillance program. It is not     intended to diagnose MRSA     infection nor to guide or     monitor treatment for     MRSA infections.     Studies: Ct Abdomen Pelvis Wo Contrast  08/21/2014   CLINICAL DATA:  Rectal bleeding, diffuse abdominal pain, weakness  EXAM: CT ABDOMEN AND PELVIS WITHOUT CONTRAST  TECHNIQUE: Multidetector CT imaging of the abdomen and pelvis was performed following the standard protocol without IV contrast.  COMPARISON:  None.  FINDINGS: Lung bases shows 8 mm nodule in right lower lobe posteriorly. Follow-up CT scan in 3-6 months is recommended to assure stability.  The study is limited without IV contrast. There is mild perihepatic ascites. No calcified gallstones are noted within gallbladder.  Unenhanced pancreas, spleen and adrenal glands are unremarkable. Unenhanced kidneys are atrophic smaller in size. A cyst in midpole of the right kidney measures 1.1 cm. There is a cyst in lower pole of the left kidney  measures 1.4 cm. Exophytic cyst in lower pole of the left kidney measures 1 cm.  No small bowel obstruction.  There is a small midline supraumbilical hernia containing  omental fat and small amount of fluid without evidence of acute complication measures 2.6 by 2.3 cm.  There is extensive pneumatosis right colonic wall. Axial image 41 there is a linear air air structure extraluminal probable air within colonic vein. Axial image 37 there is small amount of extraluminal air anterior to the colon concerning for small pneumoperitoneum. Findings are highly concerning for segmental colitis probable ischemic starting from the cecum to the level of hepatic flexure . Clinical correlation is necessary.  Oral contrast material was given to the patient. No small bowel obstruction. The terminal ileum is small caliber. No pericecal inflammation. Normal appendix.  No distal colonic obstruction. The urinary bladder is empty limiting its assessment. Prostate gland and seminal vesicles are unremarkable. Bilateral inguinal lymph nodes are noted. Largest right inguinal lymph node measures 2.2 x 1.5 cm. Largest left inguinal lymph node measures 2 by 1.6 cm. Clinical correlation is necessary to exclude adenopathy.  There is a right diaphragmatic lymph node measures 1.3 cm probable pathologic.  Sagittal images of the spine shows degenerative changes lumbar spine.  No hydronephrosis or hydroureter.  IMPRESSION: 1. There is segmental pneumatosis in right colonic wall. Small amount of extraluminal air anterior to the colon concerning for small pneumoperitoneum. There is linear air medial to the colon probable within colonic vein. Findings are highly suspicious for segmental colitis probable ischemic. Clinical correlation is necessary. 2. No small bowel obstruction. 3. 8 mm nodule in right lower lobe posteriorly. Follow-up CT scan in 3-6 months is recommended to assure stability. There is mild enlarged lymph node in right diaphragm/right  cardiophrenic angle. Pathologic adenopathy cannot be excluded. 4. Bilateral atrophic kidneys. No hydronephrosis or hydroureter. Bilateral renal cyst. 5. Bilateral inguinal enlarged lymph nodes. Clinical correlation is necessary to exclude inguinal adenopathy. 6. No distal colonic obstruction. 7. Discussed with Dr. Jonnie Kind   Electronically Signed   By: Natasha Mead M.D.   On: 08/21/2014 18:38   Dg Chest 2 View  08/21/2014   CLINICAL DATA:  Fatigue.  EXAM: CHEST  2 VIEW  COMPARISON:  None.  FINDINGS: Mediastinum and hilar structures are normal. Cardiomegaly. Pulmonary vascularity normal. Mild right basilar atelectasis versus infiltrate. No pleural effusion or pneumothorax. Degenerative changes thoracic spine P  IMPRESSION: 1.  Mild right base subsegmental atelectasis versus infiltrate.  2.  Stable cardiomegaly, no CHF.   Electronically Signed   By: Maisie Fus  Register   On: 08/21/2014 14:51    Scheduled Meds: . atorvastatin  40 mg Oral q1800  . guaiFENesin  600 mg Oral BID  . insulin aspart  0-9 Units Subcutaneous TID WC  . lubiprostone  24 mcg Oral BID WC  . multivitamin  1 tablet Oral QHS  . piperacillin-tazobactam (ZOSYN)  IV  2.25 g Intravenous 3 times per day  . sodium chloride  3 mL Intravenous Q12H  . sodium chloride  3 mL Intravenous Q12H  . [START ON 08/23/2014] vancomycin  1,000 mg Intravenous Q M,W,F-HD   Continuous Infusions:  Antibiotics Given (last 72 hours)   Date/Time Action Medication Dose Rate   08/21/14 2211 Given   piperacillin-tazobactam (ZOSYN) IVPB 2.25 g 2.25 g 100 mL/hr   08/21/14 2225 Given   vancomycin (VANCOCIN) 2,500 mg in sodium chloride 0.9 % 500 mL IVPB 2,500 mg 250 mL/hr   08/22/14 0527 Given   piperacillin-tazobactam (ZOSYN) IVPB 2.25 g 2.25 g 100 mL/hr      Principal Problem:   Rectal bleeding Active Problems:   CKD (chronic kidney disease) stage V  requiring chronic dialysis   Anemia in chronic renal disease   Diabetes mellitus with renal complications    Congestive dilated cardiomyopathy/EF 35-40%    Time spent: 25 min    Margherita Collyer  [facility] Pager [phone]. If 7PM-7AM, please contact night-coverage at [email or web address], password [email or web address] 08/22/2014, 8:00 AM  LOS: 1 day

## 2014-08-22 NOTE — Progress Notes (Signed)
Fortunately, the patient has done very well overnight, despite the somewhat concerning findings in his proximal colon on last night's CT scan. Specifically, he is now pain-free, and he is without further bowel movements through the night or so far this morning. He remains afebrile and with a normal white count. His hemoglobin is essentially stable. On exam, the abdomen is completely nontender, and some sparse bowel sounds are present.  Impression: I suspect ischemic colitis of the proximal colon, clinically resolving  Recommendation:  1. Hold off on colonoscopy for the time being, for fear of potentially disrupting the colon (question of small amount of extraluminal air on CT, suggesting that a transient microperforation may have occurred; the insufflation and scope manipulation associated with colonoscopy could potentially cause a frank perforation at that site).  2. Agree with clear liquid diet as recommended by surgery. Also agree that there is not an indication for surgical intervention at this time.  3. I do feel that eventual colonoscopy would be a good idea. However, this should probably be deferred until he is an outpatient and the colon has had a chance to fully recover, presumably at least several weeks from now.  Florencia Reasons, M.D. [phone]

## 2014-08-22 NOTE — Progress Notes (Addendum)
Subjective: Patient is alert and stable. Denies abdominal pain or nausea. No rectal bleeding overnight.  His history is not consistent with a lower GI bleed. He states he sees right red blood on the toilet paper but has not had bloody bowel movements, has not seen melena, has not passed clots. CT scan reviewed, and I agree that there are some subtle changes of the right colon. He has never had a colonoscopy.  Hemoglobin 10.5. WBC 7800. This is essentially stable and unchanged. Clinically I do not think he is bleeding.  Objective: Vital signs in last 24 hours: Temp:  [97.6 F (36.4 C)-98 F (36.7 C)] 97.8 F (36.6 C) (10/15 0749) Pulse Rate:  [71-107] 71 (10/15 0749) Resp:  [16-29] 16 (10/15 0749) BP: (98-132)/(61-89) 102/84 mmHg (10/15 0749) SpO2:  [90 %-100 %] 100 % (10/15 0749) Weight:  [291 lb 3.6 oz (132.1 kg)-295 lb 6.7 oz (134 kg)] 291 lb 3.6 oz (132.1 kg) (10/15 0352) Last BM Date: 08/21/14  Intake/Output from previous day: 10/14 0701 - 10/15 0700 In: 606 [I.V.:6; IV Piggyback:600] Out: -  Intake/Output this shift:    General appearance: alert. Mental status normal. Comfortable. No distress. Resp: clear to auscultation bilaterally GI: abdomen is soft. Nontender. Not distended. No guarding. No mass. No hernia. No scars. Completely benign  Lab Results:   Recent Labs  08/21/14 1325 08/22/14 0329  WBC 8.0 7.8  HGB 11.0* 10.5*  HCT 33.4* 31.5*  PLT 239 222   BMET  Recent Labs  08/21/14 1325 08/22/14 0329  NA 144 141  K 3.3* 3.9  CL 100 100  CO2 27 25  GLUCOSE 124* 116*  BUN 16 21  CREATININE 3.76* 4.61*  CALCIUM 9.3 9.5   PT/INR  Recent Labs  08/21/14 2105  LABPROT 17.4*  INR 1.41   ABG No results found for this basename: PHART, PCO2, PO2, HCO3,  in the last 72 hours  Studies/Results: Ct Abdomen Pelvis Wo Contrast  08/21/2014   CLINICAL DATA:  Rectal bleeding, diffuse abdominal pain, weakness  EXAM: CT ABDOMEN AND PELVIS WITHOUT CONTRAST   TECHNIQUE: Multidetector CT imaging of the abdomen and pelvis was performed following the standard protocol without IV contrast.  COMPARISON:  None.  FINDINGS: Lung bases shows 8 mm nodule in right lower lobe posteriorly. Follow-up CT scan in 3-6 months is recommended to assure stability.  The study is limited without IV contrast. There is mild perihepatic ascites. No calcified gallstones are noted within gallbladder.  Unenhanced pancreas, spleen and adrenal glands are unremarkable. Unenhanced kidneys are atrophic smaller in size. A cyst in midpole of the right kidney measures 1.1 cm. There is a cyst in lower pole of the left kidney measures 1.4 cm. Exophytic cyst in lower pole of the left kidney measures 1 cm.  No small bowel obstruction.  There is a small midline supraumbilical hernia containing omental fat and small amount of fluid without evidence of acute complication measures 2.6 by 2.3 cm.  There is extensive pneumatosis right colonic wall. Axial image 41 there is a linear air air structure extraluminal probable air within colonic vein. Axial image 37 there is small amount of extraluminal air anterior to the colon concerning for small pneumoperitoneum. Findings are highly concerning for segmental colitis probable ischemic starting from the cecum to the level of hepatic flexure . Clinical correlation is necessary.  Oral contrast material was given to the patient. No small bowel obstruction. The terminal ileum is small caliber. No pericecal inflammation. Normal appendix.  No distal colonic obstruction. The urinary bladder is empty limiting its assessment. Prostate gland and seminal vesicles are unremarkable. Bilateral inguinal lymph nodes are noted. Largest right inguinal lymph node measures 2.2 x 1.5 cm. Largest left inguinal lymph node measures 2 by 1.6 cm. Clinical correlation is necessary to exclude adenopathy.  There is a right diaphragmatic lymph node measures 1.3 cm probable pathologic.  Sagittal images  of the spine shows degenerative changes lumbar spine.  No hydronephrosis or hydroureter.  IMPRESSION: 1. There is segmental pneumatosis in right colonic wall. Small amount of extraluminal air anterior to the colon concerning for small pneumoperitoneum. There is linear air medial to the colon probable within colonic vein. Findings are highly suspicious for segmental colitis probable ischemic. Clinical correlation is necessary. 2. No small bowel obstruction. 3. 8 mm nodule in right lower lobe posteriorly. Follow-up CT scan in 3-6 months is recommended to assure stability. There is mild enlarged lymph node in right diaphragm/right cardiophrenic angle. Pathologic adenopathy cannot be excluded. 4. Bilateral atrophic kidneys. No hydronephrosis or hydroureter. Bilateral renal cyst. 5. Bilateral inguinal enlarged lymph nodes. Clinical correlation is necessary to exclude inguinal adenopathy. 6. No distal colonic obstruction. 7. Discussed with Dr. Jonnie Kind   Electronically Signed   By: Natasha Mead M.D.   On: 08/21/2014 18:38   Dg Chest 2 View  08/21/2014   CLINICAL DATA:  Fatigue.  EXAM: CHEST  2 VIEW  COMPARISON:  None.  FINDINGS: Mediastinum and hilar structures are normal. Cardiomegaly. Pulmonary vascularity normal. Mild right basilar atelectasis versus infiltrate. No pleural effusion or pneumothorax. Degenerative changes thoracic spine P  IMPRESSION: 1.  Mild right base subsegmental atelectasis versus infiltrate.  2.  Stable cardiomegaly, no CHF.   Electronically Signed   By: Maisie Fus  Register   On: 08/21/2014 14:51    Anti-infectives: Anti-infectives   Start     Dose/Rate Route Frequency Ordered Stop   08/23/14 1200  vancomycin (VANCOCIN) IVPB 1000 mg/200 mL premix     1,000 mg 200 mL/hr over 60 Minutes Intravenous Every M-W-F (Hemodialysis) 08/21/14 1905     08/21/14 2200  vancomycin (VANCOCIN) 2,500 mg in sodium chloride 0.9 % 500 mL IVPB     2,500 mg 250 mL/hr over 120 Minutes Intravenous  Once 08/21/14  1905 08/22/14 0025   08/21/14 2000  piperacillin-tazobactam (ZOSYN) IVPB 2.25 g     2.25 g 100 mL/hr over 30 Minutes Intravenous 3 times per day 08/21/14 1901     08/21/14 1500  levofloxacin (LEVAQUIN) IVPB 750 mg     750 mg 100 mL/hr over 90 Minutes Intravenous  Once 08/21/14 1459 08/21/14 1728      Assessment/Plan:  Bright red blood per rectum. Historically this sounds like anal canal and rectal outlet source rather than lower GI bleeding from a more proximal source. No evidence for ongoing bleeding or intra-abdominal inflammatory process There is no indication for surgical intervention Begin clear liquids Colonoscopy is not indicated acutely, but should be considered as an outpatient. Dr. Matthias Hughs following.  Because of the CT scan findings, I think we need to leave ischemic colitis in the differential diagnosis, but I am unsure that this is truly what is going on. In the short term, IV antibiotics would be indicated with this diagnosis.  History of hypotension during dialysis, recurrent. This  suggests some volume depletion and possibly a need for  reassessment of dry weight goals.  Low-flow state could worsen ischemic colitis, if that is what is going on.  ESRD on  HD DM HTN obesity   LOS: 1 day    Brannon Decaire M 08/22/2014

## 2014-08-23 DIAGNOSIS — R197 Diarrhea, unspecified: Secondary | ICD-10-CM

## 2014-08-23 LAB — IRON AND TIBC
IRON: 75 ug/dL (ref 42–135)
SATURATION RATIOS: 36 % (ref 20–55)
TIBC: 207 ug/dL — AB (ref 215–435)
UIBC: 132 ug/dL (ref 125–400)

## 2014-08-23 LAB — BASIC METABOLIC PANEL
Anion gap: 17 — ABNORMAL HIGH (ref 5–15)
BUN: 30 mg/dL — ABNORMAL HIGH (ref 6–23)
CHLORIDE: 94 meq/L — AB (ref 96–112)
CO2: 23 meq/L (ref 19–32)
Calcium: 9.3 mg/dL (ref 8.4–10.5)
Creatinine, Ser: 6.25 mg/dL — ABNORMAL HIGH (ref 0.50–1.35)
GFR calc Af Amer: 10 mL/min — ABNORMAL LOW (ref >90)
GFR calc non Af Amer: 8 mL/min — ABNORMAL LOW (ref >90)
GLUCOSE: 148 mg/dL — AB (ref 70–99)
POTASSIUM: 4.1 meq/L (ref 3.7–5.3)
SODIUM: 134 meq/L — AB (ref 137–147)

## 2014-08-23 LAB — GLUCOSE, CAPILLARY
GLUCOSE-CAPILLARY: 134 mg/dL — AB (ref 70–99)
GLUCOSE-CAPILLARY: 114 mg/dL — AB (ref 70–99)
Glucose-Capillary: 150 mg/dL — ABNORMAL HIGH (ref 70–99)

## 2014-08-23 LAB — CBC
HCT: 31.6 % — ABNORMAL LOW (ref 39.0–52.0)
HEMOGLOBIN: 10.4 g/dL — AB (ref 13.0–17.0)
MCH: 35.9 pg — ABNORMAL HIGH (ref 26.0–34.0)
MCHC: 32.9 g/dL (ref 30.0–36.0)
MCV: 109 fL — ABNORMAL HIGH (ref 78.0–100.0)
Platelets: 226 10*3/uL (ref 150–400)
RBC: 2.9 MIL/uL — AB (ref 4.22–5.81)
RDW: 14.8 % (ref 11.5–15.5)
WBC: 7.2 10*3/uL (ref 4.0–10.5)

## 2014-08-23 LAB — FERRITIN: Ferritin: 618 ng/mL — ABNORMAL HIGH (ref 22–322)

## 2014-08-23 MED ORDER — LOPERAMIDE HCL 2 MG PO CAPS
2.0000 mg | ORAL_CAPSULE | ORAL | Status: DC | PRN
  Filled 2014-08-23: qty 1

## 2014-08-23 MED ORDER — CIPROFLOXACIN HCL 500 MG PO TABS
500.0000 mg | ORAL_TABLET | ORAL | Status: DC
  Administered 2014-08-23: 500 mg via ORAL
  Filled 2014-08-23 (×2): qty 1

## 2014-08-23 MED ORDER — MIDODRINE HCL 5 MG PO TABS
10.0000 mg | ORAL_TABLET | Freq: Two times a day (BID) | ORAL | Status: DC
  Administered 2014-08-23 – 2014-08-24 (×2): 10 mg via ORAL
  Filled 2014-08-23 (×4): qty 2

## 2014-08-23 MED ORDER — DOXERCALCIFEROL 4 MCG/2ML IV SOLN
INTRAVENOUS | Status: AC
  Administered 2014-08-23: 6 ug via INTRAVENOUS
  Filled 2014-08-23: qty 4

## 2014-08-23 MED ORDER — MIDODRINE HCL 5 MG PO TABS
ORAL_TABLET | ORAL | Status: AC
  Administered 2014-08-23: 10 mg via ORAL
  Filled 2014-08-23: qty 2

## 2014-08-23 MED ORDER — METRONIDAZOLE 500 MG PO TABS
500.0000 mg | ORAL_TABLET | Freq: Three times a day (TID) | ORAL | Status: DC
Start: 2014-08-23 — End: 2014-08-24
  Administered 2014-08-23 – 2014-08-24 (×2): 500 mg via ORAL
  Filled 2014-08-23 (×6): qty 1

## 2014-08-23 NOTE — Progress Notes (Signed)
Patient ID: Nicolas Mccann, male   DOB: 1946-05-27, 68 y.o.   MRN: [id number]     [address] [place], [place] [address]    Phone: [phone] FAX: [phone]     Subjective: Pt on commode loose BM, brown, no BRB in stool.  Small amount of BRB is noted on the toilet paper. He is hungry.  Denies abdominal pain.   Objective:  Vital signs:  Filed Vitals:   08/22/14 1702 08/22/14 1951 08/22/14 2105 08/23/14 0423  BP: 101/79 105/74 108/63 101/71  Pulse: 90 100 93 95  Temp: 97.4 F (36.3 C) 97.7 F (36.5 C) 97.5 F (36.4 C) 97.7 F (36.5 C)  TempSrc: Oral Oral Oral Oral  Resp: 13 17 16 18   Height:   6' 2"  (1.88 m)   Weight:   291 lb 3.6 oz (132.1 kg)   SpO2: 100% 100% 98% 100%    Last BM Date: 08/22/14  Intake/Output   Yesterday:  10/15 0701 - 10/16 0700 In: 53 [P.O.:50; I.V.:3] Out: -  This shift: I/O last 3 completed shifts: In: 588 [P.O.:50; I.V.:9; IV Piggyback:600] Out: -     Physical Exam: General: Pt awake/alert/oriented x4 in no acute distress Abdomen: Soft.  Nondistended.  Nontender.  No evidence of peritonitis.  No incarcerated hernias.   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%    Results:   Labs: Results for orders placed during the hospital encounter of 08/21/14 (from the past 48 hour(s))  POC OCCULT BLOOD, ED     Status: Abnormal   Collection Time    08/21/14 12:59 PM      Result Value Ref Range   Fecal Occult Bld POSITIVE (*) NEGATIVE  CBC     Status: Abnormal   Collection Time    08/21/14  1:25 PM      Result Value Ref Range   WBC 8.0  4.0 - 10.5 K/uL   RBC 3.15 (*) 4.22 - 5.81 MIL/uL   Hemoglobin 11.0 (*) 13.0 - 17.0 g/dL   HCT 33.4 (*) 39.0 - 52.0 %    MCV 106.0 (*) 78.0 - 100.0 fL   MCH 34.9 (*) 26.0 - 34.0 pg   MCHC 32.9  30.0 - 36.0 g/dL   RDW 15.2  11.5 - 15.5 %   Platelets 239  150 - 400 K/uL  COMPREHENSIVE METABOLIC PANEL     Status: Abnormal   Collection Time    08/21/14  1:25 PM      Result Value Ref Range   Sodium 144  137 - 147 mEq/L   Potassium 3.3 (*) 3.7 - 5.3 mEq/L   Chloride 100  96 - 112 mEq/L   CO2 27  19 - 32 mEq/L   Glucose, Bld 124 (*) 70 - 99 mg/dL   BUN 16  6 - 23 mg/dL   Creatinine, Ser 3.76 (*) 0.50 - 1.35 mg/dL   Calcium 9.3  8.4 - 10.5 mg/dL   Total Protein 8.3  6.0 - 8.3 g/dL   Albumin 3.3 (*) 3.5 - 5.2 g/dL   AST 22  0 - 37 U/L   ALT 16  0 - 53 U/L   Alkaline Phosphatase 155 (*) 39 -  117 U/L   Total Bilirubin 0.8  0.3 - 1.2 mg/dL   GFR calc non Af Amer 15 (*) >90 mL/min   GFR calc Af Amer 18 (*) >90 mL/min   Comment: (NOTE)     The eGFR has been calculated using the CKD EPI equation.     This calculation has not been validated in all clinical situations.     eGFR's persistently <90 mL/min signify possible Chronic Kidney     Disease.   Anion gap 17 (*) 5 - 15  TYPE AND SCREEN     Status: None   Collection Time    08/21/14  1:25 PM      Result Value Ref Range   ABO/RH(D) A POS     Antibody Screen NEG     Sample Expiration 08/24/2014    PRO B NATRIURETIC PEPTIDE     Status: Abnormal   Collection Time    08/21/14  1:25 PM      Result Value Ref Range   Pro B Natriuretic peptide (BNP) 13770.0 (*) 0 - 125 pg/mL  CULTURE, BLOOD (ROUTINE X 2)     Status: None   Collection Time    08/21/14  3:11 PM      Result Value Ref Range   Specimen Description BLOOD FOREARM RIGHT     Special Requests BOTTLES DRAWN AEROBIC AND ANAEROBIC 5CC     Culture  Setup Time       Value: 08/21/2014 20:54     Performed at [facility]   Culture       Value:        BLOOD CULTURE RECEIVED NO GROWTH TO DATE CULTURE WILL BE HELD FOR 5 DAYS BEFORE ISSUING A FINAL NEGATIVE REPORT     Performed at [facility]   Report Status PENDING    CULTURE, BLOOD (ROUTINE X 2)     Status: None   Collection Time    08/21/14  3:19 PM      Result Value Ref Range   Specimen Description BLOOD HAND RIGHT     Special Requests BOTTLES DRAWN AEROBIC AND ANAEROBIC 5CC     Culture  Setup Time       Value: 08/21/2014 20:53     Performed at [facility]   Culture       Value:        BLOOD CULTURE RECEIVED NO GROWTH TO DATE CULTURE WILL BE HELD FOR 5 DAYS BEFORE ISSUING A FINAL NEGATIVE REPORT     Performed at [facility]   Report Status PENDING    GLUCOSE, CAPILLARY     Status: None   Collection Time    08/21/14  4:41 PM      Result Value Ref Range   Glucose-Capillary 98  70 - 99 mg/dL  GLUCOSE, CAPILLARY     Status: Abnormal   Collection Time    08/21/14  8:02 PM      Result Value Ref Range   Glucose-Capillary 155 (*) 70 - 99 mg/dL  MRSA PCR SCREENING     Status: None   Collection Time    08/21/14  8:22 PM      Result Value Ref Range   MRSA by PCR NEGATIVE  NEGATIVE   Comment:            The GeneXpert MRSA Assay (FDA     approved for NASAL specimens     only), is one component of a  comprehensive MRSA colonization     surveillance program. It is not     intended to diagnose MRSA     infection nor to guide or     monitor treatment for     MRSA infections.  PROTIME-INR     Status: Abnormal   Collection Time    08/21/14  9:05 PM      Result Value Ref Range   Prothrombin Time 17.4 (*) 11.6 - 15.2 seconds   INR 1.41  0.00 - 1.49  APTT     Status: None   Collection Time    08/21/14  9:05 PM      Result Value Ref Range   aPTT 36  24 - 37 seconds  GLUCOSE, CAPILLARY     Status: Abnormal   Collection Time    08/21/14 10:31 PM      Result Value Ref Range   Glucose-Capillary 116 (*) 70 - 99 mg/dL  BASIC METABOLIC PANEL     Status: Abnormal   Collection Time    08/22/14  3:29 AM      Result Value Ref Range   Sodium 141  137 - 147 mEq/L   Potassium 3.9  3.7 - 5.3 mEq/L    Chloride 100  96 - 112 mEq/L   CO2 25  19 - 32 mEq/L   Glucose, Bld 116 (*) 70 - 99 mg/dL   BUN 21  6 - 23 mg/dL   Creatinine, Ser 4.61 (*) 0.50 - 1.35 mg/dL   Calcium 9.5  8.4 - 10.5 mg/dL   GFR calc non Af Amer 12 (*) >90 mL/min   GFR calc Af Amer 14 (*) >90 mL/min   Comment: (NOTE)     The eGFR has been calculated using the CKD EPI equation.     This calculation has not been validated in all clinical situations.     eGFR's persistently <90 mL/min signify possible Chronic Kidney     Disease.   Anion gap 16 (*) 5 - 15  CBC     Status: Abnormal   Collection Time    08/22/14  3:29 AM      Result Value Ref Range   WBC 7.8  4.0 - 10.5 K/uL   RBC 2.97 (*) 4.22 - 5.81 MIL/uL   Hemoglobin 10.5 (*) 13.0 - 17.0 g/dL   HCT 31.5 (*) 39.0 - 52.0 %   MCV 106.1 (*) 78.0 - 100.0 fL   MCH 35.4 (*) 26.0 - 34.0 pg   MCHC 33.3  30.0 - 36.0 g/dL   RDW 15.1  11.5 - 15.5 %   Platelets 222  150 - 400 K/uL  GLUCOSE, CAPILLARY     Status: Abnormal   Collection Time    08/22/14  7:46 AM      Result Value Ref Range   Glucose-Capillary 110 (*) 70 - 99 mg/dL  GLUCOSE, CAPILLARY     Status: Abnormal   Collection Time    08/22/14  1:00 PM      Result Value Ref Range   Glucose-Capillary 131 (*) 70 - 99 mg/dL   Comment 1 Notify RN    GLUCOSE, CAPILLARY     Status: Abnormal   Collection Time    08/22/14  5:01 PM      Result Value Ref Range   Glucose-Capillary 154 (*) 70 - 99 mg/dL  GLUCOSE, CAPILLARY     Status: Abnormal   Collection Time    08/22/14  7:56 PM  Result Value Ref Range   Glucose-Capillary 107 (*) 70 - 99 mg/dL  GLUCOSE, CAPILLARY     Status: None   Collection Time    08/22/14  8:49 PM      Result Value Ref Range   Glucose-Capillary 91  70 - 99 mg/dL    Imaging / Studies: Ct Abdomen Pelvis Wo Contrast  08/21/2014   CLINICAL DATA:  Rectal bleeding, diffuse abdominal pain, weakness  EXAM: CT ABDOMEN AND PELVIS WITHOUT CONTRAST  TECHNIQUE: Multidetector CT imaging of the  abdomen and pelvis was performed following the standard protocol without IV contrast.  COMPARISON:  None.  FINDINGS: Lung bases shows 8 mm nodule in right lower lobe posteriorly. Follow-up CT scan in 3-6 months is recommended to assure stability.  The study is limited without IV contrast. There is mild perihepatic ascites. No calcified gallstones are noted within gallbladder.  Unenhanced pancreas, spleen and adrenal glands are unremarkable. Unenhanced kidneys are atrophic smaller in size. A cyst in midpole of the right kidney measures 1.1 cm. There is a cyst in lower pole of the left kidney measures 1.4 cm. Exophytic cyst in lower pole of the left kidney measures 1 cm.  No small bowel obstruction.  There is a small midline supraumbilical hernia containing omental fat and small amount of fluid without evidence of acute complication measures 2.6 by 2.3 cm.  There is extensive pneumatosis right colonic wall. Axial image 41 there is a linear air air structure extraluminal probable air within colonic vein. Axial image 37 there is small amount of extraluminal air anterior to the colon concerning for small pneumoperitoneum. Findings are highly concerning for segmental colitis probable ischemic starting from the cecum to the level of hepatic flexure . Clinical correlation is necessary.  Oral contrast material was given to the patient. No small bowel obstruction. The terminal ileum is small caliber. No pericecal inflammation. Normal appendix.  No distal colonic obstruction. The urinary bladder is empty limiting its assessment. Prostate gland and seminal vesicles are unremarkable. Bilateral inguinal lymph nodes are noted. Largest right inguinal lymph node measures 2.2 x 1.5 cm. Largest left inguinal lymph node measures 2 by 1.6 cm. Clinical correlation is necessary to exclude adenopathy.  There is a right diaphragmatic lymph node measures 1.3 cm probable pathologic.  Sagittal images of the spine shows degenerative changes  lumbar spine.  No hydronephrosis or hydroureter.  IMPRESSION: 1. There is segmental pneumatosis in right colonic wall. Small amount of extraluminal air anterior to the colon concerning for small pneumoperitoneum. There is linear air medial to the colon probable within colonic vein. Findings are highly suspicious for segmental colitis probable ischemic. Clinical correlation is necessary. 2. No small bowel obstruction. 3. 8 mm nodule in right lower lobe posteriorly. Follow-up CT scan in 3-6 months is recommended to assure stability. There is mild enlarged lymph node in right diaphragm/right cardiophrenic angle. Pathologic adenopathy cannot be excluded. 4. Bilateral atrophic kidneys. No hydronephrosis or hydroureter. Bilateral renal cyst. 5. Bilateral inguinal enlarged lymph nodes. Clinical correlation is necessary to exclude inguinal adenopathy. 6. No distal colonic obstruction. 7. Discussed with Dr. Maryclare Bean   Electronically Signed   By: Lahoma Crocker M.D.   On: 08/21/2014 18:38   Dg Chest 2 View  08/21/2014   CLINICAL DATA:  Fatigue.  EXAM: CHEST  2 VIEW  COMPARISON:  None.  FINDINGS: Mediastinum and hilar structures are normal. Cardiomegaly. Pulmonary vascularity normal. Mild right basilar atelectasis versus infiltrate. No pleural effusion or pneumothorax. Degenerative changes thoracic spine  P  IMPRESSION: 1.  Mild right base subsegmental atelectasis versus infiltrate.  2.  Stable cardiomegaly, no CHF.   Electronically Signed   By: Marcello Moores  Register   On: 08/21/2014 14:51    Medications / Allergies:  Scheduled Meds: . atorvastatin  40 mg Oral q1800  . doxercalciferol  6 mcg Intravenous Q M,W,F-HD  . guaiFENesin  600 mg Oral BID  . insulin aspart  0-9 Units Subcutaneous TID WC  . lubiprostone  24 mcg Oral BID WC  . multivitamin  1 tablet Oral QHS  . piperacillin-tazobactam (ZOSYN)  IV  2.25 g Intravenous 3 times per day  . saccharomyces boulardii  250 mg Oral BID  . vancomycin  1,000 mg Intravenous Q  M,W,F-HD   Continuous Infusions:  PRN Meds:.sodium chloride, sodium chloride, amitriptyline, feeding supplement (NEPRO CARB STEADY), heparin, levalbuterol, lidocaine (PF), lidocaine-prilocaine, ondansetron (ZOFRAN) IV, ondansetron, pentafluoroprop-tetrafluoroeth, sodium chloride  Antibiotics: Anti-infectives   Start     Dose/Rate Route Frequency Ordered Stop   08/23/14 1200  vancomycin (VANCOCIN) IVPB 1000 mg/200 mL premix     1,000 mg 200 mL/hr over 60 Minutes Intravenous Every M-W-F (Hemodialysis) 08/21/14 1905     08/21/14 2200  vancomycin (VANCOCIN) 2,500 mg in sodium chloride 0.9 % 500 mL IVPB     2,500 mg 250 mL/hr over 120 Minutes Intravenous  Once 08/21/14 1905 08/22/14 0025   08/21/14 2000  piperacillin-tazobactam (ZOSYN) IVPB 2.25 g     2.25 g 100 mL/hr over 30 Minutes Intravenous 3 times per day 08/21/14 1901     08/21/14 1500  levofloxacin (LEVAQUIN) IVPB 750 mg     750 mg 100 mL/hr over 90 Minutes Intravenous  Once 08/21/14 1459 08/21/14 1728      Assessment/Plan Bright red blood per rectum Possible ischemic colitis versus anal canal/rectal outlet source Does not appear to be LGIB, I personally saw the blood only on tp, not in stool.   Will advance him to full liquid diet Colonoscopy on outpatient basis per GI History of hypotension during dialysis, recurrent-suggest avoiding volume depletion if at all possible which could cause a low flow state and worsen ischemic colitis.     Erby Pian, [facility] Surgery Pager [phone]) For consults and floor pages call [phone]([id number])  08/23/2014 9:10 AM

## 2014-08-23 NOTE — Progress Notes (Signed)
Subjective:   No current complaints, breathing well- sitting on the toilet- no pain in abdomen  Objective: Vital signs in last 24 hours: Temp:  [97.4 F (36.3 C)-97.8 F (36.6 C)] 97.7 F (36.5 C) (10/16 0423) Pulse Rate:  [71-100] 95 (10/16 0423) Resp:  [13-19] 18 (10/16 0423) BP: (101-116)/(63-84) 101/71 mmHg (10/16 0423) SpO2:  [98 %-100 %] 100 % (10/16 0423) Weight:  [132.1 kg (291 lb 3.6 oz)] 132.1 kg (291 lb 3.6 oz) (10/15 2105) Weight change: -1.9 kg (-4 lb 3 oz)  Intake/Output from previous day: 10/15 0701 - 10/16 0700 In: 53 [P.O.:50; I.V.:3] Out: -  Intake/Output this shift:   Lab Results:  Recent Labs  08/21/14 1325 08/22/14 0329  WBC 8.0 7.8  HGB 11.0* 10.5*  HCT 33.4* 31.5*  PLT 239 222   BMET:  Recent Labs  08/21/14 1325 08/22/14 0329  NA 144 141  K 3.3* 3.9  CL 100 100  CO2 27 25  GLUCOSE 124* 116*  BUN 16 21  CREATININE 3.76* 4.61*  CALCIUM 9.3 9.5  ALBUMIN 3.3*  --    No results found for this basename: PTH,  in the last 72 hours Iron Studies: No results found for this basename: IRON, TIBC, TRANSFERRIN, FERRITIN,  in the last 72 hours  Studies/Results: No results found.  EXAM: General appearance:  Alert, obese, in no apparent distress Resp:  CTA without rales, rhonchi, or wheezes Cardio:  RRR without murmur or rub GI:  + BS, soft and nontender Extremities:  No edema Access:  AVF @ LUA with + bruit  Dialysis Orders: MWF @ [facility] 4hr 180 130.5 kgs 2K/2Ca 7000 Heparin L AVF 450/800 profile 4  Hectorol 6 - last Aranesp 8/5 Recent labs: Hgb 11s 31% sat 9/23 iPTH 360 7/22  AFs declining - 1300>1000>800 - appt at [facility] for 10/20  Assessment/Plan: 1. Lower GIB - heme + in ED; suspected ischemic colitis of proximal colon; colonoscopy delayed sec to question of extraluminal air on CT, suggesting a transient microperforation (per GI); avoid BP drops.  2. ESRD - HD on MWF @ [facility] via AVF; fistulagram scheduled at [facility] 10/20 for declining AFs;  hold heparin for now.  HD pending. 3. HTN/volume - BP 101/71, avoid BP drop due to possible ischemic colitis; Hx dilated CM, off BB now due to Sept admission for orthostasis; wt < 2 L over EDW. Could be a difficult situation for him with chronic low BP- will add midodrine 4. Anemia - Hgb 10.5, off ESAs since August with stable Hgb; repeating Fe levels today. Has been stable 5. Sec HPT - Hectorol and Fosrenol 1 g ac, resume when off CL. 6. Nutrition - CL, advance as able. 7. 3.8 mm lung nodule - @ [facility], recommended repeat CT 3-6 months. 8. DM - per primary     LOS: 2 days   LYLES,CHARLES 08/23/2014,7:39 AM  Patient seen and examined, agree with above note with above modifications. C/O diarrhea no abdominal pain- hgb stable- this will be a difficult issue trying to avoid low BPs.  Will try to add midodrine Annie Sable, MD 08/23/2014

## 2014-08-23 NOTE — Progress Notes (Addendum)
ANTIBIOTIC CONSULT NOTE  Pharmacy Consult for vancomycin and zosyn Indication: pneumatosis in right colonic wall; possible PNA   No Known Allergies  Patient Measurements: Height: 6\' 2"  (188 cm) Weight: 291 lb 3.6 oz (132.1 kg) IBW/kg (Calculated) : 82.2 Adjusted Body Weight: 102 kg  Vital Signs: Temp: 97.7 F (36.5 C) (10/16 0423) Temp Source: Oral (10/16 0423) BP: 101/71 mmHg (10/16 0423) Pulse Rate: 95 (10/16 0423) Intake/Output from previous day: 10/15 0701 - 10/16 0700 In: 53 [P.O.:50; I.V.:3] Out: -  Intake/Output from this shift:    Labs:  Recent Labs  08/21/14 1325 08/22/14 0329 08/23/14 0930  WBC 8.0 7.8 7.2  HGB 11.0* 10.5* 10.4*  PLT 239 222 226  CREATININE 3.76* 4.61* 6.25*   Estimated Creatinine Clearance: 16.4 ml/min (by C-G formula based on Cr of 6.25). No results found for this basename: VANCOTROUGH, VANCOPEAK, VANCORANDOM, GENTTROUGH, GENTPEAK, GENTRANDOM, TOBRATROUGH, TOBRAPEAK, TOBRARND, AMIKACINPEAK, AMIKACINTROU, AMIKACIN,  in the last 72 hours   Microbiology: Recent Results (from the past 720 hour(s))  CULTURE, BLOOD (ROUTINE X 2)     Status: None   Collection Time    08/21/14  3:11 PM      Result Value Ref Range Status   Specimen Description BLOOD FOREARM RIGHT   Final   Special Requests BOTTLES DRAWN AEROBIC AND ANAEROBIC 5CC   Final   Culture  Setup Time     Final   Value: 08/21/2014 20:54     Performed at [facility]   Culture     Final   Value:        BLOOD CULTURE RECEIVED NO GROWTH TO DATE CULTURE WILL BE HELD FOR 5 DAYS BEFORE ISSUING A FINAL NEGATIVE REPORT     Performed at [facility]   Report Status PENDING   Incomplete  CULTURE, BLOOD (ROUTINE X 2)     Status: None   Collection Time    08/21/14  3:19 PM      Result Value Ref Range Status   Specimen Description BLOOD HAND RIGHT   Final   Special Requests BOTTLES DRAWN AEROBIC AND ANAEROBIC 5CC   Final   Culture  Setup Time     Final   Value: 08/21/2014  20:53     Performed at [facility]   Culture     Final   Value:        BLOOD CULTURE RECEIVED NO GROWTH TO DATE CULTURE WILL BE HELD FOR 5 DAYS BEFORE ISSUING A FINAL NEGATIVE REPORT     Performed at [facility]   Report Status PENDING   Incomplete  MRSA PCR SCREENING     Status: None   Collection Time    08/21/14  8:22 PM      Result Value Ref Range Status   MRSA by PCR NEGATIVE  NEGATIVE Final   Comment:            The GeneXpert MRSA Assay (FDA     approved for NASAL specimens     only), is one component of a     comprehensive MRSA colonization     surveillance program. It is not     intended to diagnose MRSA     infection nor to guide or     monitor treatment for     MRSA infections.   Assessment: 68 yo man with pneumatosis in right colonic wall.  Findings are highly concerning for segmental colitis probable ischemic starting from the cecum to the level  of hepatic flexure. Clinically improving, will hold off on colonoscopy right now per GI (wait several weeks and so as outpt) ESRD on HD MWF- plans to go today. WBC 7.2, Afebrile.  Levaquin 750 x 1 10/14 vanc 10/14>> Zosyn 10/14>>  10/14 BCx2: NGTD  Goal of Therapy:  Pre-HD vancomycin level 15-25 mcg/ml  Plan:  1. zosyn 2.25 gm IV q8h 2. vancomycin 1000 mg after each HD on MWF 3. f/u wbc, temp, clinical course 4. pre-HD vanc level as needed  Lauren D. Bajbus, PharmD, BCPS Clinical Pharmacist Pager: [phone] 08/23/2014 10:41 AM   Addendum: GI now recommending to change to PO antibiotics. TRH discontinued Vancomycin/Zosyn, started Flagyl 500mg  PO q8h and consulted pharmacy to dose Ciprofloxacin PO for suspected colitis. - ESRD on HD MWF  Plan: 1. Ciprofloxacin 500mg  PO q24h 2. Continue Flagyl 500mg  PO q8h - dosing appropriate 3. Monitor renal plans, clinical progress and adjust as indicated  Wilfred Lacy, PharmD Clinical Pharmacist [phone] 08/23/2014, 5:29 PM

## 2014-08-23 NOTE — Progress Notes (Signed)
Patient currently on dialysis.  Remains afebrile, pain-free, and hemoglobin is stable.  He still reports diarrhea after each meal. He is still on a liquid diet.  On exam, the abdomen is very obese, but soft and without tenderness.  Impression: Clinically resolving colitis process of the proximal colon, suspect that it was ischemic colitis  Recommendation:  1. I feel that it is okay for him to advance to solid food, so I have ordered a renal diet for him 2. I think that discharge, from the GI tract standpoint, is okay at anytime. 3. it is not clear whether, or for how long, the patient needs antibiotic therapy. Given his benign exam and clinical course, conversion to oral antibiotics and completion of a one-week course of oral therapy might be reasonable. I would recommend continuing FloraStor probiotic therapy for the duration of antibiotics and probably a week or two thereafter, to help reduce the risk of C. difficile colitis. 4. I have made the patient a followup appointment with me for November 24 at 4:15 PM. At that time, he and I will discuss the option of doing colonoscopy, both for screening purposes and to confirm the absence of any ongoing pathologic process in the proximal colon (doubt). 5.  I will try the patient on low-dose Imodium for his postprandial diarrhea, trying to make sure that he does not develop any "overshoot" constipation. 6. I will sign off at this time. Please call me if I can be of further assistance in this patient's care, or if he has any GI setbacks.  Florencia Reasons, M.D. [phone]

## 2014-08-23 NOTE — Progress Notes (Addendum)
Chart reviewed.   PROGRESS NOTE  Nicolas Mccann WUJ:[id number] DOB: [birth year] DOA: 08/21/2014 PCP: Lora Paula, MD  Assessment/Plan: Rectal bleeding with probable ischemic colitis: Advance diet to solids. Change to po abx per GI recs. Avoid hypotension  Diarrhea: Check stool for c diff. D/c amitiza  CHF with dilated cardiomyopathy - Patient of Dr. Algie Coffer. Has also seen [facility].  Getting HD now  Recent admission for Orthostatic hypotension  BB and Imdur discontinued in September 2015  monitor orthostatics daily.   Question of Afib on EKG  Likely pvcs. No a fib on tele  Question of PNA  Likely pulmonary edema. Doubt pneumonia clinically. No cough, fever, wheeze, etc  ESRD  On HD now  DM  SSI - S  Recent HGb A1C 7.1   Code Status: full Family Communication: patient Disposition Plan: home 1-2 days if stable   Consultants:  GI  Surgery  nephrology  Procedures: HD  HPI/Subjective: C/o frequent loose stools. No abd pain, n/v. No bleeding  Objective: Filed Vitals:   08/23/14 1630  BP: 105/75  Pulse: 83  Temp:   Resp:     Intake/Output Summary (Last 24 hours) at 08/23/14 1638 Last data filed at 08/23/14 1348  Gross per 24 hour  Intake    720 ml  Output      0 ml  Net    720 ml   Filed Weights   08/21/14 2006 08/22/14 0352 08/22/14 2105  Weight: 134 kg (295 lb 6.7 oz) 132.1 kg (291 lb 3.6 oz) 132.1 kg (291 lb 3.6 oz)    Exam:   General:  A+Ox3, NAD on HD  Cardiovascular: rrr without MGR  Respiratory: clear without WRR  Abdomen: +BS, soft, nT, obese  Musculoskeletal: +edema   Data Reviewed: Basic Metabolic Panel:  Recent Labs Lab 08/21/14 1325 08/22/14 0329 08/23/14 0930  NA 144 141 134*  K 3.3* 3.9 4.1  CL 100 100 94*  CO2 27 25 23   GLUCOSE 124* 116* 148*  BUN 16 21 30*  CREATININE 3.76* 4.61* 6.25*  CALCIUM 9.3 9.5 9.3   Liver Function Tests:  Recent Labs Lab 08/21/14 1325  AST 22  ALT 16  ALKPHOS 155*    BILITOT 0.8  PROT 8.3  ALBUMIN 3.3*   No results found for this basename: LIPASE, AMYLASE,  in the last 168 hours No results found for this basename: AMMONIA,  in the last 168 hours CBC:  Recent Labs Lab 08/21/14 1325 08/22/14 0329 08/23/14 0930  WBC 8.0 7.8 7.2  HGB 11.0* 10.5* 10.4*  HCT 33.4* 31.5* 31.6*  MCV 106.0* 106.1* 109.0*  PLT 239 222 226   Cardiac Enzymes: No results found for this basename: CKTOTAL, CKMB, CKMBINDEX, TROPONINI,  in the last 168 hours BNP (last 3 results)  Recent Labs  03/05/14 1853 08/21/14 1325  PROBNP 7838.0* 13770.0*   CBG:  Recent Labs Lab 08/22/14 1701 08/22/14 1956 08/22/14 2049 08/23/14 0908 08/23/14 1219  GLUCAP 154* 107* 91 150* 134*    Recent Results (from the past 240 hour(s))  CULTURE, BLOOD (ROUTINE X 2)     Status: None   Collection Time    08/21/14  3:11 PM      Result Value Ref Range Status   Specimen Description BLOOD FOREARM RIGHT   Final   Special Requests BOTTLES DRAWN AEROBIC AND ANAEROBIC 5CC   Final   Culture  Setup Time     Final   Value: 08/21/2014 20:54  Performed at [facility]     Final   Value:        BLOOD CULTURE RECEIVED NO GROWTH TO DATE CULTURE WILL BE HELD FOR 5 DAYS BEFORE ISSUING A FINAL NEGATIVE REPORT     Performed at [facility]   Report Status PENDING   Incomplete  CULTURE, BLOOD (ROUTINE X 2)     Status: None   Collection Time    08/21/14  3:19 PM      Result Value Ref Range Status   Specimen Description BLOOD HAND RIGHT   Final   Special Requests BOTTLES DRAWN AEROBIC AND ANAEROBIC 5CC   Final   Culture  Setup Time     Final   Value: 08/21/2014 20:53     Performed at [facility]   Culture     Final   Value:        BLOOD CULTURE RECEIVED NO GROWTH TO DATE CULTURE WILL BE HELD FOR 5 DAYS BEFORE ISSUING A FINAL NEGATIVE REPORT     Performed at [facility]   Report Status PENDING   Incomplete  MRSA PCR SCREENING     Status: None    Collection Time    08/21/14  8:22 PM      Result Value Ref Range Status   MRSA by PCR NEGATIVE  NEGATIVE Final   Comment:            The GeneXpert MRSA Assay (FDA     approved for NASAL specimens     only), is one component of a     comprehensive MRSA colonization     surveillance program. It is not     intended to diagnose MRSA     infection nor to guide or     monitor treatment for     MRSA infections.     Studies: Ct Abdomen Pelvis Wo Contrast  08/21/2014   CLINICAL DATA:  Rectal bleeding, diffuse abdominal pain, weakness  EXAM: CT ABDOMEN AND PELVIS WITHOUT CONTRAST  TECHNIQUE: Multidetector CT imaging of the abdomen and pelvis was performed following the standard protocol without IV contrast.  COMPARISON:  None.  FINDINGS: Lung bases shows 8 mm nodule in right lower lobe posteriorly. Follow-up CT scan in 3-6 months is recommended to assure stability.  The study is limited without IV contrast. There is mild perihepatic ascites. No calcified gallstones are noted within gallbladder.  Unenhanced pancreas, spleen and adrenal glands are unremarkable. Unenhanced kidneys are atrophic smaller in size. A cyst in midpole of the right kidney measures 1.1 cm. There is a cyst in lower pole of the left kidney measures 1.4 cm. Exophytic cyst in lower pole of the left kidney measures 1 cm.  No small bowel obstruction.  There is a small midline supraumbilical hernia containing omental fat and small amount of fluid without evidence of acute complication measures 2.6 by 2.3 cm.  There is extensive pneumatosis right colonic wall. Axial image 41 there is a linear air air structure extraluminal probable air within colonic vein. Axial image 37 there is small amount of extraluminal air anterior to the colon concerning for small pneumoperitoneum. Findings are highly concerning for segmental colitis probable ischemic starting from the cecum to the level of hepatic flexure . Clinical correlation is necessary.  Oral  contrast material was given to the patient. No small bowel obstruction. The terminal ileum is small caliber. No pericecal inflammation. Normal appendix.  No distal colonic obstruction. The urinary  bladder is empty limiting its assessment. Prostate gland and seminal vesicles are unremarkable. Bilateral inguinal lymph nodes are noted. Largest right inguinal lymph node measures 2.2 x 1.5 cm. Largest left inguinal lymph node measures 2 by 1.6 cm. Clinical correlation is necessary to exclude adenopathy.  There is a right diaphragmatic lymph node measures 1.3 cm probable pathologic.  Sagittal images of the spine shows degenerative changes lumbar spine.  No hydronephrosis or hydroureter.  IMPRESSION: 1. There is segmental pneumatosis in right colonic wall. Small amount of extraluminal air anterior to the colon concerning for small pneumoperitoneum. There is linear air medial to the colon probable within colonic vein. Findings are highly suspicious for segmental colitis probable ischemic. Clinical correlation is necessary. 2. No small bowel obstruction. 3. 8 mm nodule in right lower lobe posteriorly. Follow-up CT scan in 3-6 months is recommended to assure stability. There is mild enlarged lymph node in right diaphragm/right cardiophrenic angle. Pathologic adenopathy cannot be excluded. 4. Bilateral atrophic kidneys. No hydronephrosis or hydroureter. Bilateral renal cyst. 5. Bilateral inguinal enlarged lymph nodes. Clinical correlation is necessary to exclude inguinal adenopathy. 6. No distal colonic obstruction. 7. Discussed with Dr. Jonnie KindAlmahi   Electronically Signed   By: Natasha MeadLiviu  Pop M.D.   On: 08/21/2014 18:38    Scheduled Meds: . atorvastatin  40 mg Oral q1800  . doxercalciferol  6 mcg Intravenous Q M,W,F-HD  . guaiFENesin  600 mg Oral BID  . insulin aspart  0-9 Units Subcutaneous TID WC  . lubiprostone  24 mcg Oral BID WC  . metroNIDAZOLE  500 mg Oral 3 times per day  . midodrine      . midodrine  10 mg Oral BID  WC  . multivitamin  1 tablet Oral QHS  . saccharomyces boulardii  250 mg Oral BID   Continuous Infusions:  Antibiotics Given (last 72 hours)   Date/Time Action Medication Dose Rate   08/21/14 2211 Given   piperacillin-tazobactam (ZOSYN) IVPB 2.25 g 2.25 g 100 mL/hr   08/21/14 2225 Given   vancomycin (VANCOCIN) 2,500 mg in sodium chloride 0.9 % 500 mL IVPB 2,500 mg 250 mL/hr   08/22/14 0527 Given   piperacillin-tazobactam (ZOSYN) IVPB 2.25 g 2.25 g 100 mL/hr   08/22/14 1313 Given   piperacillin-tazobactam (ZOSYN) IVPB 2.25 g 2.25 g 100 mL/hr   08/22/14 2226 Given   piperacillin-tazobactam (ZOSYN) IVPB 2.25 g 2.25 g 100 mL/hr   08/23/14 0547 Given   piperacillin-tazobactam (ZOSYN) IVPB 2.25 g 2.25 g 100 mL/hr   08/23/14 1425 Given   piperacillin-tazobactam (ZOSYN) IVPB 2.25 g 2.25 g 100 mL/hr     Time spent: 35 min  Catheryne Deford L  [facility] Pager [phone]. If 7PM-7AM, please contact night-coverage at [email or web address], password [email or web address] 08/23/2014, 4:38 PM  LOS: 2 days

## 2014-08-23 NOTE — Progress Notes (Signed)
General surgery attending:  Patient interviewed and examined. Agree with above  Tolerating liquid diet and states he has loose stool after each meal States his bowel movements are dark. Denies pain Hemoglobin 10.5. Completely stable Abdomen soft and benign and nontender on exam  We'll continue to follow but there is no indication for surgical intervention.  Nicolas Mccann Mention

## 2014-08-23 NOTE — Progress Notes (Signed)
PT Cancellation Note  Patient Details Name: Nicolas Mccann MRN: [id number] DOB: 1946-07-15   Cancelled Treatment:    Reason Eval/Treat Not Completed: Patient at procedure or test/unavailable;Other (comment) (in HD)   Ferman Hamming 08/23/2014, 3:10 PM Weldon Picking PT Acute Rehab Services [phone] Beeper [phone]

## 2014-08-23 NOTE — Care Management Note (Signed)
CARE MANAGEMENT NOTE 08/23/2014  Patient:  Nicolas Mccann, Nicolas Mccann   Account Number:  [id number]  Date Initiated:  08/23/2014  Documentation initiated by:  Kowen Kluth  Subjective/Objective Assessment:   CM following for progression and d/c planning.     Action/Plan:   08/23/2014.  Met with pt no d/c needs identified at this time, will continue to follow.   Anticipated DC Date:     Anticipated DC Plan:  HOME/SELF CARE         Choice offered to / List presented to:             Status of service:   Medicare Important Message given?  YES (If response is "NO", the following Medicare IM given date fields will be blank) Date Medicare IM given:  08/23/2014 Medicare IM given by:  Darcel Zick Date Additional Medicare IM given:   Additional Medicare IM given by:    Discharge Disposition:    Per UR Regulation:    If discussed at Long Length of Stay Meetings, dates discussed:    Comments:

## 2014-08-23 NOTE — Procedures (Signed)
Patient was seen on dialysis and the procedure was supervised.  BFR 350  Via AVF BP is  99/68.   Patient appears to be tolerating treatment well  Nicolas Mccann A 08/23/2014

## 2014-08-24 LAB — GLUCOSE, CAPILLARY
Glucose-Capillary: 108 mg/dL — ABNORMAL HIGH (ref 70–99)
Glucose-Capillary: 119 mg/dL — ABNORMAL HIGH (ref 70–99)

## 2014-08-24 MED ORDER — SACCHAROMYCES BOULARDII 250 MG PO CAPS: 250.0000 mg | ORAL_CAPSULE | Freq: Two times a day (BID) | ORAL | Status: AC

## 2014-08-24 MED ORDER — MIDODRINE HCL 10 MG PO TABS: 10.0000 mg | ORAL_TABLET | Freq: Two times a day (BID) | ORAL | Status: AC

## 2014-08-24 MED ORDER — CIPROFLOXACIN HCL 500 MG PO TABS: 500.0000 mg | ORAL_TABLET | ORAL | Status: DC

## 2014-08-24 MED ORDER — METRONIDAZOLE 500 MG PO TABS: 500.0000 mg | ORAL_TABLET | Freq: Three times a day (TID) | ORAL | Status: DC

## 2014-08-24 NOTE — Care Management Note (Signed)
    Page 1 of 2   08/24/2014     10:50:13 AM CARE MANAGEMENT NOTE 08/24/2014  Patient:  MORTIMER, BAIR   Account Number:  [id number]  Date Initiated:  08/23/2014  Documentation initiated by:  Jasmine Pang  Subjective/Objective Assessment:   CM following for progression and d/c planning.     Action/Plan:   08/23/2014.  Met with pt no d/c needs identified at this time, will continue to follow.   Anticipated DC Date:  08/24/2014   Anticipated DC Plan:  [place]  CM consult      [facility] Choice  HOME HEALTH   Choice offered to / List presented to:  C-1 Patient   DME arranged  [facility]      DME agency  [place] arranged  [place].   Status of service:  Completed, signed off Medicare Important Message given?  YES (If response is "NO", the following Medicare IM given date fields will be blank) Date Medicare IM given:  08/23/2014 Medicare IM given by:  ROYAL,CHERYL Date Additional Medicare IM given:   Additional Medicare IM given by:    Discharge Disposition:  [place]  Per UR Regulation:    If discussed at Long Length of Stay Meetings, dates discussed:    Comments:  08/24/14 10:45 CM met with pt in room to offer choice of home health agency.  Pt chooses [facility] to render HHPT. Address and contact information verified with pt.  Orders and F2F are in EPIC.  DME rep notified to bring cane to room prior to discharge.  Referral called to [facility] rep, Miranda.  No other CM needs were communicated. Mariane Masters, BSN, [place].

## 2014-08-24 NOTE — Progress Notes (Signed)
  Subjective: He reports some blood still in tissue and with BM in toilet.  No pain, no abdominal discomfort, and tolerating diet well.  He knows his BP is low at the end of HD, but it doesn't sound like he can really tell when his BP is down.  Objective: Vital signs in last 24 hours: Temp:  [97.8 F (36.6 C)-98.4 F (36.9 C)] 97.8 F (36.6 C) (10/17 0533) Pulse Rate:  [78-106] 90 (10/17 0533) Resp:  [16-18] 16 (10/17 0533) BP: (87-124)/(58-88) 105/67 mmHg (10/17 0533) SpO2:  [95 %-100 %] 100 % (10/17 0533) Weight:  [129.502 kg (285 lb 8 oz)] 129.502 kg (285 lb 8 oz) (10/16 2058) Last BM Date: 08/23/14 720 PO 4 Bm's recorded Renal/carb modified diet Afebrile, VSS Labs stable yesterday CT on 08/21/14 Intake/Output from previous day: 10/16 0701 - 10/17 0700 In: 720 [P.O.:720] Out: -  Intake/Output this shift:    General appearance: alert, cooperative and no distress GI: soft, non-tender; bowel sounds normal; no masses,  no organomegaly  Lab Results:   Recent Labs  08/22/14 0329 08/23/14 0930  WBC 7.8 7.2  HGB 10.5* 10.4*  HCT 31.5* 31.6*  PLT 222 226    BMET  Recent Labs  08/22/14 0329 08/23/14 0930  NA 141 134*  K 3.9 4.1  CL 100 94*  CO2 25 23  GLUCOSE 116* 148*  BUN 21 30*  CREATININE 4.61* 6.25*  CALCIUM 9.5 9.3   PT/INR  Recent Labs  08/21/14 2105  LABPROT 17.4*  INR 1.41     Recent Labs Lab 08/21/14 1325  AST 22  ALT 16  ALKPHOS 155*  BILITOT 0.8  PROT 8.3  ALBUMIN 3.3*     Lipase  No results found for this basename: lipase     Studies/Results: No results found.  Medications: . atorvastatin  40 mg Oral q1800  . ciprofloxacin  500 mg Oral Q24H  . doxercalciferol  6 mcg Intravenous Q M,W,F-HD  . guaiFENesin  600 mg Oral BID  . insulin aspart  0-9 Units Subcutaneous TID WC  . metroNIDAZOLE  500 mg Oral 3 times per day  . midodrine  10 mg Oral BID WC  . multivitamin  1 tablet Oral QHS  . saccharomyces boulardii  250 mg  Oral BID    Assessment/Plan Bright red blood per rectum  Possible ischemic colitis versus anal canal/rectal outlet source  Colonoscopy on outpatient basis per GI  History of hypotension during dialysis, recurrent-suggest avoiding volume depletion if at all possible which could cause a low flow state and worsen ischemic colitis.  SCD for DVT, no anticoagulants  Plan:  Currently exam remains quite normal.  He has no pain or discomfort, tolerating diet well, hemodynamically stable.  If he remains stable and H/H remain stable there is nothing surgically to add.      LOS: 3 days    Nimesh Riolo 08/24/2014

## 2014-08-24 NOTE — Progress Notes (Signed)
Subjective:  Noted bright red blood on tissue with BM yesterday, no pain, no constipation- had HD yest- no extreme drop in BP- says he may go home today ?  Objective: Vital signs in last 24 hours: Temp:  [97.8 F (36.6 C)-98.4 F (36.9 C)] 97.8 F (36.6 C) (10/17 0533) Pulse Rate:  [78-106] 90 (10/17 0533) Resp:  [16-18] 16 (10/17 0533) BP: (87-124)/(58-88) 105/67 mmHg (10/17 0533) SpO2:  [95 %-100 %] 100 % (10/17 0533) Weight:  [129.502 kg (285 lb 8 oz)] 129.502 kg (285 lb 8 oz) (10/16 2058) Weight change: -2.598 kg (-5 lb 11.6 oz)  Intake/Output from previous day: 10/16 0701 - 10/17 0700 In: 720 [P.O.:720] Out: -  Intake/Output this shift:   Lab Results:  Recent Labs  08/22/14 0329 08/23/14 0930  WBC 7.8 7.2  HGB 10.5* 10.4*  HCT 31.5* 31.6*  PLT 222 226   BMET:  Recent Labs  08/21/14 1325 08/22/14 0329 08/23/14 0930  NA 144 141 134*  K 3.3* 3.9 4.1  CL 100 100 94*  CO2 27 25 23   GLUCOSE 124* 116* 148*  BUN 16 21 30*  CREATININE 3.76* 4.61* 6.25*  CALCIUM 9.3 9.5 9.3  ALBUMIN 3.3*  --   --    No results found for this basename: PTH,  in the last 72 hours Iron Studies:  Recent Labs  08/23/14 1500  IRON 75  TIBC 207*  FERRITIN 618*   Studies/Results: No results found.  EXAM:  General appearance: Alert, obese, in no apparent distress  Resp: CTA without rales, rhonchi, or wheezes  Cardio: RRR without murmur or rub  GI: + BS, soft and nontender  Extremities: No edema  Access: AVF @ LUA with + bruit   Dialysis Orders: MWF @ [facility] 4hr 180 130.5 kgs 2K/2Ca 7000 Heparin L AVF 450/800 profile 4  Hectorol 6 - last Aranesp 8/5 Recent labs: Hgb 11s 31% sat 9/23 iPTH 360 7/22  AFs declining - 1300>1000>800 - appt at [facility] for 10/20  Assessment/Plan: 1. Lower GIB - heme + in ED; suspected ischemic colitis of proximal colon; colonoscopy delayed sec to question of extraluminal air on CT, suggesting a transient microperforation (per GI); avoid BP drops.   2. ESRD - HD on MWF @ [facility] via AVF; fistulagram scheduled at [facility] 10/20 for declining AFs; hold Hparin for now.  3. HTN/volume - BP 105/67, avoid BP drop due to possible ischemic colitis; Hx dilated CM, off BB now due to Sept admission for orthostasis; wt 129.5 kg, below EDW. Have started midodrine this admit 10 BID 4. Anemia - Hgb 10.4, Fe sat 36%, off ESAs since August with stable Hgb.  5. Sec HPT - Ca 9.3; Hectorol and Fosrenol 1 g ac, resume when off CL.  6. Nutrition - CL, advance as able.  7. 3.8 mm lung nodule - @ [facility], recommended repeat CT 3-6 months.  8. DM - per primary   LOS: 3 days   LYLES,CHARLES 08/24/2014,8:16 AM  Patient seen and examined, agree with above note with above modifications. BRBPR thought due to ischemic colitis- treatine conservatively without surgery- have started midodrine to avoid big drop with HD- tolerated HD yest but not sure how much taken off ? Says he may go home today ? Annie Sable, MD 08/24/2014

## 2014-08-24 NOTE — Discharge Summary (Addendum)
Physician Discharge Summary  Nicolas Mccann MBW:[id number] DOB: Aug 21, 1946 DOA: 08/21/2014  PCP: Lora Paula, MD  Admit date: 08/21/2014 Discharge date: 08/24/2014  Time spent: greater than 30 minutes  Recommendations for Outpatient Follow-up:  1. 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 male, history significant for end-stage renal disease (on dialysis Monday Wednesday Friday), A. fib, CHF (EF 35-40% in April of 2015). He had a recent admission for orthostatic hypotension (discharged September 18). He presents to the ER after hemodialysis today with rectal bleeding. He complains of brown stool with bright red blood that he has been experiencing for the past 4 days. He says he has had be regular bowel movements with each meal after taking Fosrenol and denies any constipation or diarrhea. He denies melena. His appetite is good. He has never had a colonoscopy. He also complains of worsening shortness of breath, productive cough (with white sputum) and PND.  In the emergency department he appears stable. Hemoglobin is 11.0, weight count not elevated, afebrile, x-ray shows atelectasis versus infiltrate. The ED physician has ordered a CT abdomen pelvis, consulted [facility], and ordered blood cultures. He will be admitted to telemetry for rectal bleeding.   Hospital Course:  Rectal bleeding  CT Abdomen Pelvis showed: pneumatosis of the right colon, some paracolic bubbles of extraluminal air,  and concern for ischemic colitis  GI and Surgery consulted. Patient had no abdominal pain or tenderness and was hemodynamically stable. Antibiotics and observation recommended. Patient has remained stable with resolution of bleeding. Did not require transfusion. Will have colonoscopy as outpatient. Complete 6 more days antibiotics  CHF with dilated cardiomyopathy - Patient of Dr. Algie Coffer. Has also seen [facility].  Patient complains of PND, and worsening cough with clear sputum. CXR does not show CHF.   Recent admission for Orthostatic hypotension  BB and Imdur discontinued in September 2015   ESRD  Nephrology consulted for hemodialysis  DM  SSI - S  Recent HGb A1C 7.1   Consultants:  GI  Surgery nephrology Procedures:  hemodilaysis.  Discharge Exam: Filed Vitals:   08/24/14 0533  BP: 105/67  Pulse: 90  Temp: 97.8 F (36.6 C)  Resp: 16    General: alert, oriented Cardiovascular: RRR Respiratory: CTA Abd: s, nt, nd Ext no CCE  Discharge Instructions   Discharge instructions    Complete by:  As directed   RENAL CARBOHYDRATE MODIFIED DIET     Increase activity slowly    Complete by:  As directed           Current Discharge Medication List    START taking these medications   Details  ciprofloxacin (CIPRO) 500 MG tablet Take 1 tablet (500 mg total) by mouth daily. Qty: 6 tablet, Refills: 0    metroNIDAZOLE (FLAGYL) 500 MG tablet Take 1 tablet (500 mg total) by mouth every 8 (eight) hours. Qty: 18 tablet, Refills: 0    midodrine (PROAMATINE) 10 MG tablet Take 1 tablet (10 mg  total) by mouth 2 (two) times daily with a meal. Qty: 60 tablet, Refills: 0    saccharomyces boulardii (FLORASTOR) 250 MG capsule Take 1 capsule (250 mg total) by mouth 2 (two) times daily. Qty: 12 capsule, Refills: 0      CONTINUE these medications which have NOT CHANGED   Details  allopurinol (ZYLOPRIM) 100 MG tablet Take 100 mg by mouth daily.    amitriptyline (ELAVIL) 25 MG  tablet Take 25 mg by mouth at bedtime as needed for sleep.     atorvastatin (LIPITOR) 40 MG tablet Take 1 tablet (40 mg total) by mouth daily at 6 PM. Qty: 30 tablet, Refills: 2    b complex-vitamin c-folic acid (NEPHRO-VITE) 0.8 MG TABS tablet Take 0.8 mg by mouth daily.     lanthanum (FOSRENOL) 1000 MG chewable tablet Chew 1,000 mg by mouth 2 (two) times daily with a meal.    linagliptin (TRADJENTA) 5 MG TABS tablet Take 1 tablet (5 mg total) by mouth daily. Qty: 30 tablet, Refills: 5   Associated Diagnoses: Type 2 diabetes mellitus with diabetic chronic kidney disease    Nutritional Supplements (FEEDING SUPPLEMENT, NEPRO CARB STEADY,) LIQD Take 237 mLs by mouth as needed (missed meal during dialysis.). Qty: 6 Can, Refills: prn      STOP taking these medications     metoprolol tartrate (LOPRESSOR) 25 MG tablet        No Known Allergies Follow-up Information   Follow up with Florencia Reasons, MD On 10/01/2014. (AT 4:15 PM)    Specialty:  Gastroenterology   Contact information:   [address], [address] [place] [address] [phone]       Follow up with Lora Paula, MD In 1 week.   Specialty:  Family Medicine   Contact information:   [address] [place] [address] [phone]        The results of significant diagnostics from this hospitalization (including imaging, microbiology, ancillary and laboratory) are listed below for reference.    Significant Diagnostic Studies: Ct Abdomen Pelvis Wo Contrast  08/21/2014   CLINICAL DATA:  Rectal bleeding, diffuse abdominal pain, weakness  EXAM: CT ABDOMEN AND PELVIS WITHOUT CONTRAST  TECHNIQUE: Multidetector CT imaging of the abdomen and pelvis was performed following the standard protocol without IV contrast.  COMPARISON:  None.  FINDINGS: Lung bases shows 8 mm nodule in right lower lobe posteriorly. Follow-up CT scan in 3-6 months is recommended to assure stability.  The study is limited  without IV contrast. There is mild perihepatic ascites. No calcified gallstones are noted within gallbladder.  Unenhanced pancreas, spleen and adrenal glands are unremarkable. Unenhanced kidneys are atrophic smaller in size. A cyst in midpole of the right kidney measures 1.1 cm. There is a cyst in lower pole of the left kidney measures 1.4 cm. Exophytic cyst in lower pole of the left kidney measures 1 cm.  No small bowel obstruction.  There is a small midline supraumbilical hernia containing omental fat and small amount of fluid without evidence of acute complication measures 2.6 by 2.3 cm.  There is extensive pneumatosis right colonic wall. Axial image 41 there is a linear air air structure extraluminal probable air within colonic vein. Axial image 37 there is small amount of extraluminal air anterior to the colon concerning for small pneumoperitoneum. Findings are highly concerning for segmental colitis probable ischemic starting from the cecum to the level of hepatic flexure . Clinical correlation is necessary.  Oral contrast material was  given to the patient. No small bowel obstruction. The terminal ileum is small caliber. No pericecal inflammation. Normal appendix.  No distal colonic obstruction. The urinary bladder is empty limiting its assessment. Prostate gland and seminal vesicles are unremarkable. Bilateral inguinal lymph nodes are noted. Largest right inguinal lymph node measures 2.2 x 1.5 cm. Largest left inguinal lymph node measures 2 by 1.6 cm. Clinical correlation is necessary to exclude adenopathy.  There is a right diaphragmatic lymph node measures 1.3 cm probable pathologic.  Sagittal images of the spine shows degenerative changes lumbar spine.  No hydronephrosis or hydroureter.  IMPRESSION: 1. There is segmental pneumatosis in right colonic wall. Small amount of extraluminal air anterior to the colon concerning for small pneumoperitoneum. There is linear air medial to the colon probable within  colonic vein. Findings are highly suspicious for segmental colitis probable ischemic. Clinical correlation is necessary. 2. No small bowel obstruction. 3. 8 mm nodule in right lower lobe posteriorly. Follow-up CT scan in 3-6 months is recommended to assure stability. There is mild enlarged lymph node in right diaphragm/right cardiophrenic angle. Pathologic adenopathy cannot be excluded. 4. Bilateral atrophic kidneys. No hydronephrosis or hydroureter. Bilateral renal cyst. 5. Bilateral inguinal enlarged lymph nodes. Clinical correlation is necessary to exclude inguinal adenopathy. 6. No distal colonic obstruction. 7. Discussed with Dr. Jonnie KindAlmahi   Electronically Signed   By: Natasha MeadLiviu  Pop M.D.   On: 08/21/2014 18:38   Dg Chest 2 View  08/21/2014   CLINICAL DATA:  Fatigue.  EXAM: CHEST  2 VIEW  COMPARISON:  None.  FINDINGS: Mediastinum and hilar structures are normal. Cardiomegaly. Pulmonary vascularity normal. Mild right basilar atelectasis versus infiltrate. No pleural effusion or pneumothorax. Degenerative changes thoracic spine P  IMPRESSION: 1.  Mild right base subsegmental atelectasis versus infiltrate.  2.  Stable cardiomegaly, no CHF.   Electronically Signed   By: Maisie Fushomas  Register   On: 08/21/2014 14:51    Microbiology: Recent Results (from the past 240 hour(s))  CULTURE, BLOOD (ROUTINE X 2)     Status: None   Collection Time    08/21/14  3:11 PM      Result Value Ref Range Status   Specimen Description BLOOD FOREARM RIGHT   Final   Special Requests BOTTLES DRAWN AEROBIC AND ANAEROBIC 5CC   Final   Culture  Setup Time     Final   Value: 08/21/2014 20:54     Performed at [facility]   Culture     Final   Value:        BLOOD CULTURE RECEIVED NO GROWTH TO DATE CULTURE WILL BE HELD FOR 5 DAYS BEFORE ISSUING A FINAL NEGATIVE REPORT     Performed at [facility]   Report Status PENDING   Incomplete  CULTURE, BLOOD (ROUTINE X 2)     Status: None   Collection Time    08/21/14  3:19  PM      Result Value Ref Range Status   Specimen Description BLOOD HAND RIGHT   Final   Special Requests BOTTLES DRAWN AEROBIC AND ANAEROBIC 5CC   Final   Culture  Setup Time     Final   Value: 08/21/2014 20:53     Performed at [facility]   Culture     Final   Value:        BLOOD CULTURE RECEIVED NO GROWTH TO DATE CULTURE WILL BE HELD FOR 5 DAYS BEFORE ISSUING A FINAL NEGATIVE REPORT     Performed  at [facility]   Report Status PENDING   Incomplete  MRSA PCR SCREENING     Status: None   Collection Time    08/21/14  8:22 PM      Result Value Ref Range Status   MRSA by PCR NEGATIVE  NEGATIVE Final   Comment:            The GeneXpert MRSA Assay (FDA     approved for NASAL specimens     only), is one component of a     comprehensive MRSA colonization     surveillance program. It is not     intended to diagnose MRSA     infection nor to guide or     monitor treatment for     MRSA infections.     Labs: Basic Metabolic Panel:  Recent Labs Lab 08/21/14 1325 08/22/14 0329 08/23/14 0930  NA 144 141 134*  K 3.3* 3.9 4.1  CL 100 100 94*  CO2 27 25 23   GLUCOSE 124* 116* 148*  BUN 16 21 30*  CREATININE 3.76* 4.61* 6.25*  CALCIUM 9.3 9.5 9.3   Liver Function Tests:  Recent Labs Lab 08/21/14 1325  AST 22  ALT 16  ALKPHOS 155*  BILITOT 0.8  PROT 8.3  ALBUMIN 3.3*   No results found for this basename: LIPASE, AMYLASE,  in the last 168 hours No results found for this basename: AMMONIA,  in the last 168 hours CBC:  Recent Labs Lab 08/21/14 1325 08/22/14 0329 08/23/14 0930  WBC 8.0 7.8 7.2  HGB 11.0* 10.5* 10.4*  HCT 33.4* 31.5* 31.6*  MCV 106.0* 106.1* 109.0*  PLT 239 222 226   Cardiac Enzymes: No results found for this basename: CKTOTAL, CKMB, CKMBINDEX, TROPONINI,  in the last 168 hours BNP: BNP (last 3 results)  Recent Labs  03/05/14 1853 08/21/14 1325  PROBNP 7838.0* 13770.0*   CBG:  Recent Labs Lab 08/22/14 2049 08/23/14 0908  08/23/14 1219 08/23/14 2056 08/24/14 0815  GLUCAP 91 150* 134* 114* 108*       Signed:  Dallie Patton L  [facility] 08/24/2014, 9:44 AM

## 2014-08-24 NOTE — Progress Notes (Signed)
Pt discharge instructions given, pt verbalized understanding. VSS.  Denies pain.  Cane delivered to room.  Pt left floor via wheelchair accompanied by staff and family with cane.

## 2014-08-27 LAB — CULTURE, BLOOD (ROUTINE X 2)
CULTURE: NO GROWTH
Culture: NO GROWTH

## 2014-09-05 IMAGING — CR DG CHEST 2V
2 series · 2 of 2 positions shown · non-contrast
Comparison: 09/13/2012

CLINICAL DATA: Follow-up edema.  Diabetes and hypertension.
Dialysis patient

CHEST - 2 VIEW

[w chest lat]
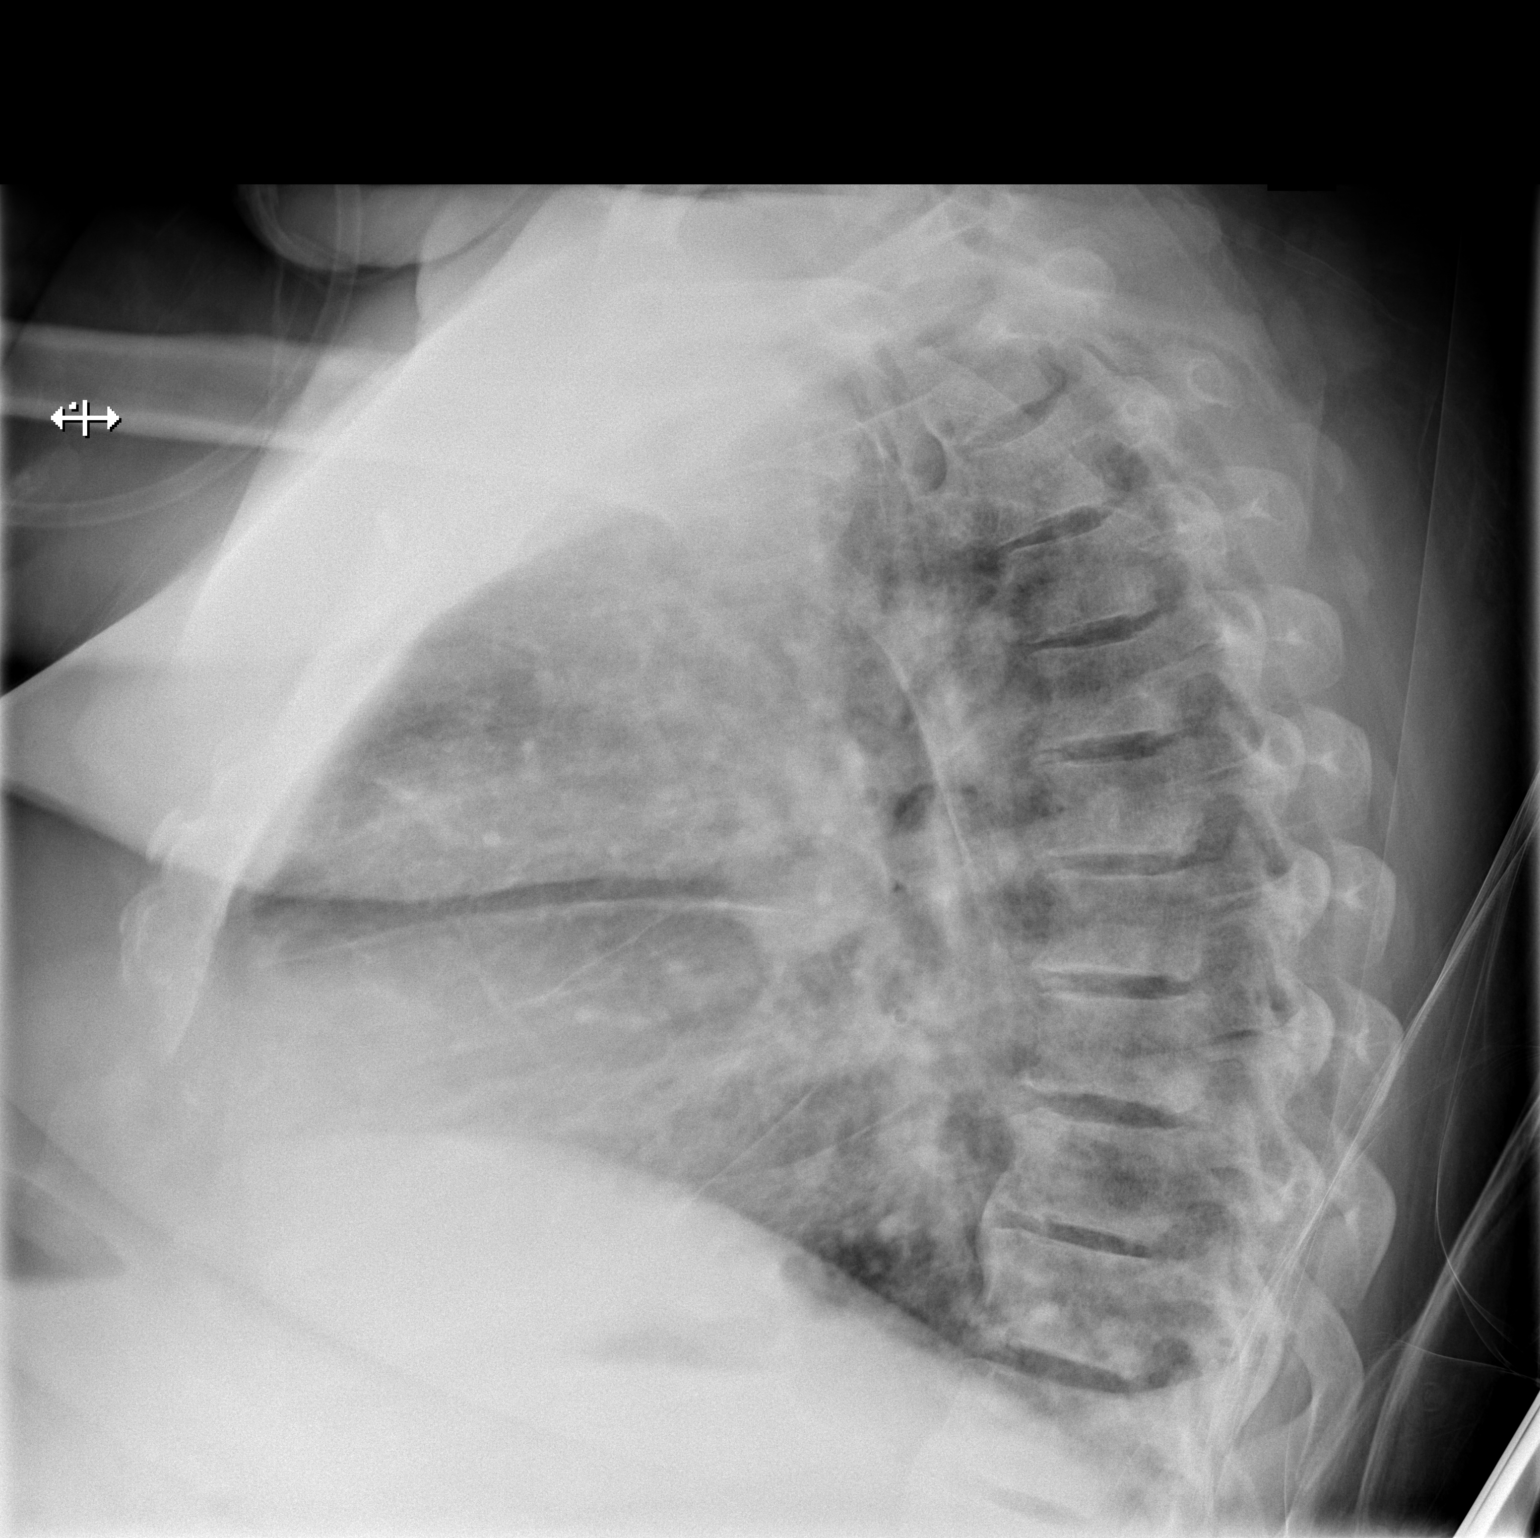

[x chest ap]
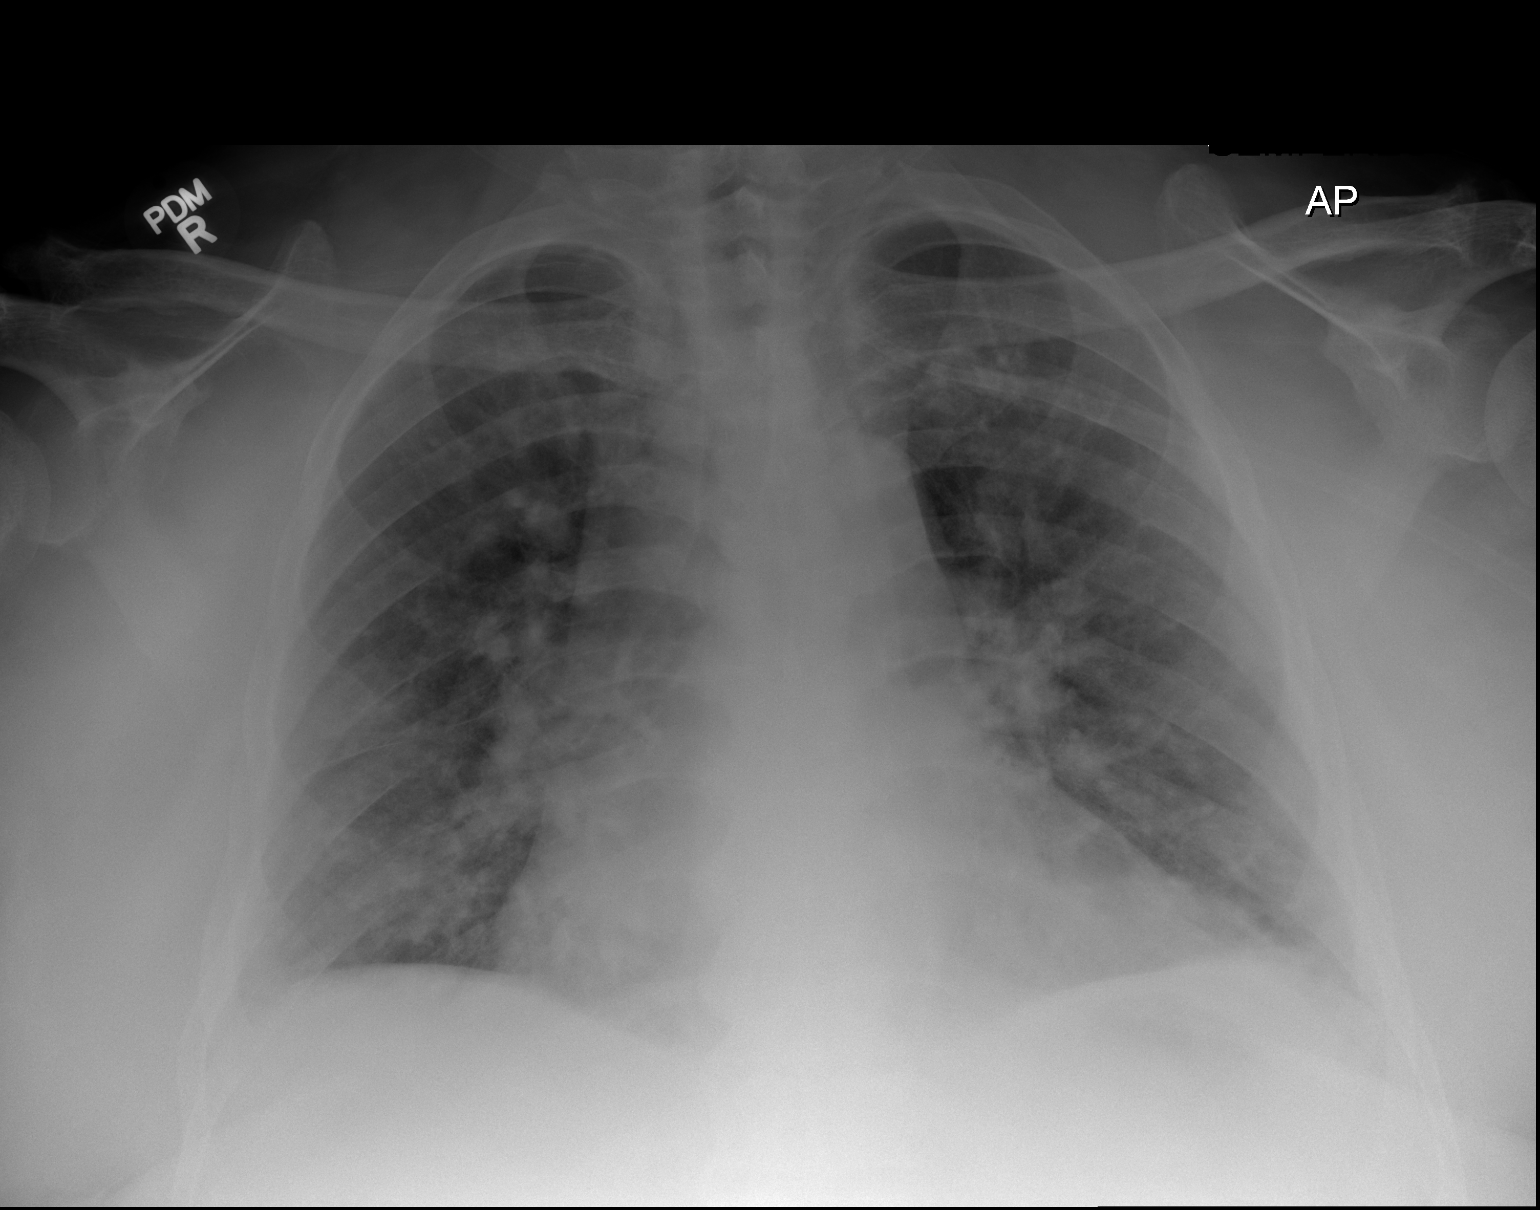

[2 of 2 positions shown; findings below may reference images not displayed]

FINDINGS: Cardiomegaly is identified and appears stable. There is
persistent pulmonary vascular congestion, small bilateral pleural
effusions and fissural fluid identified compatible with mild
pulmonary edema.  This is unchanged in comparison with the prior
exam.  No new focal infiltrates are identified.

Bony structures demonstrate some degenerative change of the lower
thoracic spine.
IMPRESSION: Unchanged pulmonary vascular congestion and mild pulmonary edema
pattern.

## 2014-10-16 ENCOUNTER — Inpatient Hospital Stay (HOSPITAL_COMMUNITY)
Admission: EM | Admit: 2014-10-16 | Discharge: 2014-10-19 | DRG: 393 | Disposition: A | Discharge status: Home Health | Payer: Medicare Other | Attending: Internal Medicine | Admitting: Internal Medicine

## 2014-10-16 ENCOUNTER — Inpatient Hospital Stay (HOSPITAL_COMMUNITY): Payer: Medicare Other

## 2014-10-16 ENCOUNTER — Encounter (HOSPITAL_COMMUNITY): Payer: Self-pay | Admitting: *Deleted

## 2014-10-16 DIAGNOSIS — I120 Hypertensive chronic kidney disease with stage 5 chronic kidney disease or end stage renal disease: Secondary | ICD-10-CM | POA: Diagnosis present

## 2014-10-16 DIAGNOSIS — Z6837 Body mass index (BMI) 37.0-37.9, adult: Secondary | ICD-10-CM | POA: Diagnosis not present

## 2014-10-16 DIAGNOSIS — K5731 Diverticulosis of large intestine without perforation or abscess with bleeding: Secondary | ICD-10-CM | POA: Diagnosis present

## 2014-10-16 DIAGNOSIS — E1122 Type 2 diabetes mellitus with diabetic chronic kidney disease: Secondary | ICD-10-CM

## 2014-10-16 DIAGNOSIS — N2581 Secondary hyperparathyroidism of renal origin: Secondary | ICD-10-CM | POA: Diagnosis present

## 2014-10-16 DIAGNOSIS — E1165 Type 2 diabetes mellitus with hyperglycemia: Secondary | ICD-10-CM | POA: Diagnosis present

## 2014-10-16 DIAGNOSIS — I4891 Unspecified atrial fibrillation: Secondary | ICD-10-CM | POA: Diagnosis present

## 2014-10-16 DIAGNOSIS — I4510 Unspecified right bundle-branch block: Secondary | ICD-10-CM | POA: Diagnosis present

## 2014-10-16 DIAGNOSIS — D631 Anemia in chronic kidney disease: Secondary | ICD-10-CM | POA: Diagnosis present

## 2014-10-16 DIAGNOSIS — M1990 Unspecified osteoarthritis, unspecified site: Secondary | ICD-10-CM | POA: Diagnosis present

## 2014-10-16 DIAGNOSIS — E1129 Type 2 diabetes mellitus with other diabetic kidney complication: Secondary | ICD-10-CM | POA: Diagnosis present

## 2014-10-16 DIAGNOSIS — K922 Gastrointestinal hemorrhage, unspecified: Secondary | ICD-10-CM | POA: Diagnosis present

## 2014-10-16 DIAGNOSIS — I9589 Other hypotension: Secondary | ICD-10-CM | POA: Diagnosis present

## 2014-10-16 DIAGNOSIS — I420 Dilated cardiomyopathy: Secondary | ICD-10-CM | POA: Diagnosis present

## 2014-10-16 DIAGNOSIS — Z833 Family history of diabetes mellitus: Secondary | ICD-10-CM

## 2014-10-16 DIAGNOSIS — L299 Pruritus, unspecified: Secondary | ICD-10-CM | POA: Diagnosis present

## 2014-10-16 DIAGNOSIS — E669 Obesity, unspecified: Secondary | ICD-10-CM | POA: Diagnosis present

## 2014-10-16 DIAGNOSIS — Z8249 Family history of ischemic heart disease and other diseases of the circulatory system: Secondary | ICD-10-CM | POA: Diagnosis not present

## 2014-10-16 DIAGNOSIS — I5022 Chronic systolic (congestive) heart failure: Secondary | ICD-10-CM | POA: Diagnosis present

## 2014-10-16 DIAGNOSIS — Z992 Dependence on renal dialysis: Secondary | ICD-10-CM

## 2014-10-16 DIAGNOSIS — M109 Gout, unspecified: Secondary | ICD-10-CM | POA: Diagnosis present

## 2014-10-16 DIAGNOSIS — N186 End stage renal disease: Secondary | ICD-10-CM | POA: Diagnosis present

## 2014-10-16 DIAGNOSIS — Z9889 Other specified postprocedural states: Secondary | ICD-10-CM

## 2014-10-16 DIAGNOSIS — Z79899 Other long term (current) drug therapy: Secondary | ICD-10-CM

## 2014-10-16 DIAGNOSIS — K559 Vascular disorder of intestine, unspecified: Principal | ICD-10-CM | POA: Diagnosis present

## 2014-10-16 DIAGNOSIS — K625 Hemorrhage of anus and rectum: Secondary | ICD-10-CM | POA: Diagnosis present

## 2014-10-16 DIAGNOSIS — N189 Chronic kidney disease, unspecified: Secondary | ICD-10-CM

## 2014-10-16 HISTORY — DX: Type 2 diabetes mellitus without complications: E11.9

## 2014-10-16 HISTORY — DX: Personal history of other diseases of the musculoskeletal system and connective tissue: Z87.39

## 2014-10-16 HISTORY — DX: Gastrointestinal hemorrhage, unspecified: K92.2

## 2014-10-16 LAB — RENAL FUNCTION PANEL
Albumin: 3.1 g/dL — ABNORMAL LOW (ref 3.5–5.2)
Anion gap: 18 — ABNORMAL HIGH (ref 5–15)
BUN: 42 mg/dL — ABNORMAL HIGH (ref 6–23)
CO2: 23 meq/L (ref 19–32)
Calcium: 8 mg/dL — ABNORMAL LOW (ref 8.4–10.5)
Chloride: 96 mEq/L (ref 96–112)
Creatinine, Ser: 6.82 mg/dL — ABNORMAL HIGH (ref 0.50–1.35)
GFR calc Af Amer: 9 mL/min — ABNORMAL LOW (ref >90)
GFR calc non Af Amer: 7 mL/min — ABNORMAL LOW (ref >90)
GLUCOSE: 108 mg/dL — AB (ref 70–99)
PHOSPHORUS: 4.3 mg/dL (ref 2.3–4.6)
POTASSIUM: 3.9 meq/L (ref 3.7–5.3)
Sodium: 137 mEq/L (ref 137–147)

## 2014-10-16 LAB — CBC
HCT: 31.5 % — ABNORMAL LOW (ref 39.0–52.0)
HEMATOCRIT: 33 % — AB (ref 39.0–52.0)
HEMOGLOBIN: 10.9 g/dL — AB (ref 13.0–17.0)
HEMOGLOBIN: 10.6 g/dL — AB (ref 13.0–17.0)
MCH: 35.5 pg — ABNORMAL HIGH (ref 26.0–34.0)
MCH: 36.6 pg — ABNORMAL HIGH (ref 26.0–34.0)
MCHC: 33 g/dL (ref 30.0–36.0)
MCHC: 33.7 g/dL (ref 30.0–36.0)
MCV: 107.5 fL — ABNORMAL HIGH (ref 78.0–100.0)
MCV: 108.6 fL — ABNORMAL HIGH (ref 78.0–100.0)
Platelets: 186 10*3/uL (ref 150–400)
Platelets: 175 10*3/uL (ref 150–400)
RBC: 3.07 MIL/uL — ABNORMAL LOW (ref 4.22–5.81)
RBC: 2.9 MIL/uL — AB (ref 4.22–5.81)
RDW: 17 % — AB (ref 11.5–15.5)
RDW: 16.8 % — ABNORMAL HIGH (ref 11.5–15.5)
WBC: 8.1 10*3/uL (ref 4.0–10.5)
WBC: 7.2 10*3/uL (ref 4.0–10.5)

## 2014-10-16 LAB — COMPREHENSIVE METABOLIC PANEL
ALBUMIN: 3.2 g/dL — AB (ref 3.5–5.2)
ALK PHOS: 186 U/L — AB (ref 39–117)
ALT: 19 U/L (ref 0–53)
AST: 24 U/L (ref 0–37)
Anion gap: 17 — ABNORMAL HIGH (ref 5–15)
BILIRUBIN TOTAL: 1.4 mg/dL — AB (ref 0.3–1.2)
BUN: 40 mg/dL — AB (ref 6–23)
CHLORIDE: 97 meq/L (ref 96–112)
CO2: 26 mEq/L (ref 19–32)
Calcium: 8.3 mg/dL — ABNORMAL LOW (ref 8.4–10.5)
Creatinine, Ser: 6.48 mg/dL — ABNORMAL HIGH (ref 0.50–1.35)
GFR calc non Af Amer: 8 mL/min — ABNORMAL LOW (ref >90)
GFR, EST AFRICAN AMERICAN: 9 mL/min — AB (ref >90)
GLUCOSE: 143 mg/dL — AB (ref 70–99)
POTASSIUM: 3.9 meq/L (ref 3.7–5.3)
SODIUM: 140 meq/L (ref 137–147)
Total Protein: 8.2 g/dL (ref 6.0–8.3)

## 2014-10-16 LAB — GLUCOSE, CAPILLARY
GLUCOSE-CAPILLARY: 79 mg/dL (ref 70–99)
Glucose-Capillary: 127 mg/dL — ABNORMAL HIGH (ref 70–99)

## 2014-10-16 LAB — APTT: APTT: 36 s (ref 24–37)

## 2014-10-16 LAB — MRSA PCR SCREENING: MRSA BY PCR: NEGATIVE

## 2014-10-16 LAB — PROTIME-INR
INR: 1.32 (ref 0.00–1.49)
PROTHROMBIN TIME: 16.5 s — AB (ref 11.6–15.2)

## 2014-10-16 LAB — TYPE AND SCREEN
ABO/RH(D): A POS
Antibody Screen: NEGATIVE

## 2014-10-16 LAB — CBG MONITORING, ED: GLUCOSE-CAPILLARY: 133 mg/dL — AB (ref 70–99)

## 2014-10-16 MED ORDER — HYDROXYZINE HCL 25 MG PO TABS
25.0000 mg | ORAL_TABLET | Freq: Three times a day (TID) | ORAL | Status: DC | PRN
  Administered 2014-10-16: 25 mg via ORAL

## 2014-10-16 MED ORDER — SODIUM CHLORIDE 0.9 % IV SOLN: 100.0000 mL | INTRAVENOUS | Status: DC | PRN

## 2014-10-16 MED ORDER — SODIUM CHLORIDE 0.9 % IV BOLUS (SEPSIS)
250.0000 mL | Freq: Once | INTRAVENOUS | Status: AC
  Administered 2014-10-16: 250 mL via INTRAVENOUS

## 2014-10-16 MED ORDER — DOXERCALCIFEROL 4 MCG/2ML IV SOLN
5.0000 ug | INTRAVENOUS | Status: DC
  Administered 2014-10-16 – 2014-10-18 (×2): 5 ug via INTRAVENOUS
  Filled 2014-10-16 (×2): qty 4

## 2014-10-16 MED ORDER — ACETAMINOPHEN 650 MG RE SUPP: 650.0000 mg | Freq: Four times a day (QID) | RECTAL | Status: DC | PRN

## 2014-10-16 MED ORDER — PENTAFLUOROPROP-TETRAFLUOROETH EX AERO
1.0000 "application " | INHALATION_SPRAY | CUTANEOUS | Status: DC | PRN
  Administered 2014-10-16: 1 via TOPICAL

## 2014-10-16 MED ORDER — INSULIN ASPART 100 UNIT/ML ~~LOC~~ SOLN
0.0000 [IU] | SUBCUTANEOUS | Status: DC
  Administered 2014-10-16: 1 [IU] via SUBCUTANEOUS
  Filled 2014-10-16 (×2): qty 1

## 2014-10-16 MED ORDER — PENTAFLUOROPROP-TETRAFLUOROETH EX AERO
INHALATION_SPRAY | CUTANEOUS | Status: AC
  Administered 2014-10-16: 1 via TOPICAL
  Filled 2014-10-16: qty 103.5

## 2014-10-16 MED ORDER — ONDANSETRON HCL 4 MG/2ML IJ SOLN: 4.0000 mg | Freq: Four times a day (QID) | INTRAMUSCULAR | Status: DC | PRN

## 2014-10-16 MED ORDER — LIDOCAINE-PRILOCAINE 2.5-2.5 % EX CREA: 1.0000 "application " | TOPICAL_CREAM | CUTANEOUS | Status: DC | PRN

## 2014-10-16 MED ORDER — LIDOCAINE HCL (PF) 1 % IJ SOLN: 5.0000 mL | INTRAMUSCULAR | Status: DC | PRN

## 2014-10-16 MED ORDER — LANTHANUM CARBONATE 500 MG PO CHEW
1000.0000 mg | CHEWABLE_TABLET | Freq: Two times a day (BID) | ORAL | Status: DC
  Administered 2014-10-16: 1000 mg via ORAL
  Filled 2014-10-16 (×4): qty 2

## 2014-10-16 MED ORDER — ONDANSETRON HCL 4 MG PO TABS: 4.0000 mg | ORAL_TABLET | Freq: Four times a day (QID) | ORAL | Status: DC | PRN

## 2014-10-16 MED ORDER — ALLOPURINOL 100 MG PO TABS
100.0000 mg | ORAL_TABLET | Freq: Every day | ORAL | Status: DC
  Administered 2014-10-16 – 2014-10-19 (×4): 100 mg via ORAL
  Filled 2014-10-16 (×4): qty 1

## 2014-10-16 MED ORDER — NEPRO/CARBSTEADY PO LIQD: 237.0000 mL | ORAL | Status: DC | PRN

## 2014-10-16 MED ORDER — DOXERCALCIFEROL 4 MCG/2ML IV SOLN
INTRAVENOUS | Status: AC
  Administered 2014-10-16: 5 ug via INTRAVENOUS
  Filled 2014-10-16: qty 4

## 2014-10-16 MED ORDER — IOHEXOL 300 MG/ML  SOLN
25.0000 mL | Freq: Once | INTRAMUSCULAR | Status: AC | PRN
  Administered 2014-10-16: 25 mL via ORAL

## 2014-10-16 MED ORDER — ATORVASTATIN CALCIUM 40 MG PO TABS
40.0000 mg | ORAL_TABLET | Freq: Every day | ORAL | Status: DC
  Administered 2014-10-16 – 2014-10-18 (×3): 40 mg via ORAL
  Filled 2014-10-16 (×4): qty 1

## 2014-10-16 MED ORDER — RENA-VITE PO TABS
1.0000 | ORAL_TABLET | Freq: Every day | ORAL | Status: DC
  Administered 2014-10-16: 1 via ORAL
  Filled 2014-10-16: qty 1

## 2014-10-16 MED ORDER — HYDROXYZINE HCL 25 MG PO TABS
ORAL_TABLET | ORAL | Status: AC
  Filled 2014-10-16: qty 1

## 2014-10-16 MED ORDER — ACETAMINOPHEN 325 MG PO TABS: 650.0000 mg | ORAL_TABLET | Freq: Four times a day (QID) | ORAL | Status: DC | PRN

## 2014-10-16 MED ORDER — LANTHANUM CARBONATE 500 MG PO CHEW
1000.0000 mg | CHEWABLE_TABLET | Freq: Three times a day (TID) | ORAL | Status: DC
  Administered 2014-10-17 – 2014-10-19 (×6): 1000 mg via ORAL
  Filled 2014-10-16 (×12): qty 2

## 2014-10-16 MED ORDER — HEPARIN SODIUM (PORCINE) 1000 UNIT/ML DIALYSIS
1000.0000 [IU] | INTRAMUSCULAR | Status: DC | PRN
Start: 2014-10-16 — End: 2014-10-16
  Filled 2014-10-16: qty 1

## 2014-10-16 MED ORDER — CAMPHOR-MENTHOL 0.5-0.5 % EX LOTN: TOPICAL_LOTION | CUTANEOUS | Status: DC | PRN

## 2014-10-16 MED ORDER — RENA-VITE PO TABS
1.0000 | ORAL_TABLET | Freq: Every day | ORAL | Status: DC
  Administered 2014-10-17 – 2014-10-18 (×2): 1 via ORAL
  Filled 2014-10-16 (×3): qty 1

## 2014-10-16 MED ORDER — ALTEPLASE 2 MG IJ SOLR
2.0000 mg | Freq: Once | INTRAMUSCULAR | Status: DC | PRN
  Filled 2014-10-16: qty 2

## 2014-10-16 MED ORDER — PANTOPRAZOLE SODIUM 40 MG IV SOLR
40.0000 mg | INTRAVENOUS | Status: DC
Start: 2014-10-16 — End: 2014-10-19
  Administered 2014-10-16 – 2014-10-19 (×4): 40 mg via INTRAVENOUS
  Filled 2014-10-16 (×5): qty 40

## 2014-10-16 MED ORDER — AMITRIPTYLINE HCL 50 MG PO TABS
50.0000 mg | ORAL_TABLET | Freq: Every day | ORAL | Status: DC
  Administered 2014-10-16 – 2014-10-18 (×3): 50 mg via ORAL
  Filled 2014-10-16 (×5): qty 1

## 2014-10-16 MED ORDER — CAMPHOR-MENTHOL 0.5-0.5 % EX LOTN
1.0000 "application " | TOPICAL_LOTION | Freq: Three times a day (TID) | CUTANEOUS | Status: DC | PRN
  Filled 2014-10-16 (×2): qty 222

## 2014-10-16 MED ORDER — IOHEXOL 300 MG/ML  SOLN
100.0000 mL | Freq: Once | INTRAMUSCULAR | Status: AC | PRN
  Administered 2014-10-16: 100 mL via INTRAVENOUS

## 2014-10-16 NOTE — ED Notes (Addendum)
Pt has returned from CT scan and lunch tray has been delivered is now ready for transport. Pt has belongings at bedside and is on the transport monitor.

## 2014-10-16 NOTE — Progress Notes (Signed)
Pt. Complains of itching since being on Tradjenta

## 2014-10-16 NOTE — Procedures (Signed)
Tolerating hemodialysis without any instability. Nicolas Mccann

## 2014-10-16 NOTE — ED Notes (Signed)
GI at bedside

## 2014-10-16 NOTE — Consult Note (Addendum)
[facility] Consult Note  Referring Provider: No ref. provider found Primary Care Physician:  Minerva Ends, MD Primary Gastroenterologist:  Dr.  Laurel Dimmer Complaint: Rectal bleeding HPI: Nicolas Mccann is an 68 y.o. black male  with end-stage renal disease on dialysis who presented in October with rectal bleeding presumed secondary to ischemic colitis suggested by CT scan. He was seen by Dr. Cristina Mccann and improved on antibiotics and was to see him in follow-up on November 24 to set up an elective colonoscopy that he never made this appointment. He had no further bleeding until he noticed 2 episodes of bright red blood this morning without any pain.Hemoglobin is 10.9 which is about his baseline.  Past Medical History  Diagnosis Date  . Obesity   . Gout   . Chest pain 03/05/2014  . Shortness of breath   . Arthritis     ALL OVER  . Diabetes mellitus without complication dx 1275  . Hypertension dx 1965  . ESRD on hemodialysis Dx 2005    [place], [name], ESRD due to DM/HTN, started dialysis in Sep 12, 2012  . Renal failure     Past Surgical History  Procedure Laterality Date  . Insertion of dialysis catheter  09/15/2012    Procedure: INSERTION OF DIALYSIS CATHETER;  Surgeon: Angelia Mould, MD;  Location: [facility] OR;  Service: Vascular;  Laterality: N/A;  . Menisectomy      L knee  . Av fistula placement Left      (Not in a hospital admission)  Allergies: No Known Allergies  Family History  Problem Relation Age of Onset  . Diabetes Mellitus II    . Hypertension    . Hypertension Mother   . Diabetes Mother   . Hypertension Father     Social History:  reports that he has never smoked. He quit smokeless tobacco use about 51 years ago. He reports that he does not drink alcohol or use illicit drugs.  Review of Systems: negative except as above  Blood pressure 95/64, pulse 93, temperature 97.5 F (36.4 C), temperature source Oral, resp. rate 18, height 6' 2.5" (1.892  m), weight 130.636 kg (288 lb), SpO2 96 %. Head: Normocephalic, without obvious abnormality, atraumatic Neck: no adenopathy, no carotid bruit, no JVD, supple, symmetrical, trachea midline and thyroid not enlarged, symmetric, no tenderness/mass/nodules Resp: clear to auscultation bilaterally Cardio: regular rate and rhythm, S1, S2 normal, no murmur, click, rub or gallop GI: Abdomen soft nondistended with normoactive bowel sounds hepatosplenomegaly mass or guarding  Extremities: extremities normal, atraumatic, no cyanosis or edema  Results for orders placed or performed during the hospital encounter of 10/16/14 (from the past 48 hour(s))  Type and screen     Status: None   Collection Time: 10/16/14  6:40 AM  Result Value Ref Range   ABO/RH(D) A POS    Antibody Screen NEG    Sample Expiration 10/19/2014   Comprehensive metabolic panel     Status: Abnormal   Collection Time: 10/16/14  6:44 AM  Result Value Ref Range   Sodium 140 137 - 147 mEq/L   Potassium 3.9 3.7 - 5.3 mEq/L   Chloride 97 96 - 112 mEq/L   CO2 26 19 - 32 mEq/L   Glucose, Bld 143 (H) 70 - 99 mg/dL   BUN 40 (H) 6 - 23 mg/dL   Creatinine, Ser 6.48 (H) 0.50 - 1.35 mg/dL   Calcium 8.3 (L) 8.4 - 10.5 mg/dL   Total Protein 8.2 6.0 -  8.3 g/dL   Albumin 3.2 (L) 3.5 - 5.2 g/dL   AST 24 0 - 37 U/L   ALT 19 0 - 53 U/L   Alkaline Phosphatase 186 (H) 39 - 117 U/L   Total Bilirubin 1.4 (H) 0.3 - 1.2 mg/dL   GFR calc non Af Amer 8 (L) >90 mL/min   GFR calc Af Amer 9 (L) >90 mL/min    Comment: (NOTE) The eGFR has been calculated using the CKD EPI equation. This calculation has not been validated in all clinical situations. eGFR's persistently <90 mL/min signify possible Chronic Kidney Disease.    Anion gap 17 (H) 5 - 15  CBC     Status: Abnormal   Collection Time: 10/16/14  6:44 AM  Result Value Ref Range   WBC 8.1 4.0 - 10.5 K/uL   RBC 3.07 (L) 4.22 - 5.81 MIL/uL   Hemoglobin 10.9 (L) 13.0 - 17.0 g/dL   HCT 33.0 (L) 39.0 -  52.0 %   MCV 107.5 (H) 78.0 - 100.0 fL   MCH 35.5 (H) 26.0 - 34.0 pg   MCHC 33.0 30.0 - 36.0 g/dL   RDW 17.0 (H) 11.5 - 15.5 %   Platelets 186 150 - 400 K/uL  Protime-INR     Status: Abnormal   Collection Time: 10/16/14  6:44 AM  Result Value Ref Range   Prothrombin Time 16.5 (H) 11.6 - 15.2 seconds   INR 1.32 0.00 - 1.49  APTT     Status: None   Collection Time: 10/16/14  6:44 AM  Result Value Ref Range   aPTT 36 24 - 37 seconds  CBG monitoring, ED     Status: Abnormal   Collection Time: 10/16/14  9:49 AM  Result Value Ref Range   Glucose-Capillary 133 (H) 70 - 99 mg/dL   No results found.  Assessment: Recurrent lower GI bleeding currently hemodynamically stable prior history of possible ischemic colitis. Repeat CT scan ordered and pending   Plan:  Will monitor stools and hemoglobin. Patient scheduled for dialysis today. Will plan clear liquid diet tomorrow and tentatively colonoscopy the day after that.  [name],Rector C 10/16/2014, 10:30 AM

## 2014-10-16 NOTE — ED Notes (Signed)
Nephrology at bedside

## 2014-10-16 NOTE — ED Provider Notes (Signed)
CSN: [id number]     Arrival date & time 10/16/14  0542 History   First MD Initiated Contact with Patient 10/16/14 [phone]     Chief Complaint  Patient presents with  . Rectal Bleeding     (Consider location/radiation/quality/duration/timing/severity/associated sxs/prior Treatment) HPI Patient presents with bright red blood per rectum. Started this morning. States he noticed it while wiping. He has some rectal discomfort. Denies any abdominal pain. No nausea vomiting or diarrhea. No fever or chills. Recently admitted for GI bleed. He has yet to receive outpatient colonoscopy. Admits to some mild lightheadedness especially when standing. Patient is also a dialysis patient. Gets dialysis on Monday Wednesday and Friday. Past Medical History  Diagnosis Date  . Obesity   . Gout   . Chest pain 03/05/2014  . Shortness of breath   . Arthritis     ALL OVER  . Diabetes mellitus without complication dx 1965  . Hypertension dx 1965  . ESRD on hemodialysis Dx 2005    [facility], [name], ESRD due to DM/HTN, started dialysis in Sep 12, 2012  . Renal failure    Past Surgical History  Procedure Laterality Date  . Insertion of dialysis catheter  09/15/2012    Procedure: INSERTION OF DIALYSIS CATHETER;  Surgeon: Chuck Hint, MD;  Location: [facility] OR;  Service: Vascular;  Laterality: N/A;  . Menisectomy      L knee  . Av fistula placement Left    Family History  Problem Relation Age of Onset  . Diabetes Mellitus II    . Hypertension    . Hypertension Mother   . Diabetes Mother   . Hypertension Father    History  Substance Use Topics  . Smoking status: Never Smoker   . Smokeless tobacco: Former Neurosurgeon    Quit date: 11/28/1962  . Alcohol Use: No    Review of Systems  Constitutional: Negative for fever and chills.  Respiratory: Negative for shortness of breath.   Cardiovascular: Negative for chest pain.  Gastrointestinal: Positive for anal bleeding. Negative for nausea, vomiting, abdominal  pain and diarrhea.  Musculoskeletal: Negative for back pain, neck pain and neck stiffness.  Skin: Negative for rash and wound.  Neurological: Positive for dizziness and light-headedness. Negative for weakness, numbness and headaches.  All other systems reviewed and are negative.     Allergies  Review of patient's allergies indicates no known allergies.  Home Medications   Prior to Admission medications   Medication Sig Start Date End Date Taking? Authorizing Provider  allopurinol (ZYLOPRIM) 100 MG tablet Take 100 mg by mouth daily. 06/25/14  Yes Historical Provider, MD  amitriptyline (ELAVIL) 50 MG tablet Take 50 mg by mouth at bedtime.   Yes Historical Provider, MD  atorvastatin (LIPITOR) 40 MG tablet Take 1 tablet (40 mg total) by mouth daily at 6 PM. 03/07/14  Yes Ricki Rodriguez, MD  b complex-vitamin c-folic acid (NEPHRO-VITE) 0.8 MG TABS tablet Take 0.8 mg by mouth daily.    Yes Historical Provider, MD  lanthanum (FOSRENOL) 1000 MG chewable tablet Chew 1,000 mg by mouth 2 (two) times daily with a meal.   Yes Historical Provider, MD  linagliptin (TRADJENTA) 5 MG TABS tablet Take 1 tablet (5 mg total) by mouth daily. 08/08/14  Yes Josalyn C Funches, MD  metoprolol tartrate (LOPRESSOR) 25 MG tablet Take 12.5 mg by mouth 2 (two) times daily.   Yes Historical Provider, MD  amitriptyline (ELAVIL) 25 MG tablet Take 25 mg by mouth at bedtime as  needed for sleep.     Historical Provider, MD  ciprofloxacin (CIPRO) 500 MG tablet Take 1 tablet (500 mg total) by mouth daily. Patient not taking: Reported on 10/16/2014 08/24/14   Christiane Ha, MD  metroNIDAZOLE (FLAGYL) 500 MG tablet Take 1 tablet (500 mg total) by mouth every 8 (eight) hours. Patient not taking: Reported on 10/16/2014 08/24/14   Christiane Ha, MD  midodrine (PROAMATINE) 10 MG tablet Take 1 tablet (10 mg total) by mouth 2 (two) times daily with a meal. Patient not taking: Reported on 10/16/2014 08/24/14   Christiane Ha,  MD  Nutritional Supplements (FEEDING SUPPLEMENT, NEPRO CARB STEADY,) LIQD Take 237 mLs by mouth as needed (missed meal during dialysis.). Patient not taking: Reported on 10/16/2014 07/26/14   Russella Dar, NP  saccharomyces boulardii (FLORASTOR) 250 MG capsule Take 1 capsule (250 mg total) by mouth 2 (two) times daily. Patient not taking: Reported on 10/16/2014 08/24/14   Christiane Ha, MD   BP 108/95 mmHg  Pulse 93  Temp(Src) 97.5 F (36.4 C) (Oral)  Resp 15  Ht 6' 2.5" (1.892 m)  Wt 288 lb (130.636 kg)  BMI 36.49 kg/m2  SpO2 100% Physical Exam  Constitutional: He is oriented to person, place, and time. He appears well-developed and well-nourished. No distress.  HENT:  Head: Normocephalic and atraumatic.  Mouth/Throat: Oropharynx is clear and moist.  Eyes: EOM are normal. Pupils are equal, round, and reactive to light.  Neck: Normal range of motion. Neck supple.  Cardiovascular:  Irregularly irregular  Pulmonary/Chest: Effort normal and breath sounds normal. No respiratory distress. He has no wheezes. He has no rales.  Abdominal: Soft. Bowel sounds are normal. He exhibits no distension and no mass. There is no tenderness. There is no rebound and no guarding.  Genitourinary:  External hemorrhoids with gross red blood on exam.  Musculoskeletal: Normal range of motion. He exhibits no edema or tenderness.  Neurological: He is alert and oriented to person, place, and time.  Skin: Skin is warm and dry. No rash noted. No erythema.  Psychiatric: He has a normal mood and affect. His behavior is normal.  Nursing note and vitals reviewed.   ED Course  Procedures (including critical care time) Labs Review Labs Reviewed  COMPREHENSIVE METABOLIC PANEL - Abnormal; Notable for the following:    Glucose, Bld 143 (*)    BUN 40 (*)    Creatinine, Ser 6.48 (*)    Calcium 8.3 (*)    Albumin 3.2 (*)    Alkaline Phosphatase 186 (*)    Total Bilirubin 1.4 (*)    GFR calc non Af Amer 8  (*)    GFR calc Af Amer 9 (*)    Anion gap 17 (*)    All other components within normal limits  CBC - Abnormal; Notable for the following:    RBC 3.07 (*)    Hemoglobin 10.9 (*)    HCT 33.0 (*)    MCV 107.5 (*)    MCH 35.5 (*)    RDW 17.0 (*)    All other components within normal limits  PROTIME-INR - Abnormal; Notable for the following:    Prothrombin Time 16.5 (*)    All other components within normal limits  APTT  TYPE AND SCREEN    Imaging Review No results found.   EKG Interpretation None      Date: 10/16/2014  Rate: 107  Rhythm: atrial fibrillation  QRS Axis: indeterminate  Intervals: QRS prolonged  ST/T Wave abnormalities: nonspecific T wave changes  Conduction Disutrbances:right bundle branch block  Narrative Interpretation:   Old EKG Reviewed: unchanged   MDM   Final diagnoses:  Lower gastrointestinal bleeding      Patient remains stable in the emergency department. Discuss with Dr. Blake DivineAkula and will admit to a step down bed.  Loren Raceravid Candise Crabtree, MD 10/16/14 [phone]

## 2014-10-16 NOTE — H&P (Addendum)
[facility] History and Physical  TIGHE GITTO ZOX:[id number] DOB: 10/25/1946 DOA: 10/16/2014  Referring physician: EDP Dr Ranae Palms PCP: Lora Paula, MD   Chief Complaint: BRBPR this am.   HPI: Nicolas Mccann is a 68 y.o. male with prior h/o hypertension, atrial fibrillation not on anticoagulation, chronic systolic heart failure, ESRD on HD on MWF, chronic anemia, h/o GI bleed , ? Ischemic colitis, gout , Dm type 2 , comes in for loose stools yesterday, followed by BRBPR this am, one episode, . He denies any abdominal pain, nausea, or vomiting. Reports dizziness on standing. No fever or chills. He never had a colonoscopy. He came in October for similar complaints and was discharged home to follow up with Dr Matthias Hughs as outpatient for colonoscopy. He reports missing the NOV 24th appt to the Gi office. His baseline hemoglobin is around 10 and his hemoglobin is 10.9 today. He is also due for his HD today. He denies any other complaints. He was referred to medical service for admission for evaluation of GI bleed.    Review of Systems:  Constitutional:  No weight loss, night sweats, Fevers, chills, fatigue.  HEENT:  No headaches, Difficulty swallowing,Tooth/dental problems,Sore throat,  No sneezing, itching, ear ache, nasal congestion, post nasal drip,  Cardio-vascular:  No chest pain, Orthopnea, PND, swelling in lower extremities, anasarca, dizziness, palpitations  GI:  See HPI for pertinent positive history.  Resp:  No shortness of breath with exertion or at rest. No excess mucus, no productive cough, No non-productive cough, No coughing up of blood.No change in color of mucus.No wheezing.No chest wall deformity  Skin:  White papules over the right forearm , itching in nature.  GU:  no dysuria, change in color of urine, no urgency or frequency. No flank pain.  Musculoskeletal:  No joint pain or swelling. No decreased range of motion. No back pain.    Past Medical History    Diagnosis Date  . Obesity   . Gout   . Chest pain 03/05/2014  . Shortness of breath   . Arthritis     ALL OVER  . Diabetes mellitus without complication dx 1965  . Hypertension dx 1965  . ESRD on hemodialysis Dx 2005    [facility], [name], ESRD due to DM/HTN, started dialysis in Sep 12, 2012  . Renal failure    Past Surgical History  Procedure Laterality Date  . Insertion of dialysis catheter  09/15/2012    Procedure: INSERTION OF DIALYSIS CATHETER;  Surgeon: Chuck Hint, MD;  Location: [facility] OR;  Service: Vascular;  Laterality: N/A;  . Menisectomy      L knee  . Av fistula placement Left    Social History:  reports that he has never smoked. He quit smokeless tobacco use about 51 years ago. He reports that he does not drink alcohol or use illicit drugs.  No Known Allergies  Family History  Problem Relation Age of Onset  . Diabetes Mellitus II    . Hypertension    . Hypertension Mother   . Diabetes Mother   . Hypertension Father      Prior to Admission medications   Medication Sig Start Date End Date Taking? Authorizing Provider  allopurinol (ZYLOPRIM) 100 MG tablet Take 100 mg by mouth daily. 06/25/14  Yes Historical Provider, MD  amitriptyline (ELAVIL) 50 MG tablet Take 50 mg by mouth at bedtime.   Yes Historical Provider, MD  atorvastatin (LIPITOR) 40 MG tablet Take 1 tablet (40 mg  total) by mouth daily at 6 PM. 03/07/14  Yes Ricki Rodriguez, MD  b complex-vitamin c-folic acid (NEPHRO-VITE) 0.8 MG TABS tablet Take 0.8 mg by mouth daily.    Yes Historical Provider, MD  lanthanum (FOSRENOL) 1000 MG chewable tablet Chew 1,000 mg by mouth 2 (two) times daily with a meal.   Yes Historical Provider, MD  linagliptin (TRADJENTA) 5 MG TABS tablet Take 1 tablet (5 mg total) by mouth daily. 08/08/14  Yes Josalyn C Funches, MD  metoprolol tartrate (LOPRESSOR) 25 MG tablet Take 12.5 mg by mouth 2 (two) times daily.   Yes Historical Provider, MD  amitriptyline (ELAVIL) 25 MG tablet Take  25 mg by mouth at bedtime as needed for sleep.     Historical Provider, MD  ciprofloxacin (CIPRO) 500 MG tablet Take 1 tablet (500 mg total) by mouth daily. Patient not taking: Reported on 10/16/2014 08/24/14   Christiane Ha, MD  metroNIDAZOLE (FLAGYL) 500 MG tablet Take 1 tablet (500 mg total) by mouth every 8 (eight) hours. Patient not taking: Reported on 10/16/2014 08/24/14   Christiane Ha, MD  midodrine (PROAMATINE) 10 MG tablet Take 1 tablet (10 mg total) by mouth 2 (two) times daily with a meal. Patient not taking: Reported on 10/16/2014 08/24/14   Christiane Ha, MD  Nutritional Supplements (FEEDING SUPPLEMENT, NEPRO CARB STEADY,) LIQD Take 237 mLs by mouth as needed (missed meal during dialysis.). Patient not taking: Reported on 10/16/2014 07/26/14   Russella Dar, NP  saccharomyces boulardii (FLORASTOR) 250 MG capsule Take 1 capsule (250 mg total) by mouth 2 (two) times daily. Patient not taking: Reported on 10/16/2014 08/24/14   Christiane Ha, MD   Physical Exam: Filed Vitals:   10/16/14 0830 10/16/14 0845 10/16/14 0900 10/16/14 0915  BP: 110/97 96/77 97/66  116/75  Pulse: 95 95 99 95  Temp:      TempSrc:      Resp: 17 24  20   Height:      Weight:      SpO2: 99% 96% 99% 99%    Wt Readings from Last 3 Encounters:  10/16/14 130.636 kg (288 lb)  08/23/14 129.502 kg (285 lb 8 oz)  08/08/14 132.45 kg (292 lb)    General:  Appears to be in mild discomfort Eyes: PERRL, normal lids, irises & conjunctiva Neck: no LAD, masses or thyromegaly Cardiovascular: irregularly irregular tachycardic. no m/r/g. 1+ leg edema.  Respiratory: CTA bilaterally, no w/r/r. Normal respiratory effort. Gastrointestinal:soft, ntnd, bowel sounds heard.  Skin: white papules over the right forearm.  Musculoskeletal: grossly normal tone BUE/BLE Neurologic: grossly non-focal.          Labs on Admission:  Basic Metabolic Panel:  Recent Labs Lab 10/16/14 0644  NA 140  K 3.9  CL 97    CO2 26  GLUCOSE 143*  BUN 40*  CREATININE 6.48*  CALCIUM 8.3*   Liver Function Tests:  Recent Labs Lab 10/16/14 0644  AST 24  ALT 19  ALKPHOS 186*  BILITOT 1.4*  PROT 8.2  ALBUMIN 3.2*   No results for input(s): LIPASE, AMYLASE in the last 168 hours. No results for input(s): AMMONIA in the last 168 hours. CBC:  Recent Labs Lab 10/16/14 0644  WBC 8.1  HGB 10.9*  HCT 33.0*  MCV 107.5*  PLT 186   Cardiac Enzymes: No results for input(s): CKTOTAL, CKMB, CKMBINDEX, TROPONINI in the last 168 hours.  BNP (last 3 results)  Recent Labs  03/05/14 1853 08/21/14 1325  PROBNP 7838.0* 13770.0*   CBG: No results for input(s): GLUCAP in the last 168 hours.  Radiological Exams on Admission: No results found.  EKG: atrial fib with RBBB.   Assessment/Plan Active Problems:   CKD (chronic kidney disease) stage V requiring chronic dialysis   Anemia in chronic renal disease   Diabetes mellitus with renal complications   Congestive dilated cardiomyopathy/EF 35-40%   Rectal bleeding   Lower GI bleeding   BRBPR (bright red blood per rectum)  BRBPR: Admitted to telemetry. Would keep him NPO till Gastroenterology evaluates him. Rectal bleed possibly ? ischemic colitis vs hemorrhoidal. No drop in hemoglobin , does not require any transfusion at this time.  [facility].  IV protonix daily.    ESRD on HD MWF: Further management as per renal.  Dr Lowell GuitarPowell consulted.    Atrial fibrillation:  Rate slightly higher than baseline. As per last d/c summary, metoprolol to be discontinued because of orthostatic hypotension.  Was on aspirin 81 mg in the month of April. It was discontinued later on and is not on any anticoagulation at this time.    Hypertension:  Off meds.    Diabetes Mellitus: hgba1c ordered and pending.  SSI  Chronic systolic heart failure/ dilated cardiomyopathy: Appears t be compensated.         Code  Status:full code.  DVT Prophylaxis: SCD's Family Communication: none at bedside Disposition Plan: admit to telemetry.   Time spent: 55 min  [facility] Center[facility]UL[facility],Brianna Esson [facility] Pager [phone]

## 2014-10-16 NOTE — ED Notes (Signed)
Spoke with RN from 6E who reports pt's room on 6E must be deep cleaned due to previous pt. 6E RN will alert me when bed is ready.

## 2014-10-16 NOTE — ED Notes (Signed)
Admitting MD at bedside.

## 2014-10-16 NOTE — Consult Note (Signed)
[facility] Renal Consultation Note  Indication for Consultation:  Management of ESRD/hemodialysis; anemia, hypertension/volume and secondary hyperparathyroidism  HPI: Nicolas Mccann is a 68 y.o. male with a history of Diabetes Type 2, hypertension, CHF with dilated cardiomyopathy, and ESRD on dialysis at the [facility] who was hospitalized 10/14-17 for rectal bleeding, believed to be secondary to ischemic colitis per CT, resolved on PO Cipro and Flagyl per Dr. Cristina Gong, but did not follow-up as scheduled on 11/24 to set up an elective colonoscopy and returned to the ED today after noticing bright red blood on tissue paper during regular bowel movement this morning.  His hemoglobin is presently stable at 10.9, but he will be admitted for further evaluation with colonoscopy likely in the next two days.  He occasionally has symptoms of orthostatic hypotension and will have his weight evaluated during his regularly scheduled dialysis today.  He also complains of a pruritic rash over his upper body and arms which he has noticed since his last admission.     Dialysis Orders:  MWF @ [place] 4:15     130.5 kg     2K/2Ca       450/800      Profile 4      No heparin      AVF @ [facility] Hectorol 5 mcg       No Aranesp       Venofer 100 x 10 (through 12/23)  Past Medical History  Diagnosis Date  . Obesity   . Gout   . Chest pain 03/05/2014  . Shortness of breath   . Arthritis     ALL OVER  . Diabetes mellitus without complication dx 1594  . Hypertension dx 1965  . ESRD on hemodialysis Dx 2005    [place], [name], ESRD due to DM/HTN, started dialysis in Sep 12, 2012  . Renal failure    Past Surgical History  Procedure Laterality Date  . Insertion of dialysis catheter  09/15/2012    Procedure: INSERTION OF DIALYSIS CATHETER;  Surgeon: Angelia Mould, MD;  Location: [facility] OR;  Service: Vascular;  Laterality: N/A;  . Menisectomy      L knee  . Av fistula placement Left     Family History  Problem Relation Age of Onset  . Diabetes Mellitus II    . Hypertension    . Hypertension Mother   . Diabetes Mother   . Hypertension Father    Social History He previously smoked cigars and a pipe, but denies any recent use of tobacco.  He occasionally drinks a beer, but has quit most alcohol use.  He denies any history of illicit drug use. 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   No Known Allergies Prior to Admission medications   Medication Sig Start Date End Date Taking? Authorizing Provider  allopurinol (ZYLOPRIM) 100 MG tablet Take 100 mg by mouth daily. 06/25/14  Yes Historical Provider, MD  amitriptyline (ELAVIL) 50 MG tablet Take 50 mg by mouth at bedtime.   Yes Historical Provider, MD  atorvastatin (LIPITOR) 40 MG tablet Take 1 tablet (40 mg total) by mouth daily at 6 PM. 03/07/14  Yes Birdie Riddle, MD  b complex-vitamin c-folic acid (NEPHRO-VITE) 0.8 MG TABS tablet Take 0.8 mg by mouth daily.    Yes Historical Provider, MD  lanthanum (FOSRENOL) 1000 MG chewable tablet Chew 1,000 mg by mouth 2 (two) times daily with a meal.   Yes  Historical Provider, MD  linagliptin (TRADJENTA) 5 MG TABS tablet Take 1 tablet (5 mg total) by mouth daily. 08/08/14  Yes Josalyn C Funches, MD  metoprolol tartrate (LOPRESSOR) 25 MG tablet Take 12.5 mg by mouth 2 (two) times daily.   Yes Historical Provider, MD  amitriptyline (ELAVIL) 25 MG tablet Take 25 mg by mouth at bedtime as needed for sleep.     Historical Provider, MD  ciprofloxacin (CIPRO) 500 MG tablet Take 1 tablet (500 mg total) by mouth daily. Patient not taking: Reported on 10/16/2014 08/24/14   Delfina Redwood, MD  metroNIDAZOLE (FLAGYL) 500 MG tablet Take 1 tablet (500 mg total) by mouth every 8 (eight) hours. Patient not taking: Reported on 10/16/2014 08/24/14   Delfina Redwood, MD  midodrine (PROAMATINE) 10 MG tablet Take 1 tablet (10 mg total) by mouth 2 (two) times daily with a  meal. Patient not taking: Reported on 10/16/2014 08/24/14   Delfina Redwood, MD  Nutritional Supplements (FEEDING SUPPLEMENT, NEPRO CARB STEADY,) LIQD Take 237 mLs by mouth as needed (missed meal during dialysis.). Patient not taking: Reported on 10/16/2014 07/26/14   Samella Parr, NP  saccharomyces boulardii (FLORASTOR) 250 MG capsule Take 1 capsule (250 mg total) by mouth 2 (two) times daily. Patient not taking: Reported on 10/16/2014 08/24/14   Delfina Redwood, MD   Labs:  Results for orders placed or performed during the hospital encounter of 10/16/14 (from the past 48 hour(s))  Type and screen     Status: None   Collection Time: 10/16/14  6:40 AM  Result Value Ref Range   ABO/RH(D) A POS    Antibody Screen NEG    Sample Expiration 10/19/2014   Comprehensive metabolic panel     Status: Abnormal   Collection Time: 10/16/14  6:44 AM  Result Value Ref Range   Sodium 140 137 - 147 mEq/L   Potassium 3.9 3.7 - 5.3 mEq/L   Chloride 97 96 - 112 mEq/L   CO2 26 19 - 32 mEq/L   Glucose, Bld 143 (H) 70 - 99 mg/dL   BUN 40 (H) 6 - 23 mg/dL   Creatinine, Ser 6.48 (H) 0.50 - 1.35 mg/dL   Calcium 8.3 (L) 8.4 - 10.5 mg/dL   Total Protein 8.2 6.0 - 8.3 g/dL   Albumin 3.2 (L) 3.5 - 5.2 g/dL   AST 24 0 - 37 U/L   ALT 19 0 - 53 U/L   Alkaline Phosphatase 186 (H) 39 - 117 U/L   Total Bilirubin 1.4 (H) 0.3 - 1.2 mg/dL   GFR calc non Af Amer 8 (L) >90 mL/min   GFR calc Af Amer 9 (L) >90 mL/min    Comment: (NOTE) The eGFR has been calculated using the CKD EPI equation. This calculation has not been validated in all clinical situations. eGFR's persistently <90 mL/min signify possible Chronic Kidney Disease.    Anion gap 17 (H) 5 - 15  CBC     Status: Abnormal   Collection Time: 10/16/14  6:44 AM  Result Value Ref Range   WBC 8.1 4.0 - 10.5 K/uL   RBC 3.07 (L) 4.22 - 5.81 MIL/uL   Hemoglobin 10.9 (L) 13.0 - 17.0 g/dL   HCT 33.0 (L) 39.0 - 52.0 %   MCV 107.5 (H) 78.0 - 100.0 fL   MCH  35.5 (H) 26.0 - 34.0 pg   MCHC 33.0 30.0 - 36.0 g/dL   RDW 17.0 (H) 11.5 - 15.5 %   Platelets  186 150 - 400 K/uL  Protime-INR     Status: Abnormal   Collection Time: 10/16/14  6:44 AM  Result Value Ref Range   Prothrombin Time 16.5 (H) 11.6 - 15.2 seconds   INR 1.32 0.00 - 1.49  APTT     Status: None   Collection Time: 10/16/14  6:44 AM  Result Value Ref Range   aPTT 36 24 - 37 seconds  CBG monitoring, ED     Status: Abnormal   Collection Time: 10/16/14  9:49 AM  Result Value Ref Range   Glucose-Capillary 133 (H) 70 - 99 mg/dL   Constitutional: negative for chills, fatigue, fevers and sweats Ears, nose, mouth, throat, and face: negative for earaches, hoarseness, nasal congestion and sore throat Respiratory: negative for cough, dyspnea on exertion, hemoptysis and sputum Cardiovascular: negative for chest pain, chest pressure/discomfort, dyspnea, orthopnea and palpitations Gastrointestinal: positive for hematochezia, negative for abdominal pain, change in bowel habits, nausea and vomiting Genitourinary:negative, anuric Musculoskeletal:negative for arthralgias, back pain, myalgias and neck pain Neurological: positive for dizziness, negative for gait problems, headaches, speech problems and weakness Skin: positive for pruritic rash over back and arms  Physical Exam: Filed Vitals:   10/16/14 1003  BP: 95/64  Pulse: 93  Temp:   Resp: 18     General appearance: alert, cooperative and no distress Head: Normocephalic, without obvious abnormality, atraumatic Neck: no adenopathy, no carotid bruit, no JVD and supple, symmetrical, trachea midline Resp: clear to auscultation bilaterally Cardio: regular rate and rhythm, S1, S2 normal, no murmur, click, rub or gallop GI: soft, non-tender; bowel sounds normal; no masses,  no organomegaly Extremities: lower extremities with 1+ edema, diffuse papular rash over forearms Neurologic: Grossly normal Dialysis Access: AVF @ [facility] with faint bruit    Assessment/Plan: 1. Rectal bleeding - previous episode in Oct, treated for ischemic colitis with Cipro & Flagyl, missed follow-up with Dr. Cristina Gong 11/24 to arrange colonoscopy.  Rechecking CT, planning clear-liquid diet for colonoscopy per GI.    2. ESRD - HD on MWF @ [place], K 3.9.  HD pending. 3. Hypertension/volume - BP 95/64 with symptoms of orthostatic hypotension, no meds; wt 130.6 kg, @ EDW. 4. Anemia - Hgb 10.9, no Aranesp, Venofer 100 mg x 10.  5. Metabolic bone disease - Ca 8.3 (8.9 corrected), last P 3.5, iPTH 1115; Hectorol 5 mcg, Fosrenol 1 g with meals. 6. Nutrition - Alb 3.2, renal diet, vitamin. 7. DM - per primary  LYLES,CHARLES 10/16/2014, 10:48 AM   Attending Nephrologist: Erling Cruz, MD   Renal Attending: Agree with note articulated above. Pt with some blood on tissue with recent colitis possibly related to ischemia, plan for HD today and per GI. Rada Zegers C

## 2014-10-16 NOTE — ED Notes (Addendum)
Pt reports having rectal bleeding this am, denies any pain. Last bm was this morning. Dialysis pt, last treatment was Monday.

## 2014-10-17 ENCOUNTER — Encounter (HOSPITAL_COMMUNITY): Payer: Self-pay | Admitting: Vascular Surgery

## 2014-10-17 DIAGNOSIS — I420 Dilated cardiomyopathy: Secondary | ICD-10-CM

## 2014-10-17 DIAGNOSIS — D631 Anemia in chronic kidney disease: Secondary | ICD-10-CM

## 2014-10-17 LAB — GLUCOSE, CAPILLARY
GLUCOSE-CAPILLARY: 142 mg/dL — AB (ref 70–99)
Glucose-Capillary: 108 mg/dL — ABNORMAL HIGH (ref 70–99)
Glucose-Capillary: 159 mg/dL — ABNORMAL HIGH (ref 70–99)
Glucose-Capillary: 123 mg/dL — ABNORMAL HIGH (ref 70–99)
Glucose-Capillary: 106 mg/dL — ABNORMAL HIGH (ref 70–99)
Glucose-Capillary: 111 mg/dL — ABNORMAL HIGH (ref 70–99)

## 2014-10-17 LAB — HEMOGLOBIN A1C
HEMOGLOBIN A1C: 6.9 % — AB (ref <5.7)
Mean Plasma Glucose: 151 mg/dL — ABNORMAL HIGH (ref <117)

## 2014-10-17 MED ORDER — SODIUM CHLORIDE 0.9 % IV SOLN
INTRAVENOUS | Status: DC
  Administered 2014-10-17: 10:00:00 via INTRAVENOUS

## 2014-10-17 MED ORDER — BISACODYL 5 MG PO TBEC
10.0000 mg | DELAYED_RELEASE_TABLET | Freq: Once | ORAL | Status: AC
  Administered 2014-10-17: 10 mg via ORAL
  Filled 2014-10-17: qty 2

## 2014-10-17 MED ORDER — MIDODRINE HCL 5 MG PO TABS
10.0000 mg | ORAL_TABLET | Freq: Three times a day (TID) | ORAL | Status: DC
  Administered 2014-10-17 – 2014-10-19 (×6): 10 mg via ORAL
  Filled 2014-10-17 (×10): qty 2

## 2014-10-17 MED ORDER — PEG 3350-KCL-NA BICARB-NACL 420 G PO SOLR
4000.0000 mL | Freq: Once | ORAL | Status: AC
  Administered 2014-10-17: 4000 mL via ORAL
  Filled 2014-10-17: qty 4000

## 2014-10-17 NOTE — Progress Notes (Signed)
[facility] Progress Note  Subjective: No new complaints, no rectal bleeding or abdominal pain  Objective: Vital signs in last 24 hours: Temp:  [97.3 F (36.3 C)-98.4 F (36.9 C)] 97.9 F (36.6 C) (12/10 0909) Pulse Rate:  [73-99] 86 (12/10 0909) Resp:  [16-20] 19 (12/10 0909) BP: (82-123)/(56-73) 85/56 mmHg (12/10 0909) SpO2:  [95 %-100 %] 99 % (12/10 0909) Weight:  [135.4 kg (298 lb 8.1 oz)-135.9 kg (299 lb 9.7 oz)] 135.9 kg (299 lb 9.7 oz) (12/09 2057) Weight change: 5.264 kg (11 lb 9.7 oz)   PE: Unchanged  Lab Results: Results for orders placed or performed during the hospital encounter of 10/16/14 (from the past 24 hour(s))  CBG monitoring, ED     Status: Abnormal   Collection Time: 10/16/14  9:49 AM  Result Value Ref Range   Glucose-Capillary 133 (H) 70 - 99 mg/dL  CBC     Status: Abnormal   Collection Time: 10/16/14 11:37 AM  Result Value Ref Range   WBC 7.2 4.0 - 10.5 K/uL   RBC 2.90 (L) 4.22 - 5.81 MIL/uL   Hemoglobin 10.6 (L) 13.0 - 17.0 g/dL   HCT 16.131.5 (L) 09.639.0 - 04.552.0 %   MCV 108.6 (H) 78.0 - 100.0 fL   MCH 36.6 (H) 26.0 - 34.0 pg   MCHC 33.7 30.0 - 36.0 g/dL   RDW 40.916.8 (H) 81.111.5 - 91.415.5 %   Platelets 175 150 - 400 K/uL  Renal function panel     Status: Abnormal   Collection Time: 10/16/14 11:37 AM  Result Value Ref Range   Sodium 137 137 - 147 mEq/L   Potassium 3.9 3.7 - 5.3 mEq/L   Chloride 96 96 - 112 mEq/L   CO2 23 19 - 32 mEq/L   Glucose, Bld 108 (H) 70 - 99 mg/dL   BUN 42 (H) 6 - 23 mg/dL   Creatinine, Ser 7.826.82 (H) 0.50 - 1.35 mg/dL   Calcium 8.0 (L) 8.4 - 10.5 mg/dL   Phosphorus 4.3 2.3 - 4.6 mg/dL   Albumin 3.1 (L) 3.5 - 5.2 g/dL   GFR calc non Af Amer 7 (L) >90 mL/min   GFR calc Af Amer 9 (L) >90 mL/min   Anion gap 18 (H) 5 - 15  Glucose, capillary     Status: Abnormal   Collection Time: 10/16/14  1:08 PM  Result Value Ref Range   Glucose-Capillary 108 (H) 70 - 99 mg/dL  MRSA PCR Screening     Status: None   Collection Time:  10/16/14  6:34 PM  Result Value Ref Range   MRSA by PCR NEGATIVE NEGATIVE  Glucose, capillary     Status: None   Collection Time: 10/16/14  6:44 PM  Result Value Ref Range   Glucose-Capillary 79 70 - 99 mg/dL  Glucose, capillary     Status: Abnormal   Collection Time: 10/16/14  9:02 PM  Result Value Ref Range   Glucose-Capillary 127 (H) 70 - 99 mg/dL  Glucose, capillary     Status: Abnormal   Collection Time: 10/17/14  5:29 AM  Result Value Ref Range   Glucose-Capillary 159 (H) 70 - 99 mg/dL  Glucose, capillary     Status: Abnormal   Collection Time: 10/17/14  7:57 AM  Result Value Ref Range   Glucose-Capillary 142 (H) 70 - 99 mg/dL    Studies/Results: Ct Abdomen Pelvis W Contrast  10/16/2014   CLINICAL DATA:  Rectal bleeding.  EXAM: CT ABDOMEN AND PELVIS WITH CONTRAST  TECHNIQUE: Multidetector CT imaging of the abdomen and pelvis was performed using the standard protocol following bolus administration of intravenous contrast.  CONTRAST:  OMNIPAQUE IOHEXOL 300 MG/ML  SOLN  COMPARISON:  August 21, 2014.  FINDINGS: Mild multilevel degenerative disc disease is noted in the lumbar spine. Visualized lung bases appear normal.  No gallstones are noted. Gallbladder is contracted with mild amount of surrounding fluid which may be due to adjacent hepatic cellular disease. Probable fatty infiltration of the liver. The spleen and pancreas appear normal. Adrenal glands appear normal. Severe bilateral renal atrophy is again noted. The appendix appears normal. There is no evidence of bowel obstruction. Small amount of free fluid is noted in the right pericolic gutter. Minimal the mild wall thickening of the sigmoid colon is noted suggesting some degree of colitis. No small bowel dilatation is noted. Atherosclerotic calcifications of abdominal aorta are noted without aneurysm formation. Urinary bladder is decompressed. Bilateral enlarged inguinal adenopathy is noted which was present on prior exam.  Stable enlarged bilateral iliac adenopathy is also noted.  IMPRESSION: Gallbladder is contracted with no cholelithiasis noted. However, mild amount of fluid is noted around the gallbladder which may be due to adjacent hepatocellular disease. Low density is noted throughout the liver which may represent fatty infiltration or other diffuse hepatocellular disease. There is noted fluid around the liver suggesting hepatocellular disease.  Severely atrophic bilateral kidneys are again noted.  Stable bilateral inguinal and iliac adenopathy is noted compared to prior exam. It is uncertain if this is inflammatory or neoplastic in origin.  Mild wall thickening of the sigmoid colon is noted which may be due to lack of distension, but some degree of colitis cannot be excluded.   Electronically Signed   By: Roque Lias M.D.   On: 10/16/2014 12:13      Assessment: Recurrent rectal bleeding with previous probable ischemic colitis, some residual left colonic edema on current CT.  Plan: Will plan on colonoscopy scheduled for 9:30 tomorrow.    Yamira Papa,Eaden C 10/17/2014, 9:17 AM

## 2014-10-17 NOTE — Progress Notes (Addendum)
Patient Demographics  Nicolas Mccann, is a 68 y.o. male, DOB - Nov 12, 1945, RUE:[id number]  Admit date - 10/16/2014   Admitting Physician Kathlen ModyVijaya Akula, MD  Outpatient Primary MD for the patient is Lora PaulaFUNCHES, JOSALYN C, MD  LOS - 1   Chief Complaint  Patient presents with  . Rectal Bleeding        Subjective:   Nicolas Mccann today has, No headache, No chest pain, No abdominal pain - No Nausea, No new weakness tingling or numbness, No Cough - SOB. No further blood in stool.  Assessment & Plan    1. Blood per rectum. Lower GI bleed of unclear origin, H&H stable, GI following. Due for colonoscopy in the next few days. Continue to monitor H&H.   2.ESRD requiring dialysis. Renal on board. On Monday Wednesday Friday schedule.   3. Chronic systolic heart failure with EF of 35%. Chronic hypotension. Blood pressure too low to tolerate beta blocker or ACE/ARB, on midodrine chronically for low blood pressure, currently compensated from a heart standpoint.   4. DM type II. Currently on sliding scale continued.   Lab Results  Component Value Date   HGBA1C 7.1* 07/24/2014    CBG (last 3)   Recent Labs  10/16/14 2102 10/17/14 0529 10/17/14 0757  GLUCAP 127* 159* 142*     5. Anemia. Comminution of anemia of chronic disease and recent blood loss. Stable no need for transfusion for now.    6. Nonspecific liver findings on CT scan. GI on board. Will have him follow with GI post discharge as well.      Code Status: Full  Family Communication: None  Disposition Plan: Home   Procedures CT abdomen and pelvis, due for colonoscopy   Consults renal, GI   Medications  Scheduled Meds: . allopurinol  100 mg Oral Daily  . amitriptyline  50 mg Oral QHS  . atorvastatin  40 mg Oral q1800  .  doxercalciferol  5 mcg Intravenous Q M,W,F-HD  . insulin aspart  0-9 Units Subcutaneous Q4H  . lanthanum  1,000 mg Oral TID WC  . midodrine  10 mg Oral TID WC  . multivitamin  1 tablet Oral QHS  . pantoprazole (PROTONIX) IV  40 mg Intravenous Q24H   Continuous Infusions:  PRN Meds:.acetaminophen **OR** acetaminophen, camphor-menthol **AND** hydrOXYzine, feeding supplement (NEPRO CARB STEADY), ondansetron **OR** ondansetron (ZOFRAN) IV  DVT Prophylaxis    SCDs    Lab Results  Component Value Date   PLT 175 10/16/2014    Antibiotics     Anti-infectives    None          Objective:   Filed Vitals:   10/16/14 1740 10/16/14 1849 10/16/14 2057 10/17/14 0500  BP: 119/67 96/62 82/56  83/60  Pulse: 94 74 94 99  Temp: 97.4 F (36.3 C) 97.3 F (36.3 C) 98.4 F (36.9 C) 97.9 F (36.6 C)  TempSrc: Oral Oral Oral Oral  Resp: 18  20 18   Height:      Weight: 135.4 kg (298 lb 8.1 oz)  135.9 kg (299 lb 9.7 oz)   SpO2: 95% 96% 100% 98%    Wt Readings from Last 3 Encounters:  10/16/14 135.9 kg (299 lb 9.7 oz)  08/23/14 129.502 kg (285 lb  8 oz)  08/08/14 132.45 kg (292 lb)     Intake/Output Summary (Last 24 hours) at 10/17/14 0856 Last data filed at 10/16/14 1850  Gross per 24 hour  Intake    240 ml  Output      0 ml  Net    240 ml     Physical Exam  Awake Alert, Oriented X 3, No new F.N deficits, Normal affect [place].AT,PERRAL Supple Neck,No JVD, No cervical lymphadenopathy appriciated.  Symmetrical Chest wall movement, Good air movement bilaterally, CTAB RRR,No Gallops,Rubs or new Murmurs, No Parasternal Heave +ve B.Sounds, Abd Soft, No tenderness, No organomegaly appriciated, No rebound - guarding or rigidity. No Cyanosis, Clubbing or edema, No new Rash or bruise     Data Review   Micro Results Recent Results (from the past 240 hour(s))  MRSA PCR Screening     Status: None   Collection Time: 10/16/14  6:34 PM  Result Value Ref Range Status   MRSA by PCR NEGATIVE  NEGATIVE Final    Comment:        The GeneXpert MRSA Assay (FDA approved for NASAL specimens only), is one component of a comprehensive MRSA colonization surveillance program. It is not intended to diagnose MRSA infection nor to guide or monitor treatment for MRSA infections.     Radiology Reports Ct Abdomen Pelvis W Contrast  10/16/2014   CLINICAL DATA:  Rectal bleeding.  EXAM: CT ABDOMEN AND PELVIS WITH CONTRAST  TECHNIQUE: Multidetector CT imaging of the abdomen and pelvis was performed using the standard protocol following bolus administration of intravenous contrast.  CONTRAST:  OMNIPAQUE IOHEXOL 300 MG/ML  SOLN  COMPARISON:  August 21, 2014.  FINDINGS: Mild multilevel degenerative disc disease is noted in the lumbar spine. Visualized lung bases appear normal.  No gallstones are noted. Gallbladder is contracted with mild amount of surrounding fluid which may be due to adjacent hepatic cellular disease. Probable fatty infiltration of the liver. The spleen and pancreas appear normal. Adrenal glands appear normal. Severe bilateral renal atrophy is again noted. The appendix appears normal. There is no evidence of bowel obstruction. Small amount of free fluid is noted in the right pericolic gutter. Minimal the mild wall thickening of the sigmoid colon is noted suggesting some degree of colitis. No small bowel dilatation is noted. Atherosclerotic calcifications of abdominal aorta are noted without aneurysm formation. Urinary bladder is decompressed. Bilateral enlarged inguinal adenopathy is noted which was present on prior exam. Stable enlarged bilateral iliac adenopathy is also noted.  IMPRESSION: Gallbladder is contracted with no cholelithiasis noted. However, mild amount of fluid is noted around the gallbladder which may be due to adjacent hepatocellular disease. Low density is noted throughout the liver which may represent fatty infiltration or other diffuse hepatocellular disease. There is  noted fluid around the liver suggesting hepatocellular disease.  Severely atrophic bilateral kidneys are again noted.  Stable bilateral inguinal and iliac adenopathy is noted compared to prior exam. It is uncertain if this is inflammatory or neoplastic in origin.  Mild wall thickening of the sigmoid colon is noted which may be due to lack of distension, but some degree of colitis cannot be excluded.   Electronically Signed   By: Roque Lias M.D.   On: 10/16/2014 12:13     CBC  Recent Labs Lab 10/16/14 0644 10/16/14 1137  WBC 8.1 7.2  HGB 10.9* 10.6*  HCT 33.0* 31.5*  PLT 186 175  MCV 107.5* 108.6*  MCH 35.5* 36.6*  MCHC 33.0 33.7  RDW 17.0* 16.8*    Chemistries   Recent Labs Lab 10/16/14 0644 10/16/14 1137  NA 140 137  K 3.9 3.9  CL 97 96  CO2 26 23  GLUCOSE 143* 108*  BUN 40* 42*  CREATININE 6.48* 6.82*  CALCIUM 8.3* 8.0*  AST 24  --   ALT 19  --   ALKPHOS 186*  --   BILITOT 1.4*  --    ------------------------------------------------------------------------------------------------------------------ estimated creatinine clearance is 15.3 mL/min (by C-G formula based on Cr of 6.82). ------------------------------------------------------------------------------------------------------------------ No results for input(s): HGBA1C in the last 72 hours. ------------------------------------------------------------------------------------------------------------------ No results for input(s): CHOL, HDL, LDLCALC, TRIG, CHOLHDL, LDLDIRECT in the last 72 hours. ------------------------------------------------------------------------------------------------------------------ No results for input(s): TSH, T4TOTAL, T3FREE, THYROIDAB in the last 72 hours.  Invalid input(s): FREET3 ------------------------------------------------------------------------------------------------------------------ No results for input(s): VITAMINB12, FOLATE, FERRITIN, TIBC, IRON, RETICCTPCT in the  last 72 hours.  Coagulation profile  Recent Labs Lab 10/16/14 0644  INR 1.32    No results for input(s): DDIMER in the last 72 hours.  Cardiac Enzymes No results for input(s): CKMB, TROPONINI, MYOGLOBIN in the last 168 hours.  Invalid input(s): CK ------------------------------------------------------------------------------------------------------------------ Invalid input(s): POCBNP     Time Spent in minutes   35   SINGH,PRASHANT K M.D on 10/17/2014 at 8:56 AM  Between 7am to 7pm - Pager - [phone]  After 7pm go to [email or web address] - password [facility]  [facility] Office  [phone]

## 2014-10-17 NOTE — Progress Notes (Signed)
Subjective:  Pruritic rash on arms; otherwise, no complaints  Objective: Vital signs in last 24 hours: Temp:  [97.3 F (36.3 C)-98.4 F (36.9 C)] 97.9 F (36.6 C) (12/10 0500) Pulse Rate:  [73-141] 99 (12/10 0500) Resp:  [15-29] 18 (12/10 0500) BP: (82-123)/(56-97) 83/60 mmHg (12/10 0500) SpO2:  [95 %-100 %] 98 % (12/10 0500) Weight:  [135.4 kg (298 lb 8.1 oz)-135.9 kg (299 lb 9.7 oz)] 135.9 kg (299 lb 9.7 oz) (12/09 2057) Weight change: 5.264 kg (11 lb 9.7 oz)  Intake/Output from previous day: 12/09 0701 - 12/10 0700 In: 240 [P.O.:240] Out: 0  Intake/Output this shift:   Lab Results:  Recent Labs  10/16/14 0644 10/16/14 1137  WBC 8.1 7.2  HGB 10.9* 10.6*  HCT 33.0* 31.5*  PLT 186 175   BMET:  Recent Labs  10/16/14 0644 10/16/14 1137  NA 140 137  K 3.9 3.9  CL 97 96  CO2 26 23  GLUCOSE 143* 108*  BUN 40* 42*  CREATININE 6.48* 6.82*  CALCIUM 8.3* 8.0*  ALBUMIN 3.2* 3.1*   No results for input(s): PTH in the last 72 hours. Iron Studies: No results for input(s): IRON, TIBC, TRANSFERRIN, FERRITIN in the last 72 hours.  Studies/Results: Ct Abdomen Pelvis W Contrast  10/16/2014   CLINICAL DATA:  Rectal bleeding.  EXAM: CT ABDOMEN AND PELVIS WITH CONTRAST  TECHNIQUE: Multidetector CT imaging of the abdomen and pelvis was performed using the standard protocol following bolus administration of intravenous contrast.  CONTRAST:  100mL OMNIPAQUE IOHEXOL 300 MG/ML  SOLN  COMPARISON:  August 21, 2014.  FINDINGS: Mild multilevel degenerative disc disease is noted in the lumbar spine. Visualized lung bases appear normal.  No gallstones are noted. Gallbladder is contracted with mild amount of surrounding fluid which may be due to adjacent hepatic cellular disease. Probable fatty infiltration of the liver. The spleen and pancreas appear normal. Adrenal glands appear normal. Severe bilateral renal atrophy is again noted. The appendix appears normal. There is no evidence of bowel  obstruction. Small amount of free fluid is noted in the right pericolic gutter. Minimal the mild wall thickening of the sigmoid colon is noted suggesting some degree of colitis. No small bowel dilatation is noted. Atherosclerotic calcifications of abdominal aorta are noted without aneurysm formation. Urinary bladder is decompressed. Bilateral enlarged inguinal adenopathy is noted which was present on prior exam. Stable enlarged bilateral iliac adenopathy is also noted.  IMPRESSION: Gallbladder is contracted with no cholelithiasis noted. However, mild amount of fluid is noted around the gallbladder which may be due to adjacent hepatocellular disease. Low density is noted throughout the liver which may represent fatty infiltration or other diffuse hepatocellular disease. There is noted fluid around the liver suggesting hepatocellular disease.  Severely atrophic bilateral kidneys are again noted.  Stable bilateral inguinal and iliac adenopathy is noted compared to prior exam. It is uncertain if this is inflammatory or neoplastic in origin.  Mild wall thickening of the sigmoid colon is noted which may be due to lack of distension, but some degree of colitis cannot be excluded.   Electronically Signed   By: Roque LiasJames  Green M.D.   On: 10/16/2014 12:13    EXAM: General appearance:  Alert, in no apparent distress Resp:  CTA without rales, rhonchi, or wheezes Cardio:  RRR without murmur or rub GI:  + BS, soft and nontender Extremities:  Trace edema Access:  AVF @ [facility] with faint bruit  [facility]15 130.5 kg 2K/2Ca  450/800 Profile 4 No heparin AVF @ [facility] Hectorol 5 mcg No Aranesp Venofer 100 x 10 (through 12/23)  Assessment/Plan: 1. Rectal bleeding - previous episode in Oct, treated for ischemic colitis with Cipro & Flagyl, missed follow-up with Dr. Matthias Hughs 11/24 to arrange colonoscopy; CT yesterday showed mild wall thickening of sigmoid colon,  suggesting possible colitis. Colonoscopy per GI pending.  2. ESRD - HD on MWF @ [facility], K 3.9. HD pending. 3. HTN/volume - BP 83/60 with symptoms of orthostatic hypotension, no meds; wt 135.9 kg s/p no fluid removal with HD yesterday. 4. Anemia - Hgb 10.6, no Aranesp, Venofer 100 mg x 10.  5. Metabolic bone disease - Ca 8 (8.7 corrected), P 4.3, iPTH 1115; Hectorol 5 mcg, Fosrenol 1 g with meals. 6. Nutrition - Alb 3.1, renal diet, vitamin. 7. DM - per primary    LOS: 1 day   LYLES,CHARLES 10/17/2014,7:22 AM   Renal Attending: I agree with the note above as articulated with no corrections or additions needed. Bradely Rudin C

## 2014-10-18 ENCOUNTER — Inpatient Hospital Stay (HOSPITAL_COMMUNITY): Payer: Medicare Other | Admitting: Anesthesiology

## 2014-10-18 ENCOUNTER — Encounter (HOSPITAL_COMMUNITY): Payer: Self-pay | Admitting: *Deleted

## 2014-10-18 ENCOUNTER — Encounter (HOSPITAL_COMMUNITY)
Admission: EM | Disposition: A | Discharge status: Home Health | Payer: Self-pay | Source: Home / Self Care | Attending: Internal Medicine

## 2014-10-18 HISTORY — PX: COLONOSCOPY WITH PROPOFOL: SHX5780

## 2014-10-18 LAB — RENAL FUNCTION PANEL
Albumin: 3.1 g/dL — ABNORMAL LOW (ref 3.5–5.2)
Anion gap: 18 — ABNORMAL HIGH (ref 5–15)
BUN: 32 mg/dL — ABNORMAL HIGH (ref 6–23)
CHLORIDE: 97 meq/L (ref 96–112)
CO2: 22 meq/L (ref 19–32)
CREATININE: 5.79 mg/dL — AB (ref 0.50–1.35)
Calcium: 8.5 mg/dL (ref 8.4–10.5)
GFR, EST AFRICAN AMERICAN: 10 mL/min — AB (ref >90)
GFR, EST NON AFRICAN AMERICAN: 9 mL/min — AB (ref >90)
Glucose, Bld: 174 mg/dL — ABNORMAL HIGH (ref 70–99)
Phosphorus: 4.5 mg/dL (ref 2.3–4.6)
Potassium: 5.4 mEq/L — ABNORMAL HIGH (ref 3.7–5.3)
SODIUM: 137 meq/L (ref 137–147)

## 2014-10-18 LAB — GLUCOSE, CAPILLARY
GLUCOSE-CAPILLARY: 105 mg/dL — AB (ref 70–99)
GLUCOSE-CAPILLARY: 111 mg/dL — AB (ref 70–99)
GLUCOSE-CAPILLARY: 115 mg/dL — AB (ref 70–99)
GLUCOSE-CAPILLARY: 129 mg/dL — AB (ref 70–99)
Glucose-Capillary: 116 mg/dL — ABNORMAL HIGH (ref 70–99)

## 2014-10-18 LAB — CBC
HCT: 31.9 % — ABNORMAL LOW (ref 39.0–52.0)
HEMOGLOBIN: 10.5 g/dL — AB (ref 13.0–17.0)
MCH: 35.5 pg — AB (ref 26.0–34.0)
MCHC: 32.9 g/dL (ref 30.0–36.0)
MCV: 107.8 fL — AB (ref 78.0–100.0)
Platelets: 190 10*3/uL (ref 150–400)
RBC: 2.96 MIL/uL — AB (ref 4.22–5.81)
RDW: 16.8 % — ABNORMAL HIGH (ref 11.5–15.5)
WBC: 6.8 10*3/uL (ref 4.0–10.5)

## 2014-10-18 SURGERY — COLONOSCOPY WITH PROPOFOL
Anesthesia: Monitor Anesthesia Care

## 2014-10-18 MED ORDER — INSULIN ASPART 100 UNIT/ML ~~LOC~~ SOLN: 0.0000 [IU] | Freq: Every day | SUBCUTANEOUS | Status: DC

## 2014-10-18 MED ORDER — FENTANYL CITRATE 0.05 MG/ML IJ SOLN: INTRAMUSCULAR | Status: DC | PRN

## 2014-10-18 MED ORDER — LIDOCAINE HCL (PF) 1 % IJ SOLN: 5.0000 mL | INTRAMUSCULAR | Status: DC | PRN

## 2014-10-18 MED ORDER — LIDOCAINE-PRILOCAINE 2.5-2.5 % EX CREA: 1.0000 "application " | TOPICAL_CREAM | CUTANEOUS | Status: DC | PRN

## 2014-10-18 MED ORDER — INSULIN ASPART 100 UNIT/ML ~~LOC~~ SOLN
0.0000 [IU] | Freq: Three times a day (TID) | SUBCUTANEOUS | Status: DC
  Administered 2014-10-18 – 2014-10-19 (×3): 1 [IU] via SUBCUTANEOUS

## 2014-10-18 MED ORDER — DOXERCALCIFEROL 4 MCG/2ML IV SOLN
INTRAVENOUS | Status: AC
  Administered 2014-10-18: 5 ug via INTRAVENOUS
  Filled 2014-10-18: qty 4

## 2014-10-18 MED ORDER — SODIUM CHLORIDE 0.9 % IV SOLN: 100.0000 mL | INTRAVENOUS | Status: DC | PRN

## 2014-10-18 MED ORDER — SODIUM CHLORIDE 0.9 % IV SOLN
INTRAVENOUS | Status: DC | PRN
  Administered 2014-10-18: 09:00:00 via INTRAVENOUS

## 2014-10-18 MED ORDER — NEPRO/CARBSTEADY PO LIQD: 237.0000 mL | ORAL | Status: DC | PRN

## 2014-10-18 MED ORDER — ALTEPLASE 2 MG IJ SOLR
2.0000 mg | Freq: Once | INTRAMUSCULAR | Status: AC | PRN
  Filled 2014-10-18: qty 2

## 2014-10-18 MED ORDER — SODIUM CHLORIDE 0.9 % IV SOLN
100.0000 mL | INTRAVENOUS | Status: DC | PRN
  Administered 2014-10-18 – 2014-10-19 (×2): 100 mL via INTRAVENOUS

## 2014-10-18 MED ORDER — PROPOFOL INFUSION 10 MG/ML OPTIME
INTRAVENOUS | Status: DC | PRN
  Administered 2014-10-18: 75 ug/kg/min via INTRAVENOUS

## 2014-10-18 MED ORDER — PENTAFLUOROPROP-TETRAFLUOROETH EX AERO
1.0000 | INHALATION_SPRAY | CUTANEOUS | Status: DC | PRN
Start: 2014-10-18 — End: 2014-10-19

## 2014-10-18 MED ORDER — HEPARIN SODIUM (PORCINE) 1000 UNIT/ML DIALYSIS: 1000.0000 [IU] | INTRAMUSCULAR | Status: DC | PRN

## 2014-10-18 NOTE — Interval H&P Note (Signed)
History and Physical Interval Note:  10/18/2014 9:20 AM  Nicolas Mccann  has presented today for surgery, with the diagnosis of rectal bleeding  The various methods of treatment have been discussed with the patient and family. After consideration of risks, benefits and other options for treatment, the patient has consented to  Procedure(s): COLONOSCOPY WITH PROPOFOL (N/A) as a surgical intervention .  The patient's history has been reviewed, patient examined, no change in status, stable for surgery.  I have reviewed the patient's chart and labs.  Questions were answered to the patient's satisfaction.     Charlee Whitebread,Steele C

## 2014-10-18 NOTE — Progress Notes (Signed)
PT Cancellation Note  Patient Details Name: Nicolas Mccann MRN: [id number] DOB: 03-13-1946   Cancelled Treatment:    Reason Eval/Treat Not Completed: Patient at procedure or test/unavailable. Pt off unit for colonoscopy this morning, and is now in HD. Will continue to follow and progress as able per POC.     Conni Slipper 10/18/2014, 1:44 PM   Conni Slipper, PT, DPT Acute Rehabilitation Services Pager: [phone]

## 2014-10-18 NOTE — Plan of Care (Signed)
Problem: Phase I Progression Outcomes Goal: OOB as tolerated unless otherwise ordered Outcome: Completed/Met Date Met:  10/18/14 Up to [facility].

## 2014-10-18 NOTE — Procedures (Signed)
He is receiving and tolerating hemodialysis.  No acute interventions needed so far. Nicolas Mccann

## 2014-10-18 NOTE — Anesthesia Preprocedure Evaluation (Signed)
Anesthesia Evaluation  Patient identified by MRN, date of birth, ID band Patient awake    Reviewed: Allergy & Precautions, H&P , NPO status , Patient's Chart, lab work & pertinent test results  Airway Mallampati: II       Dental   Pulmonary shortness of breath, former smoker,  breath sounds clear to auscultation        Cardiovascular hypertension, Rhythm:Regular Rate:Normal     Neuro/Psych    GI/Hepatic   Endo/Other  diabetesMorbid obesity  Renal/GU ESRFRenal disease     Musculoskeletal   Abdominal   Peds  Hematology  (+) anemia ,   Anesthesia Other Findings   Reproductive/Obstetrics                             Anesthesia Physical Anesthesia Plan  ASA: III  Anesthesia Plan: MAC   Post-op Pain Management:    Induction:   Airway Management Planned: Natural Airway  Additional Equipment:   Intra-op Plan:   Post-operative Plan:   Informed Consent: I have reviewed the patients History and Physical, chart, labs and discussed the procedure including the risks, benefits and alternatives for the proposed anesthesia with the patient or authorized representative who has indicated his/her understanding and acceptance.     Plan Discussed with: CRNA  Anesthesia Plan Comments:         Anesthesia Quick Evaluation

## 2014-10-18 NOTE — H&P (View-Only) (Signed)
[facility] Progress Note  Subjective: No new complaints, no rectal bleeding or abdominal pain  Objective: Vital signs in last 24 hours: Temp:  [97.3 F (36.3 C)-98.4 F (36.9 C)] 97.9 F (36.6 C) (12/10 0909) Pulse Rate:  [73-99] 86 (12/10 0909) Resp:  [16-20] 19 (12/10 0909) BP: (82-123)/(56-73) 85/56 mmHg (12/10 0909) SpO2:  [95 %-100 %] 99 % (12/10 0909) Weight:  [135.4 kg (298 lb 8.1 oz)-135.9 kg (299 lb 9.7 oz)] 135.9 kg (299 lb 9.7 oz) (12/09 2057) Weight change: 5.264 kg (11 lb 9.7 oz)   PE: Unchanged  Lab Results: Results for orders placed or performed during the hospital encounter of 10/16/14 (from the past 24 hour(s))  CBG monitoring, ED     Status: Abnormal   Collection Time: 10/16/14  9:49 AM  Result Value Ref Range   Glucose-Capillary 133 (H) 70 - 99 mg/dL  CBC     Status: Abnormal   Collection Time: 10/16/14 11:37 AM  Result Value Ref Range   WBC 7.2 4.0 - 10.5 K/uL   RBC 2.90 (L) 4.22 - 5.81 MIL/uL   Hemoglobin 10.6 (L) 13.0 - 17.0 g/dL   HCT 31.5 (L) 39.0 - 52.0 %   MCV 108.6 (H) 78.0 - 100.0 fL   MCH 36.6 (H) 26.0 - 34.0 pg   MCHC 33.7 30.0 - 36.0 g/dL   RDW 16.8 (H) 11.5 - 15.5 %   Platelets 175 150 - 400 K/uL  Renal function panel     Status: Abnormal   Collection Time: 10/16/14 11:37 AM  Result Value Ref Range   Sodium 137 137 - 147 mEq/L   Potassium 3.9 3.7 - 5.3 mEq/L   Chloride 96 96 - 112 mEq/L   CO2 23 19 - 32 mEq/L   Glucose, Bld 108 (H) 70 - 99 mg/dL   BUN 42 (H) 6 - 23 mg/dL   Creatinine, Ser 6.82 (H) 0.50 - 1.35 mg/dL   Calcium 8.0 (L) 8.4 - 10.5 mg/dL   Phosphorus 4.3 2.3 - 4.6 mg/dL   Albumin 3.1 (L) 3.5 - 5.2 g/dL   GFR calc non Af Amer 7 (L) >90 mL/min   GFR calc Af Amer 9 (L) >90 mL/min   Anion gap 18 (H) 5 - 15  Glucose, capillary     Status: Abnormal   Collection Time: 10/16/14  1:08 PM  Result Value Ref Range   Glucose-Capillary 108 (H) 70 - 99 mg/dL  MRSA PCR Screening     Status: None   Collection Time:  10/16/14  6:34 PM  Result Value Ref Range   MRSA by PCR NEGATIVE NEGATIVE  Glucose, capillary     Status: None   Collection Time: 10/16/14  6:44 PM  Result Value Ref Range   Glucose-Capillary 79 70 - 99 mg/dL  Glucose, capillary     Status: Abnormal   Collection Time: 10/16/14  9:02 PM  Result Value Ref Range   Glucose-Capillary 127 (H) 70 - 99 mg/dL  Glucose, capillary     Status: Abnormal   Collection Time: 10/17/14  5:29 AM  Result Value Ref Range   Glucose-Capillary 159 (H) 70 - 99 mg/dL  Glucose, capillary     Status: Abnormal   Collection Time: 10/17/14  7:57 AM  Result Value Ref Range   Glucose-Capillary 142 (H) 70 - 99 mg/dL    Studies/Results: Ct Abdomen Pelvis W Contrast  10/16/2014   CLINICAL DATA:  Rectal bleeding.  EXAM: CT ABDOMEN AND PELVIS WITH CONTRAST    TECHNIQUE: Multidetector CT imaging of the abdomen and pelvis was performed using the standard protocol following bolus administration of intravenous contrast.  CONTRAST:  OMNIPAQUE IOHEXOL 300 MG/ML  SOLN  COMPARISON:  August 21, 2014.  FINDINGS: Mild multilevel degenerative disc disease is noted in the lumbar spine. Visualized lung bases appear normal.  No gallstones are noted. Gallbladder is contracted with mild amount of surrounding fluid which may be due to adjacent hepatic cellular disease. Probable fatty infiltration of the liver. The spleen and pancreas appear normal. Adrenal glands appear normal. Severe bilateral renal atrophy is again noted. The appendix appears normal. There is no evidence of bowel obstruction. Small amount of free fluid is noted in the right pericolic gutter. Minimal the mild wall thickening of the sigmoid colon is noted suggesting some degree of colitis. No small bowel dilatation is noted. Atherosclerotic calcifications of abdominal aorta are noted without aneurysm formation. Urinary bladder is decompressed. Bilateral enlarged inguinal adenopathy is noted which was present on prior exam.  Stable enlarged bilateral iliac adenopathy is also noted.  IMPRESSION: Gallbladder is contracted with no cholelithiasis noted. However, mild amount of fluid is noted around the gallbladder which may be due to adjacent hepatocellular disease. Low density is noted throughout the liver which may represent fatty infiltration or other diffuse hepatocellular disease. There is noted fluid around the liver suggesting hepatocellular disease.  Severely atrophic bilateral kidneys are again noted.  Stable bilateral inguinal and iliac adenopathy is noted compared to prior exam. It is uncertain if this is inflammatory or neoplastic in origin.  Mild wall thickening of the sigmoid colon is noted which may be due to lack of distension, but some degree of colitis cannot be excluded.   Electronically Signed   By: Roque Lias M.D.   On: 10/16/2014 12:13      Assessment: Recurrent rectal bleeding with previous probable ischemic colitis, some residual left colonic edema on current CT.  Plan: Will plan on colonoscopy scheduled for 9:30 tomorrow.    Lalena Salas,Eaden C 10/17/2014, 9:17 AM

## 2014-10-18 NOTE — Progress Notes (Signed)
Subjective:   Feeling well post colonoscopy. No complaints- wants to go home  Objective Filed Vitals:   10/18/14 1020 10/18/14 1025 10/18/14 1030 10/18/14 1059  BP: 73/34 71/41 109/50 94/70  Pulse: 93 94 93 90  Temp:    97.5 F (36.4 C)  TempSrc:    Oral  Resp: 22 19 24 20   Height:      Weight:      SpO2: 99% 98% 95% 100%   Physical Exam General: alert and oriented. No acute distress.  Heart: RRR  Lungs: CTA, unlabored.  Abdomen: soft, nontender +BS  Extremities: +1 LE edema  Dialysis Access: L aVF patent on HD  Dialysis Orders: MWF @ [facility] 4:15 130.5 kg 2K/2Ca 450/800 Profile 4 No heparin AVF @ [facility] Hectorol 5 mcg No Aranesp Venofer 100 x 10 (through 12/23)  Assessment/Plan: 1. Rectal bleeding - previous episode in Oct, treated for ischemic colitis with Cipro & Flagyl, missed follow-up with Dr. Matthias HughsBuccini 11/24 to arrange colonoscopy; CT yesterday showed mild wall thickening of sigmoid colon, suggesting possible colitis. Colonoscopy this AM- showed diverticulosis, minimal edema in the left colon. No signs of bleeding 2. ESRD - HD on MWF @ [facility], K 3.9. HD today. 3. HTN/volume - BP 94/70 with symptoms of orthostatic hypotension, no meds; wt 136.5 kg- with LE edema. Midodrine started 12/10 4. Anemia - Hgb 10.6, no Aranesp, Venofer 100 mg x 10.  5. Metabolic bone disease - Ca 8 (8.7 corrected), P 4.3, iPTH 1115; Hectorol 5 mcg, Fosrenol 1 g with meals. 6. Nutrition - Alb 3.1, renal diet, vitamin. 7. DM - per primary 8. dispo- possible DC tomorrow if hgb stable  Jetty DuhamelBridget Whelan, NP [facility] [phone] 10/18/2014,1:02 PM  LOS: 2 days   Renal Attending  As above. Karlos Scadden C   Additional Objective Labs: Basic Metabolic Panel:  Recent Labs Lab 10/16/14 0644 10/16/14 1137  NA 140 137  K 3.9 3.9  CL 97 96  CO2 26 23  GLUCOSE 143* 108*  BUN 40* 42*  CREATININE 6.48* 6.82*  CALCIUM 8.3* 8.0*  PHOS  --   4.3   Liver Function Tests:  Recent Labs Lab 10/16/14 0644 10/16/14 1137  AST 24  --   ALT 19  --   ALKPHOS 186*  --   BILITOT 1.4*  --   PROT 8.2  --   ALBUMIN 3.2* 3.1*   No results for input(s): LIPASE, AMYLASE in the last 168 hours. CBC:  Recent Labs Lab 10/16/14 0644 10/16/14 1137  WBC 8.1 7.2  HGB 10.9* 10.6*  HCT 33.0* 31.5*  MCV 107.5* 108.6*  PLT 186 175   Blood Culture    Component Value Date/Time   SDES BLOOD HAND RIGHT 08/21/2014 1519   SPECREQUEST BOTTLES DRAWN AEROBIC AND ANAEROBIC 5CC 08/21/2014 1519   CULT  08/21/2014 1519    NO GROWTH 5 DAYS [facility]formed at [facility]   REPTSTATUS 08/27/2014 FINAL 08/21/2014 1519    Cardiac Enzymes: No results for input(s): CKTOTAL, CKMB, CKMBINDEX, TROPONINI in the last 168 hours. CBG:  Recent Labs Lab 10/17/14 1959 10/18/14 0006 10/18/14 0419 10/18/14 0736 10/18/14 1128  GLUCAP 111* 116* 105* 111* 115*   Iron Studies: No results for input(s): IRON, TIBC, TRANSFERRIN, FERRITIN in the last 72 hours. @lablastinr3 @ Studies/Results: No results found. Medications: . sodium chloride 10 mL/hr at 10/17/14 0930   . allopurinol  100 mg Oral Daily  . amitriptyline  50 mg Oral QHS  . atorvastatin  40  mg Oral q1800  . doxercalciferol  5 mcg Intravenous Q M,W,F-HD  . insulin aspart  0-5 Units Subcutaneous QHS  . insulin aspart  0-9 Units Subcutaneous TID WC  . lanthanum  1,000 mg Oral TID WC  . midodrine  10 mg Oral TID WC  . multivitamin  1 tablet Oral QHS  . pantoprazole (PROTONIX) IV  40 mg Intravenous Q24H

## 2014-10-18 NOTE — Anesthesia Postprocedure Evaluation (Signed)
  Anesthesia Post-op Note  Patient: Nicolas Mccann  Procedure(s) Performed: Procedure(s): COLONOSCOPY WITH PROPOFOL (N/A)  Patient Location: Endoscopy Unit  Anesthesia Type:MAC  Level of Consciousness: awake and alert   Airway and Oxygen Therapy: Patient Spontanous Breathing  Post-op Pain: none  Post-op Assessment: Post-op Vital signs reviewed  Post-op Vital Signs: stable  Last Vitals:  Filed Vitals:   10/18/14 1030  BP: 109/50  Pulse: 93  Temp:   Resp: 24    Complications: No apparent anesthesia complications

## 2014-10-18 NOTE — Progress Notes (Signed)
Patient Demographics  Nicolas Mccann, is a 68 y.o. male, DOB - 18-Feb-1946, ZOX:[id number]  Admit date - 10/16/2014   Admitting Physician Kathlen Mody, MD  Outpatient Primary MD for the patient is Lora Paula, MD  LOS - 2   Chief Complaint  Patient presents with  . Rectal Bleeding        Subjective:   Nicolas Mccann today has, No headache, No chest pain, No abdominal pain - No Nausea, No new weakness tingling or numbness, No Cough - SOB. No further blood in stool.  Assessment & Plan    1. Blood per rectum. Lower GI bleed of unclear origin, H&H stable, GI following. H&H stable. Colonoscopy showing mild left-sided edema with a few diverticuli, no active bleed. Bleeding likely due to previous colitis or diverticular. If H&H stable in the morning with discharge.   2.ESRD requiring dialysis. Renal on board. On Monday Wednesday Friday schedule.   3. Chronic systolic heart failure with EF of 35%. Chronic hypotension. Blood pressure too low to tolerate beta blocker or ACE/ARB, on midodrine chronically for low blood pressure, currently compensated from a heart standpoint.   4. DM type II. Currently on sliding scale continued.   Lab Results  Component Value Date   HGBA1C 6.9* 10/17/2014    CBG (last 3)   Recent Labs  10/18/14 0006 10/18/14 0419 10/18/14 0736  GLUCAP 116* 105* 111*     5. Anemia. Comminution of anemia of chronic disease and recent blood loss. Stable no need for transfusion for now.    6. Nonspecific liver findings on CT scan. GI on board. Will have him follow with GI post discharge as well.      Code Status: Full  Family Communication: None  Disposition Plan: Home   Procedures CT abdomen and pelvis,  Colonoscopy showing mild left-sided edema with a few  diverticuli, no active bleed. Bleeding likely due to previous colitis or diverticular.   Consults renal, GI   Medications  Scheduled Meds: . [MAR Hold] allopurinol  100 mg Oral Daily  . [MAR Hold] amitriptyline  50 mg Oral QHS  . [MAR Hold] atorvastatin  40 mg Oral q1800  . [MAR Hold] doxercalciferol  5 mcg Intravenous Q M,W,F-HD  . [MAR Hold] insulin aspart  0-9 Units Subcutaneous Q4H  . [MAR Hold] lanthanum  1,000 mg Oral TID WC  . [MAR Hold] midodrine  10 mg Oral TID WC  . [MAR Hold] multivitamin  1 tablet Oral QHS  . [MAR Hold] pantoprazole (PROTONIX) IV  40 mg Intravenous Q24H   Continuous Infusions: . sodium chloride 10 mL/hr at 10/17/14 0930   PRN Meds:.[MAR Hold] sodium chloride, [MAR Hold] sodium chloride, [MAR Hold] acetaminophen **OR** [MAR Hold] acetaminophen, [MAR Hold] alteplase, [MAR Hold] camphor-menthol **AND** [MAR Hold] hydrOXYzine, [MAR Hold] feeding supplement (NEPRO CARB STEADY), [MAR Hold] feeding supplement (NEPRO CARB STEADY), [MAR Hold] heparin, [MAR Hold] lidocaine (PF), [MAR Hold] lidocaine-prilocaine, [MAR Hold] ondansetron **OR** [MAR Hold] ondansetron (ZOFRAN) IV [MAR Hold] pentafluoroprop-tetrafluoroeth  DVT Prophylaxis    SCDs    Lab Results  Component Value Date   PLT 175 10/16/2014    Antibiotics     Anti-infectives    None  Objective:   Filed Vitals:   10/18/14 1005 10/18/14 1010 10/18/14 1015 10/18/14 1020  BP: 86/37  67/21 73/34  Pulse: 94 95 94   Temp:      TempSrc:      Resp: 18 19 19    Height:      Weight:      SpO2: 97% 97% 96%     Wt Readings from Last 3 Encounters:  10/16/14 135.9 kg (299 lb 9.7 oz)  08/23/14 129.502 kg (285 lb 8 oz)  08/08/14 132.45 kg (292 lb)     Intake/Output Summary (Last 24 hours) at 10/18/14 1025 Last data filed at 10/18/14 0953  Gross per 24 hour  Intake    900 ml  Output      0 ml  Net    900 ml     Physical Exam  Awake Alert, Oriented X 3, No new F.N deficits,  Normal affect [place].AT,PERRAL Supple Neck,No JVD, No cervical lymphadenopathy appriciated.  Symmetrical Chest wall movement, Good air movement bilaterally, CTAB RRR,No Gallops,Rubs or new Murmurs, No Parasternal Heave +ve B.Sounds, Abd Soft, No tenderness, No organomegaly appriciated, No rebound - guarding or rigidity. No Cyanosis, Clubbing or edema, No new Rash or bruise     Data Review   Micro Results Recent Results (from the past 240 hour(s))  MRSA PCR Screening     Status: None   Collection Time: 10/16/14  6:34 PM  Result Value Ref Range Status   MRSA by PCR NEGATIVE NEGATIVE Final    Comment:        The GeneXpert MRSA Assay (FDA approved for NASAL specimens only), is one component of a comprehensive MRSA colonization surveillance program. It is not intended to diagnose MRSA infection nor to guide or monitor treatment for MRSA infections.     Radiology Reports Ct Abdomen Pelvis W Contrast  10/16/2014   CLINICAL DATA:  Rectal bleeding.  EXAM: CT ABDOMEN AND PELVIS WITH CONTRAST  TECHNIQUE: Multidetector CT imaging of the abdomen and pelvis was performed using the standard protocol following bolus administration of intravenous contrast.  CONTRAST:  OMNIPAQUE IOHEXOL 300 MG/ML  SOLN  COMPARISON:  August 21, 2014.  FINDINGS: Mild multilevel degenerative disc disease is noted in the lumbar spine. Visualized lung bases appear normal.  No gallstones are noted. Gallbladder is contracted with mild amount of surrounding fluid which may be due to adjacent hepatic cellular disease. Probable fatty infiltration of the liver. The spleen and pancreas appear normal. Adrenal glands appear normal. Severe bilateral renal atrophy is again noted. The appendix appears normal. There is no evidence of bowel obstruction. Small amount of free fluid is noted in the right pericolic gutter. Minimal the mild wall thickening of the sigmoid colon is noted suggesting some degree of colitis. No small bowel  dilatation is noted. Atherosclerotic calcifications of abdominal aorta are noted without aneurysm formation. Urinary bladder is decompressed. Bilateral enlarged inguinal adenopathy is noted which was present on prior exam. Stable enlarged bilateral iliac adenopathy is also noted.  IMPRESSION: Gallbladder is contracted with no cholelithiasis noted. However, mild amount of fluid is noted around the gallbladder which may be due to adjacent hepatocellular disease. Low density is noted throughout the liver which may represent fatty infiltration or other diffuse hepatocellular disease. There is noted fluid around the liver suggesting hepatocellular disease.  Severely atrophic bilateral kidneys are again noted.  Stable bilateral inguinal and iliac adenopathy is noted compared to prior exam. It is uncertain if this is inflammatory or  neoplastic in origin.  Mild wall thickening of the sigmoid colon is noted which may be due to lack of distension, but some degree of colitis cannot be excluded.   Electronically Signed   By: Roque LiasJames  Green M.D.   On: 10/16/2014 12:13     CBC  Recent Labs Lab 10/16/14 0644 10/16/14 1137  WBC 8.1 7.2  HGB 10.9* 10.6*  HCT 33.0* 31.5*  PLT 186 175  MCV 107.5* 108.6*  MCH 35.5* 36.6*  MCHC 33.0 33.7  RDW 17.0* 16.8*    Chemistries   Recent Labs Lab 10/16/14 0644 10/16/14 1137  NA 140 137  K 3.9 3.9  CL 97 96  CO2 26 23  GLUCOSE 143* 108*  BUN 40* 42*  CREATININE 6.48* 6.82*  CALCIUM 8.3* 8.0*  AST 24  --   ALT 19  --   ALKPHOS 186*  --   BILITOT 1.4*  --    ------------------------------------------------------------------------------------------------------------------ estimated creatinine clearance is 15.3 mL/min (by C-G formula based on Cr of 6.82). ------------------------------------------------------------------------------------------------------------------  Recent Labs  10/17/14 0453  HGBA1C 6.9*    ------------------------------------------------------------------------------------------------------------------ No results for input(s): CHOL, HDL, LDLCALC, TRIG, CHOLHDL, LDLDIRECT in the last 72 hours. ------------------------------------------------------------------------------------------------------------------ No results for input(s): TSH, T4TOTAL, T3FREE, THYROIDAB in the last 72 hours.  Invalid input(s): FREET3 ------------------------------------------------------------------------------------------------------------------ No results for input(s): VITAMINB12, FOLATE, FERRITIN, TIBC, IRON, RETICCTPCT in the last 72 hours.  Coagulation profile  Recent Labs Lab 10/16/14 0644  INR 1.32    No results for input(s): DDIMER in the last 72 hours.  Cardiac Enzymes No results for input(s): CKMB, TROPONINI, MYOGLOBIN in the last 168 hours.  Invalid input(s): CK ------------------------------------------------------------------------------------------------------------------ Invalid input(s): POCBNP     Time Spent in minutes   35   Lajeana Strough K M.D on 10/18/2014 at 10:25 AM  Between 7am to 7pm - Pager - [phone]  After 7pm go to [email or web address] - password [email or web address]  [facility] Office  [phone]

## 2014-10-18 NOTE — Op Note (Signed)
[facility] [address] [place], [address]   COLONOSCOPY PROCEDURE REPORT     EXAM DATE: 11/12/2014  PATIENT NAME:      Nicolas Mccann, Nicolas Mccann           MR #:      [id number] BIRTHDATE:       [birth year]      VISIT #:     [id number]  ATTENDING:     Dorena Cookey, MD     STATUS:     inpatient REFERRING MD: ASA CLASS:  INDICATIONS:  The patient is a 68 yr old male here for a colonoscopy due to rectal bleeding and colitis suggested on CT scan PROCEDURE PERFORMED:     colonoscopy MEDICATIONS:     mac ESTIMATED BLOOD LOSS:     None  CONSENT: The patient understands the risks and benefits of the procedure and understands that these risks include, but are not limited to: sedation, allergic reaction, infection, perforation and/or bleeding. Alternative means of evaluation and treatment include, among others: physical exam, x-rays, and/or surgical intervention. The patient elects to proceed with this endoscopic procedure.  DESCRIPTION OF PROCEDURE: During intra-op preparation period all mechanical & medical equipment was checked for proper function. Hand hygiene and appropriate measures for infection prevention was taken. After the risks, benefits and alternatives of the procedure were thoroughly explained, Informed consent was verified, confirmed and timeout was successfully executed by the treatment team. A digital exam was performed.           The Pentax Ped Colon [id number] endoscope was introduced through the anus and advanced to the cecum.     . No adverse events experienced. The prep was fairly good but I could not rule out all lesions smaller than 5 cm.     . The instrument was then slowly withdrawn as the colon was fully examined. scattered right and left-sided diverticuli,, possible mild edema but no other overt evidence of inflammation in the sigmoid and descending colon      retroflexion within the rectum was unremarkable       The scope was then  completely withdrawn from the patient and the procedure terminated. WITHDRAWAL TIME:    ADVERSE EVENTS:      There were no immediate complications.  IMPRESSIONS:     diverticulosis, minimal edema in the left colon no visible inflammation or signs of bleeding  RECOMMENDATIONS:     advance diet, monitor stools and hemoglobin RECALL:  Dorena Cookey, MD eSigned:  Dorena Cookey, MD 12-Nov-2014 10:07 AM   cc:  CPT CODES: ICD CODES:  The ICD and CPT codes recommended by this software are interpretations from the data that the clinical staff has captured with the software.  The verification of the translation of this report to the ICD and CPT codes and modifiers is the sole responsibility of the health care institution and practicing physician where this report was generated.  [employer] will not be held responsible for the validity of the ICD and CPT codes included on this report.  AMA assumes no liability for data contained or not contained herein. CPT is a Publishing rights manager of the [employer].   PATIENT NAME:  Nicolas Mccann, Nicolas Mccann MR#: [id number]

## 2014-10-18 NOTE — Transfer of Care (Signed)
Immediate Anesthesia Transfer of Care Note  Patient: Nicolas Mccann  Procedure(s) Performed: Procedure(s): COLONOSCOPY WITH PROPOFOL (N/A)  Patient Location: Endoscopy Unit  Anesthesia Type:MAC  Level of Consciousness: awake, alert  and oriented  Airway & Oxygen Therapy: Patient Spontanous Breathing and Patient connected to nasal cannula oxygen  Post-op Assessment: Report given to PACU RN and Post -op Vital signs reviewed and stable  Post vital signs: Reviewed and stable  Complications: No apparent anesthesia complications

## 2014-10-18 NOTE — Progress Notes (Signed)
[facility] Progress Note  Subjective: Tolerated bowel prep. No bleeding  Objective: Vital signs in last 24 hours: Temp:  [97.6 F (36.4 Mccann)-98.4 F (36.9 Mccann)] 97.6 F (36.4 Mccann) (12/11 1003) Pulse Rate:  [92-101] 92 (12/11 1001) Resp:  [17-21] 19 (12/11 1001) BP: (80-103)/(37-64) 86/37 mmHg (12/11 1001) SpO2:  [91 %-100 %] 96 % (12/11 1001) Weight change:    PE: Abdomen soft  Lab Results: Results for orders placed or performed during the hospital encounter of 10/16/14 (from the past 24 hour(s))  Glucose, capillary     Status: Abnormal   Collection Time: 10/17/14 11:28 AM  Result Value Ref Range   Glucose-Capillary 123 (H) 70 - 99 mg/dL  Glucose, capillary     Status: Abnormal   Collection Time: 10/17/14  4:58 PM  Result Value Ref Range   Glucose-Capillary 106 (H) 70 - 99 mg/dL  Glucose, capillary     Status: Abnormal   Collection Time: 10/17/14  7:59 PM  Result Value Ref Range   Glucose-Capillary 111 (H) 70 - 99 mg/dL  Glucose, capillary     Status: Abnormal   Collection Time: 10/18/14 12:06 AM  Result Value Ref Range   Glucose-Capillary 116 (H) 70 - 99 mg/dL  Glucose, capillary     Status: Abnormal   Collection Time: 10/18/14  4:19 AM  Result Value Ref Range   Glucose-Capillary 105 (H) 70 - 99 mg/dL  Glucose, capillary     Status: Abnormal   Collection Time: 10/18/14  7:36 AM  Result Value Ref Range   Glucose-Capillary 111 (H) 70 - 99 mg/dL    Studies/Results: Ct Abdomen Pelvis W Contrast  10/16/2014   CLINICAL DATA:  Rectal bleeding.  EXAM: CT ABDOMEN AND PELVIS WITH CONTRAST  TECHNIQUE: Multidetector CT imaging of the abdomen and pelvis was performed using the standard protocol following bolus administration of intravenous contrast.  CONTRAST:  OMNIPAQUE IOHEXOL 300 MG/ML  SOLN  COMPARISON:  August 21, 2014.  FINDINGS: Mild multilevel degenerative disc disease is noted in the lumbar spine. Visualized lung bases appear normal.  No gallstones are  noted. Gallbladder is contracted with mild amount of surrounding fluid which may be due to adjacent hepatic cellular disease. Probable fatty infiltration of the liver. The spleen and pancreas appear normal. Adrenal glands appear normal. Severe bilateral renal atrophy is again noted. The appendix appears normal. There is no evidence of bowel obstruction. Small amount of free fluid is noted in the right pericolic gutter. Minimal the mild wall thickening of the sigmoid colon is noted suggesting some degree of colitis. No small bowel dilatation is noted. Atherosclerotic calcifications of abdominal aorta are noted without aneurysm formation. Urinary bladder is decompressed. Bilateral enlarged inguinal adenopathy is noted which was present on prior exam. Stable enlarged bilateral iliac adenopathy is also noted.  IMPRESSION: Gallbladder is contracted with no cholelithiasis noted. However, mild amount of fluid is noted around the gallbladder which may be due to adjacent hepatocellular disease. Low density is noted throughout the liver which may represent fatty infiltration or other diffuse hepatocellular disease. There is noted fluid around the liver suggesting hepatocellular disease.  Severely atrophic bilateral kidneys are again noted.  Stable bilateral inguinal and iliac adenopathy is noted compared to prior exam. It is uncertain if this is inflammatory or neoplastic in origin.  Mild wall thickening of the sigmoid colon is noted which may be due to lack of distension, but some degree of colitis cannot be excluded.   Electronically Signed  By: Roque LiasJames  Green M.D.   On: 10/16/2014 12:13   Colonoscopy minimal findings of left-sided edema and a few diverticuli otherwise normal   Assessment: Lower GI bleed with previous colitis which seems to have for the most part healed no current active bleeding or significant inflammation  Plan: Advance diet and monitor stools and hemoglobin. We'll sign off for  now.    Nicolas Mccann,Nicolas Mccann 10/18/2014, 10:07 AM

## 2014-10-19 LAB — HEMOGLOBIN AND HEMATOCRIT, BLOOD
HCT: 32.7 % — ABNORMAL LOW (ref 39.0–52.0)
Hemoglobin: 10.6 g/dL — ABNORMAL LOW (ref 13.0–17.0)

## 2014-10-19 LAB — MAGNESIUM: Magnesium: 2.2 mg/dL (ref 1.5–2.5)

## 2014-10-19 LAB — GLUCOSE, CAPILLARY
GLUCOSE-CAPILLARY: 149 mg/dL — AB (ref 70–99)
GLUCOSE-CAPILLARY: 122 mg/dL — AB (ref 70–99)
Glucose-Capillary: 141 mg/dL — ABNORMAL HIGH (ref 70–99)

## 2014-10-19 MED ORDER — PANTOPRAZOLE SODIUM 40 MG PO TBEC: 40.0000 mg | DELAYED_RELEASE_TABLET | Freq: Every day | ORAL | Status: AC

## 2014-10-19 NOTE — Progress Notes (Signed)
Pt resting; easily aroused. Pt episode of 7 beats run of VTach. Asymptomatic. Notified on call, Benedetto Coons, NP. New orders received. Will continue to monitor.

## 2014-10-19 NOTE — Evaluation (Signed)
Physical Therapy Evaluation Patient Details Name: Nicolas Mccann MRN: [id number] DOB: Jun 12, 1946 Today's Date: 10/19/2014   History of Present Illness  Pt presents for colonscopy for continued rectal bleeding. PMH includes ESRD, DM, anemia, HTN.  Clinical Impression  Pt admitted with above diagnosis. Pt currently with functional limitations due to the deficits listed below (see PT Problem List). Pt ambulated 75' requiring RW and min A with 2/4 DOE, O2 sats remained 92%. This is a functional decline for pt.  Pt will benefit from skilled PT to increase their independence and safety with mobility to allow discharge to the venue listed below.       Follow Up Recommendations Home health PT;Supervision - Intermittent    Equipment Recommendations  None recommended by PT    Recommendations for Other Services       Precautions / Restrictions Precautions Precautions: Fall Precaution Comments: pt fell week before coming in and son had to get him up Restrictions Weight Bearing Restrictions: No      Mobility  Bed Mobility               General bed mobility comments: pt received sitting EOB  Transfers Overall transfer level: Needs assistance Equipment used: Rolling walker (2 wheeled) Transfers: Sit to/from Stand Sit to Stand: Min guard         General transfer comment: pt feeling weak, usually stands with SPC but used RW today, needed UE support once standing  Ambulation/Gait Ambulation/Gait assistance: Min assist Ambulation Distance (Feet): 75 Feet Assistive device: Rolling walker (2 wheeled) Gait Pattern/deviations: Step-through pattern;Decreased stride length;Trunk flexed Gait velocity: decreased   General Gait Details: bilateral knees flexed, trunk flexed, able to correct partially with vc's, increased fatigue with increased distance. Pt planned to begin with RW and transition to [facility] but fatigue to great, required continued use of RW  Stairs            Wheelchair  Mobility    Modified Rankin (Stroke Patients Only)       Balance Overall balance assessment: Needs assistance Sitting-balance support: No upper extremity supported Sitting balance-Leahy Scale: Good     Standing balance support: Bilateral upper extremity supported;During functional activity Standing balance-Leahy Scale: Poor                               Pertinent Vitals/Pain Pain Assessment: No/denies pain    Home Living Family/patient expects to be discharged to:: Private residence Living Arrangements: Spouse/significant other;Other relatives;Children Available Help at Discharge: Family;Available 24 hours/day Type of Home: House Home Access: Ramped entrance     Home Layout: One level Home Equipment: Cane - single point;Walker - 2 wheels Additional Comments: pt usually ambulates with SPC    Prior Function Level of Independence: Independent         Comments: family takes him to HD     Hand Dominance   Dominant Hand: Right    Extremity/Trunk Assessment   Upper Extremity Assessment: Generalized weakness           Lower Extremity Assessment: Generalized weakness;RLE deficits/detail;LLE deficits/detail RLE Deficits / Details: hip flex 3+/5, knee flex/ ext 3+/5, difficulty sustaining contraction, fatigue with repetition LLE Deficits / Details: same as LLE  Cervical / Trunk Assessment: Kyphotic  Communication   Communication: No difficulties  Cognition Arousal/Alertness: Awake/alert Behavior During Therapy: WFL for tasks assessed/performed Overall Cognitive Status: Within Functional Limits for tasks assessed  General Comments General comments (skin integrity, edema, etc.): pt O2 sats 92% with ambulation on RA but pt with 2/4 DOE, HR 101    Exercises General Exercises - Lower Extremity Ankle Circles/Pumps: AROM;Both;10 reps;Seated      Assessment/Plan    PT Assessment Patient needs continued PT services   PT Diagnosis Difficulty walking;Generalized weakness;Abnormality of gait   PT Problem List Decreased strength;Decreased activity tolerance;Decreased balance;Decreased mobility;Decreased knowledge of use of DME;Decreased knowledge of precautions;Decreased safety awareness;Cardiopulmonary status limiting activity  PT Treatment Interventions DME instruction;Gait training;Functional mobility training;Therapeutic exercise;Therapeutic activities;Balance training;Patient/family education   PT Goals (Current goals can be found in the Care Plan section) Acute Rehab PT Goals Patient Stated Goal: find out why he's so SOB and weak PT Goal Formulation: With patient Time For Goal Achievement: 11/02/14 Potential to Achieve Goals: Good    Frequency Min 3X/week   Barriers to discharge        Co-evaluation               End of Session Equipment Utilized During Treatment: Gait belt Activity Tolerance: Patient tolerated treatment well;Patient limited by fatigue Patient left: in chair;with call bell/phone within reach Nurse Communication: Mobility status         Time: [id number] PT Time Calculation (min) (ACUTE ONLY): 22 min   Charges:   PT Evaluation $Initial PT Evaluation Tier I: 1 Procedure PT Treatments $Gait Training: 8-22 mins   PT G Codes:        Lyanne CoVictoria Keivon Garden, PT  Acute Rehab Services  [phone]   Lyanne CoManess, Zsofia Prout 10/19/2014, 12:29 PM

## 2014-10-19 NOTE — Progress Notes (Signed)
CARE MANAGEMENT NOTE 10/19/2014  Patient:  Nicolas Mccann, Nicolas Mccann   Account Number:  [id number]  Date Initiated:  10/19/2014  Documentation initiated by:  [facility]  Subjective/Objective Assessment:   CVA     Action/Plan:   Anticipated DC Date:  10/19/2014   Anticipated DC Plan:  HOME W HOME HEALTH SERVICES      DC Planning Services  CM consult      [facility]  HOME HEALTH   Choice offered to / List presented to:  C-1 Patient        HH arranged  HH-2 PT      [facility] agency  [facility]   Status of service:  Completed, signed off Medicare Important Message given?  YES (If response is "NO", the following Medicare IM given date fields will be blank) Date Medicare IM given:  10/18/2014 Medicare IM given by:  [facility] Date Additional Medicare IM given:   Additional Medicare IM given by:    Discharge Disposition:  HOME W HOME HEALTH SERVICES  Per UR Regulation:  Reviewed for med. necessity/level of care/duration of stay  If discussed at Long Length of Stay Meetings, dates discussed:    Comments:  10/19/2014 1540 NCM spoke to pt and offered choice for [facility]. Pt requested [facility] for HH. Pt has RW at home. Wife and children are at home to assist with his care. Isidoro Donning RN CCM Case Mgmt phone [phone]

## 2014-10-19 NOTE — Progress Notes (Signed)
Pt BP early on shift 87/67. Received 100 ml bolus. BP increased 101/62. Recheck BP this AM. BP 83/68. Pt asymptomatic, alert, and responsive. Administered 100 ml bolus. BP increased to 100/61. Benedetto Coons, NP made aware. Will continue to monitor.

## 2014-10-19 NOTE — Progress Notes (Signed)
Subjective:   Sitting up eating breakfast. No complaints  Objective Filed Vitals:   10/18/14 2221 10/19/14 0502 10/19/14 0508 10/19/14 0537  BP: 101/62 73/41 83/68  100/69  Pulse: 106 51 80 78  Temp:   99 F (37.2 C)   TempSrc:   Oral   Resp:      Height:      Weight:      SpO2:  98%     Physical Exam General: alert and oriented. No acute distress.  Heart: RRR Lungs: CTA. Unlabored.  Abdomen: soft, nontender +BS Extremities: +1 LE edema  Dialysis Access: L AVF +b/t   Dialysis Orders: MWF @ [facility] 4:15 130.5 kg 2K/2Ca 450/800 Profile 4 No heparin AVF @ [facility] Hectorol 5 mcg No Aranesp Venofer 100 x 10 (through 12/23)  Assessment/Plan: 1. Rectal bleeding - previous episode in Oct, treated for ischemic colitis with Cipro & Flagyl, missed follow-up with Dr. Matthias HughsBuccini 11/24 to arrange colonoscopy; CT yesterday showed mild wall thickening of sigmoid colon, suggesting possible colitis. Colonoscopy this AM- showed diverticulosis, minimal edema in the left colon. No signs of bleeding 2. ESRD - HD on MWF @ [facility], K 5.4- pre HD yesterday. HD next on monday 3. HTN/volume - BP 100/69 with symptoms of orthostatic hypotension, no meds;- with LE edema. Midodrine started 12/10 4. Anemia - Hgb 10.6, no Aranesp, Venofer 100 mg x 10. monitor CBC 5. Metabolic bone disease - Ca 8.5, P 4.5, iPTH 1115; Hectorol 5 mcg, Fosrenol 1 g with meals. 6. Nutrition - Alb 3.1, renal diet, vitamin. 7. DM - per primary 8. dispo- possible DC tomorrow if hgb stable   Jetty DuhamelBridget Whelan, NP [facility] Beeper [phone] 10/19/2014,9:15 AM  LOS: 3 days    Renal Attending: As noted above. Agree with note as articulated Koda Routon C    Additional Objective Labs: Basic Metabolic Panel:  Recent Labs Lab 10/16/14 0644 10/16/14 1137 10/18/14 1200  NA 140 137 137  K 3.9 3.9 5.4*  CL 97 96 97  CO2 26 23 22   GLUCOSE 143* 108* 174*  BUN 40* 42* 32*   CREATININE 6.48* 6.82* 5.79*  CALCIUM 8.3* 8.0* 8.5  PHOS  --  4.3 4.5   Liver Function Tests:  Recent Labs Lab 10/16/14 0644 10/16/14 1137 10/18/14 1200  AST 24  --   --   ALT 19  --   --   ALKPHOS 186*  --   --   BILITOT 1.4*  --   --   PROT 8.2  --   --   ALBUMIN 3.2* 3.1* 3.1*   No results for input(s): LIPASE, AMYLASE in the last 168 hours. CBC:  Recent Labs Lab 10/16/14 0644 10/16/14 1137 10/18/14 1200 10/19/14 0429  WBC 8.1 7.2 6.8  --   HGB 10.9* 10.6* 10.5* 10.6*  HCT 33.0* 31.5* 31.9* 32.7*  MCV 107.5* 108.6* 107.8*  --   PLT 186 175 190  --    Blood Culture    Component Value Date/Time   SDES BLOOD HAND RIGHT 08/21/2014 1519   SPECREQUEST BOTTLES DRAWN AEROBIC AND ANAEROBIC 5CC 08/21/2014 1519   CULT  08/21/2014 1519    NO GROWTH 5 DAYS [facility]formed at [facility]   REPTSTATUS 08/27/2014 FINAL 08/21/2014 1519    Cardiac Enzymes: No results for input(s): CKTOTAL, CKMB, CKMBINDEX, TROPONINI in the last 168 hours. CBG:  Recent Labs Lab 10/18/14 0736 10/18/14 1128 10/18/14 1809 10/18/14 2122 10/19/14 0752  GLUCAP 111* 115* 129* 149* 141*  Iron Studies: No results for input(s): IRON, TIBC, TRANSFERRIN, FERRITIN in the last 72 hours. @lablastinr3 @ Studies/Results: No results found. Medications: . sodium chloride 10 mL/hr at 10/17/14 0930   . allopurinol  100 mg Oral Daily  . amitriptyline  50 mg Oral QHS  . atorvastatin  40 mg Oral q1800  . doxercalciferol  5 mcg Intravenous Q M,W,F-HD  . insulin aspart  0-5 Units Subcutaneous QHS  . insulin aspart  0-9 Units Subcutaneous TID WC  . lanthanum  1,000 mg Oral TID WC  . midodrine  10 mg Oral TID WC  . multivitamin  1 tablet Oral QHS  . pantoprazole (PROTONIX) IV  40 mg Intravenous Q24H

## 2014-10-19 NOTE — Discharge Instructions (Signed)
Follow with Primary MD Lora Paula, MD in 7 days   Get CBC, CMP, 2 view Chest X ray checked  by Primary MD next visit.    Activity: As tolerated with Full fall precautions use walker/cane & assistance as needed   Disposition Home     Diet: Renal, low carbohydrate. 1.2 L fluid restriction daily..  For Heart failure patients - Check your Weight same time everyday, if you gain over 2 pounds, or you develop in leg swelling, experience more shortness of breath or chest pain, call your Primary MD immediately. Follow Cardiac Low Salt Diet and 1.2 lit/day fluid restriction.   On your next visit with your primary care physician please Get Medicines reviewed and adjusted.   Please request your Prim.MD to go over all Hospital Tests and Procedure/Radiological results at the follow up, please get all Hospital records sent to your Prim MD by signing hospital release before you go home.   If you experience worsening of your admission symptoms, develop shortness of breath, life threatening emergency, suicidal or homicidal thoughts you must seek medical attention immediately by calling 911 or calling your MD immediately  if symptoms less severe.  You Must read complete instructions/literature along with all the possible adverse reactions/side effects for all the Medicines you take and that have been prescribed to you. Take any new Medicines after you have completely understood and accpet all the possible adverse reactions/side effects.   Do not drive, operating heavy machinery, perform activities at heights, swimming or participation in water activities or provide baby sitting services if your were admitted for syncope or siezures until you have seen by Primary MD or a Neurologist and advised to do so again.  Do not drive when taking Pain medications.    Do not take more than prescribed Pain, Sleep and Anxiety Medications  Special Instructions: If you have smoked or chewed Tobacco  in the last  2 yrs please stop smoking, stop any regular Alcohol  and or any Recreational drug use.  Wear Seat belts while driving.   Please note  You were cared for by a hospitalist during your hospital stay. If you have any questions about your discharge medications or the care you received while you were in the hospital after you are discharged, you can call the unit and asked to speak with the hospitalist on call if the hospitalist that took care of you is not available. Once you are discharged, your primary care physician will handle any further medical issues. Please note that NO REFILLS for any discharge medications will be authorized once you are discharged, as it is imperative that you return to your primary care physician (or establish a relationship with a primary care physician if you do not have one) for your aftercare needs so that they can reassess your need for medications and monitor your lab values.

## 2014-10-19 NOTE — Discharge Planning (Signed)
Nicolas Mccann, is a 68 y.o. male  DOB [birth year]  MRN [id number].  Admission date:  10/16/2014  Admitting Physician  Kathlen Mody, MD  Discharge Date:  10/19/2014   Primary MD  Lora Paula, MD  Recommendations for primary care physician for things to follow:   Monitor H&H, he must follow with GI closely for her GI bleed and nonspecific liver findings on CT scan   Admission Diagnosis  Lower gastrointestinal bleeding [K92.2] Rectal bleed [K62.5]   Discharge Diagnosis  Lower gastrointestinal bleeding [K92.2] Rectal bleed [K62.5]   Principal Problem:   BRBPR (bright red blood per rectum) Active Problems:   CKD (chronic kidney disease) stage V requiring chronic dialysis   Anemia in chronic renal disease   Diabetes mellitus with renal complications   Congestive dilated cardiomyopathy/EF 35-40%   Rectal bleeding   Lower GI bleeding      Past Medical History  Diagnosis Date  . Obesity   . Chest pain 03/05/2014  . Shortness of breath   . Arthritis     ALL OVER  . Hypertension dx 1965  . History of gout   . ESRD on hemodialysis Dx 2005    [facility]",MWF", ESRD due to DM/HTN, started dialysis in Sep 12, 2012 (10/16/2014)  . Renal failure   . Type II diabetes mellitus dx 1965  . Lower GI bleeding 10/16/2014 admission    Past Surgical History  Procedure Laterality Date  . Insertion of dialysis catheter  09/15/2012    Procedure: INSERTION OF DIALYSIS CATHETER;  Surgeon: Chuck Hint, MD;  Location: [facility] OR;  Service: Vascular;  Laterality: N/A;  . Menisectomy Left   . Av fistula placement Left   . Tonsillectomy  1955  . Cataract extraction w/ intraocular lens  implant, bilateral Bilateral   . Shuntogram Left 03/12/2013    Procedure: Fistulogram;  Surgeon: Fransisco Hertz, MD;  Location: [facility] CATH LAB;  Service:  Cardiovascular;  Laterality: Left;  . Shuntogram Left 08/20/2013    Procedure: FISTULOGRAM ;  Surgeon: Fransisco Hertz, MD;  Location: [facility] CATH LAB;  Service: Cardiovascular;  Laterality: Left;       History of present illness and  Hospital Course:     Kindly see H&P for history of present illness and admission details, please review complete Labs, Consult reports and Test reports for all details in brief  HPI  from the history and physical done on the day of admission  Nicolas Mccann is a 67 y.o. male with prior h/o hypertension, atrial fibrillation not on anticoagulation, chronic systolic heart failure, ESRD on HD on MWF, chronic anemia, h/o GI bleed , ? Ischemic colitis, gout , Dm type 2 , comes in for loose stools yesterday, followed by BRBPR this am, one episode, . He denies any abdominal pain, nausea, or vomiting. Reports dizziness on standing. No fever or chills. He never had a colonoscopy. He came in October for similar complaints and was discharged home to follow up with Dr Matthias Hughs as outpatient  for colonoscopy. He reports missing the NOV 24th appt to the Gi office. His baseline hemoglobin is around 10 and his hemoglobin is 10.9 today. He is also due for his HD today. He denies any other complaints. He was referred to medical service for admission for evaluation of GI bleed.     Hospital Course    1. Blood per rectum. Lower GI bleed of unclear origin, H&H stable, GI following. H&H stable. Colonoscopy showing mild left-sided edema with a few diverticuli, no active bleed. Bleeding likely due to previous colitis or diverticular. Further bleed H&H stable for discharge with close outpatient GI follow-up for this problem and nonspecific findings of liver changes on CT scan.    2.ESRD requiring dialysis. Renal on board. On Monday Wednesday Friday schedule.Dialyzed yesterday.   3. Chronic systolic heart failure with EF of 35%. Chronic hypotension. Blood pressure too low to tolerate beta blocker  or ACE/ARB, on midodrine chronically for low blood pressure, currently compensated from a heart standpoint.   4. DM type II. A1c 6.9 on home regimen. Request PCP to monitor glycemic control.    5. Anemia. Combination of anemia of chronic disease and recent blood loss. Stable no need for transfusion needed.    6. Nonspecific liver findings on CT scan. GI on board. Will have him follow with GI post discharge as well.     Discharge Condition: Stable   Follow UP  Follow-up Information    Follow up with Lora Paula, MD. Schedule an appointment as soon as possible for a visit in 1 week.   Specialty:  Family Medicine   Why:  And your kidney doctor   Contact information:   [address] [place] [address] [phone]       Follow up with HAYES,Darden C, MD. Schedule an appointment as soon as possible for a visit in 1 week.   Specialty:  Gastroenterology   Contact information:   [address] [place] [address] [phone]         Discharge Instructions  and  Discharge Medications      Discharge Instructions    Discharge instructions    Complete by:  As directed   Follow with Primary MD Lora Paula, MD in 7 days   Get CBC, CMP, 2 view Chest X ray checked  by Primary MD next visit.    Activity: As tolerated with Full fall precautions use walker/cane & assistance as needed   Disposition Home     Diet: Renal, low carbohydrate. 1.2 L fluid restriction daily..  For Heart failure patients - Check your Weight same time everyday, if you gain over 2 pounds, or you develop in leg swelling, experience more shortness of breath or chest pain, call your Primary MD immediately. Follow Cardiac Low Salt Diet and 1.2 lit/day fluid restriction.   On your next visit with your primary care physician please Get Medicines reviewed and adjusted.   Please request your Prim.MD to go over all Hospital Tests and Procedure/Radiological  results at the follow up, please get all Hospital records sent to your Prim MD by signing hospital release before you go home.   If you experience worsening of your admission symptoms, develop shortness of breath, life threatening emergency, suicidal or homicidal thoughts you must seek medical attention immediately by calling 911 or calling your MD immediately  if symptoms less severe.  You Must read complete instructions/literature along with all the possible adverse reactions/side effects for all the  Medicines you take and that have been prescribed to you. Take any new Medicines after you have completely understood and accpet all the possible adverse reactions/side effects.   Do not drive, operating heavy machinery, perform activities at heights, swimming or participation in water activities or provide baby sitting services if your were admitted for syncope or siezures until you have seen by Primary MD or a Neurologist and advised to do so again.  Do not drive when taking Pain medications.    Do not take more than prescribed Pain, Sleep and Anxiety Medications  Special Instructions: If you have smoked or chewed Tobacco  in the last 2 yrs please stop smoking, stop any regular Alcohol  and or any Recreational drug use.  Wear Seat belts while driving.   Please note  You were cared for by a hospitalist during your hospital stay. If you have any questions about your discharge medications or the care you received while you were in the hospital after you are discharged, you can call the unit and asked to speak with the hospitalist on call if the hospitalist that took care of you is not available. Once you are discharged, your primary care physician will handle any further medical issues. Please note that NO REFILLS for any discharge medications will be authorized once you are discharged, as it is imperative that you return to your primary care physician (or establish a relationship with a primary  care physician if you do not have one) for your aftercare needs so that they can reassess your need for medications and monitor your lab values.     Increase activity slowly    Complete by:  As directed             Medication List    STOP taking these medications        ciprofloxacin 500 MG tablet  Commonly known as:  CIPRO     metroNIDAZOLE 500 MG tablet  Commonly known as:  FLAGYL      TAKE these medications        allopurinol 100 MG tablet  Commonly known as:  ZYLOPRIM  Take 100 mg by mouth daily.     amitriptyline 50 MG tablet  Commonly known as:  ELAVIL  Take 50 mg by mouth at bedtime.     amitriptyline 25 MG tablet  Commonly known as:  ELAVIL  Take 25 mg by mouth at bedtime as needed for sleep.     atorvastatin 40 MG tablet  Commonly known as:  LIPITOR  Take 1 tablet (40 mg total) by mouth daily at 6 PM.     b complex-vitamin c-folic acid 0.8 MG Tabs tablet  Take 0.8 mg by mouth daily.     feeding supplement (NEPRO CARB STEADY) Liqd  Take 237 mLs by mouth as needed (missed meal during dialysis.).     lanthanum 1000 MG chewable tablet  Commonly known as:  FOSRENOL  Chew 1,000 mg by mouth 2 (two) times daily with a meal.     linagliptin 5 MG Tabs tablet  Commonly known as:  TRADJENTA  Take 1 tablet (5 mg total) by mouth daily.     metoprolol tartrate 25 MG tablet  Commonly known as:  LOPRESSOR  Take 12.5 mg by mouth 2 (two) times daily.     midodrine 10 MG tablet  Commonly known as:  PROAMATINE  Take 1 tablet (10 mg total) by mouth 2 (two) times daily with a meal.  pantoprazole 40 MG tablet  Commonly known as:  PROTONIX  Take 1 tablet (40 mg total) by mouth daily.     saccharomyces boulardii 250 MG capsule  Commonly known as:  FLORASTOR  Take 1 capsule (250 mg total) by mouth 2 (two) times daily.          Diet and Activity recommendation: See Discharge Instructions above   Consults obtained - GI, renal   Major procedures and  Radiology Reports - PLEASE review detailed and final reports for all details, in brief -   Colonoscopy showing mild left-sided edema with a few diverticuli, no active bleed. Bleeding likely due to previous colitis or diverticular.    Ct Abdomen Pelvis W Contrast  10/16/2014   CLINICAL DATA:  Rectal bleeding.  EXAM: CT ABDOMEN AND PELVIS WITH CONTRAST  TECHNIQUE: Multidetector CT imaging of the abdomen and pelvis was performed using the standard protocol following bolus administration of intravenous contrast.  CONTRAST:  OMNIPAQUE IOHEXOL 300 MG/ML  SOLN  COMPARISON:  August 21, 2014.  FINDINGS: Mild multilevel degenerative disc disease is noted in the lumbar spine. Visualized lung bases appear normal.  No gallstones are noted. Gallbladder is contracted with mild amount of surrounding fluid which may be due to adjacent hepatic cellular disease. Probable fatty infiltration of the liver. The spleen and pancreas appear normal. Adrenal glands appear normal. Severe bilateral renal atrophy is again noted. The appendix appears normal. There is no evidence of bowel obstruction. Small amount of free fluid is noted in the right pericolic gutter. Minimal the mild wall thickening of the sigmoid colon is noted suggesting some degree of colitis. No small bowel dilatation is noted. Atherosclerotic calcifications of abdominal aorta are noted without aneurysm formation. Urinary bladder is decompressed. Bilateral enlarged inguinal adenopathy is noted which was present on prior exam. Stable enlarged bilateral iliac adenopathy is also noted.  IMPRESSION: Gallbladder is contracted with no cholelithiasis noted. However, mild amount of fluid is noted around the gallbladder which may be due to adjacent hepatocellular disease. Low density is noted throughout the liver which may represent fatty infiltration or other diffuse hepatocellular disease. There is noted fluid around the liver suggesting hepatocellular disease.  Severely  atrophic bilateral kidneys are again noted.  Stable bilateral inguinal and iliac adenopathy is noted compared to prior exam. It is uncertain if this is inflammatory or neoplastic in origin.  Mild wall thickening of the sigmoid colon is noted which may be due to lack of distension, but some degree of colitis cannot be excluded.   Electronically Signed   By: Roque Lias M.D.   On: 10/16/2014 12:13    Micro Results      Recent Results (from the past 240 hour(s))  MRSA PCR Screening     Status: None   Collection Time: 10/16/14  6:34 PM  Result Value Ref Range Status   MRSA by PCR NEGATIVE NEGATIVE Final    Comment:        The GeneXpert MRSA Assay (FDA approved for NASAL specimens only), is one component of a comprehensive MRSA colonization surveillance program. It is not intended to diagnose MRSA infection nor to guide or monitor treatment for MRSA infections.        Today   Subjective:   Nicolas Mccann today has no headache,no chest abdominal pain,no new weakness tingling or numbness, feels much better wants to go home today.   Objective:   Blood pressure 100/69, pulse 78, temperature 99 F (37.2 C), temperature source Oral,  resp. rate 19, height 6' 2.5" (1.892 m), weight 133.5 kg (294 lb 5 oz), SpO2 98 %.   Intake/Output Summary (Last 24 hours) at 10/19/14 0948 Last data filed at 10/19/14 0819  Gross per 24 hour  Intake    780 ml  Output   3000 ml  Net  -2220 ml    Exam Awake Alert, Oriented x 3, No new F.N deficits, Normal affect [place].AT,PERRAL Supple Neck,No JVD, No cervical lymphadenopathy appriciated.  Symmetrical Chest wall movement, Good air movement bilaterally, CTAB RRR,No Gallops,Rubs or new Murmurs, No Parasternal Heave +ve B.Sounds, Abd Soft, Non tender, No organomegaly appriciated, No rebound -guarding or rigidity. No Cyanosis, Clubbing or edema, No new Rash or bruise  Data Review   CBC w Diff: Lab Results  Component Value Date   WBC 6.8 10/18/2014    HGB 10.6* 10/19/2014   HCT 32.7* 10/19/2014   PLT 190 10/18/2014   LYMPHOPCT 18 07/23/2014   MONOPCT 10 07/23/2014   EOSPCT 2 07/23/2014   BASOPCT 0 07/23/2014    CMP: Lab Results  Component Value Date   NA 137 10/18/2014   K 5.4* 10/18/2014   CL 97 10/18/2014   CO2 22 10/18/2014   BUN 32* 10/18/2014   CREATININE 5.79* 10/18/2014   PROT 8.2 10/16/2014   ALBUMIN 3.1* 10/18/2014   BILITOT 1.4* 10/16/2014   ALKPHOS 186* 10/16/2014   AST 24 10/16/2014   ALT 19 10/16/2014  .   Total Time in preparing paper work, data evaluation and todays exam - 35 minutes  Leroy Sea M.D on 10/19/2014 at 9:48 AM  [facility] Office  [phone]

## 2014-10-21 ENCOUNTER — Encounter (HOSPITAL_COMMUNITY): Payer: Self-pay | Admitting: Gastroenterology

## 2014-10-30 NOTE — Discharge Summary (Signed)
Nicolas Mccann, is a 68 y.o. male  DOB 1945-12-24  MRN [id number].  Admission date:  10/16/2014  Admitting Physician  Kathlen Mody, MD  Discharge Date:  10/30/2014   Primary MD  Lora Paula, MD  Recommendations for primary care physician for things to follow:   Monitor H&H, he must follow with GI closely for her GI bleed and nonspecific liver findings on CT scan   Admission Diagnosis  Lower gastrointestinal bleeding [K92.2] Rectal bleed [K62.5]   Discharge Diagnosis  Lower gastrointestinal bleeding [K92.2] Rectal bleed [K62.5]   Principal Problem:   BRBPR (bright red blood per rectum) Active Problems:   CKD (chronic kidney disease) stage V requiring chronic dialysis   Anemia in chronic renal disease   Diabetes mellitus with renal complications   Congestive dilated cardiomyopathy/EF 35-40%   Rectal bleeding   Lower GI bleeding      Past Medical History  Diagnosis Date  . Obesity   . Chest pain 03/05/2014  . Shortness of breath   . Arthritis     ALL OVER  . Hypertension dx 1965  . History of gout   . ESRD on hemodialysis Dx 2005    [facility]",MWF", ESRD due to DM/HTN, started dialysis in Sep 12, 2012 (10/16/2014)  . Renal failure   . Type II diabetes mellitus dx 1965  . Lower GI bleeding 10/16/2014 admission    Past Surgical History  Procedure Laterality Date  . Insertion of dialysis catheter  09/15/2012    Procedure: INSERTION OF DIALYSIS CATHETER;  Surgeon: Chuck Hint, MD;  Location: [facility] OR;  Service: Vascular;  Laterality: N/A;  . Menisectomy Left   . Av fistula placement Left   . Tonsillectomy  1955  . Cataract extraction w/ intraocular lens  implant, bilateral Bilateral   . Shuntogram Left 03/12/2013    Procedure: Fistulogram;  Surgeon: Fransisco Hertz, MD;  Location: [facility] CATH LAB;  Service:  Cardiovascular;  Laterality: Left;  . Shuntogram Left 08/20/2013    Procedure: FISTULOGRAM ;  Surgeon: Fransisco Hertz, MD;  Location: [facility] CATH LAB;  Service: Cardiovascular;  Laterality: Left;  . Colonoscopy with propofol N/A 10/18/2014    Procedure: COLONOSCOPY WITH PROPOFOL;  Surgeon: Barrie Folk, MD;  Location: [facility] ENDOSCOPY;  Service: Endoscopy;  Laterality: N/A;       History of present illness and  Hospital Course:     Kindly see H&P for history of present illness and admission details, please review complete Labs, Consult reports and Test reports for all details in brief  HPI  from the history and physical done on the day of admission  Nicolas Mccann is a 68 y.o. male with prior h/o hypertension, atrial fibrillation not on anticoagulation, chronic systolic heart failure, ESRD on HD on MWF, chronic anemia, h/o GI bleed , ? Ischemic colitis, gout , Dm type 2 , comes in for loose stools yesterday, followed by BRBPR this am, one episode, . He denies any abdominal pain, nausea, or vomiting. Reports dizziness  on standing. No fever or chills. He never had a colonoscopy. He came in October for similar complaints and was discharged home to follow up with Dr Matthias Hughs as outpatient for colonoscopy. He reports missing the NOV 24th appt to the Gi office. His baseline hemoglobin is around 10 and his hemoglobin is 10.9 today. He is also due for his HD today. He denies any other complaints. He was referred to medical service for admission for evaluation of GI bleed.     Hospital Course    1. Blood per rectum. Lower GI bleed of unclear origin, H&H stable, GI following. H&H stable. Colonoscopy showing mild left-sided edema with a few diverticuli, no active bleed. Bleeding likely due to previous colitis or diverticular. Further bleed H&H stable for discharge with close outpatient GI follow-up for this problem and nonspecific findings of liver changes on CT scan.    2.ESRD requiring dialysis. Renal on board.  On Monday Wednesday Friday schedule.Dialyzed yesterday.   3. Chronic systolic heart failure with EF of 35%. Chronic hypotension. Blood pressure too low to tolerate beta blocker or ACE/ARB, on midodrine chronically for low blood pressure, currently compensated from a heart standpoint.   4. DM type II. A1c 6.9 on home regimen. Request PCP to monitor glycemic control.    5. Anemia. Combination of anemia of chronic disease and recent blood loss. Stable no need for transfusion needed.    6. Nonspecific liver findings on CT scan. GI on board. Will have him follow with GI post discharge as well.     Discharge Condition: Stable   Follow UP  Follow-up Information    Follow up with Lora Paula, MD. Schedule an appointment as soon as possible for a visit in 1 week.   Specialty:  Family Medicine   Why:  And your kidney doctor   Contact information:   [address] [place] [address] [phone]       Follow up with HAYES,Bunyan C, MD. Schedule an appointment as soon as possible for a visit in 1 week.   Specialty:  Gastroenterology   Contact information:   [address], [address] [place] [address] [phone]       Follow up with [facility].   Why:  Home Health Physical Therapy   Contact information:   [address] [place] [address] [phone]         Discharge Instructions  and  Discharge Medications          Discharge Instructions    Discharge instructions    Complete by:  As directed   Follow with Primary MD Lora Paula, MD in 7 days   Get CBC, CMP, 2 view Chest X ray checked  by Primary MD next visit.    Activity: As tolerated with Full fall precautions use walker/cane & assistance as needed   Disposition Home     Diet: Renal, low carbohydrate. 1.2 L fluid restriction daily..  For Heart failure patients - Check your Weight same time everyday, if you gain over 2 pounds, or you  develop in leg swelling, experience more shortness of breath or chest pain, call your Primary MD immediately. Follow Cardiac Low Salt Diet and 1.2 lit/day fluid restriction.   On your next visit with your primary care physician please Get Medicines reviewed and adjusted.   Please request your Prim.MD to go over all Hospital Tests and Procedure/Radiological results at the follow up, please get all Hospital records sent  to your Prim MD by signing hospital release before you go home.   If you experience worsening of your admission symptoms, develop shortness of breath, life threatening emergency, suicidal or homicidal thoughts you must seek medical attention immediately by calling 911 or calling your MD immediately  if symptoms less severe.  You Must read complete instructions/literature along with all the possible adverse reactions/side effects for all the Medicines you take and that have been prescribed to you. Take any new Medicines after you have completely understood and accpet all the possible adverse reactions/side effects.   Do not drive, operating heavy machinery, perform activities at heights, swimming or participation in water activities or provide baby sitting services if your were admitted for syncope or siezures until you have seen by Primary MD or a Neurologist and advised to do so again.  Do not drive when taking Pain medications.    Do not take more than prescribed Pain, Sleep and Anxiety Medications  Special Instructions: If you have smoked or chewed Tobacco  in the last 2 yrs please stop smoking, stop any regular Alcohol  and or any Recreational drug use.  Wear Seat belts while driving.   Please note  You were cared for by a hospitalist during your hospital stay. If you have any questions about your discharge medications or the care you received while you were in the hospital after you are discharged, you can call the unit and asked to speak with the hospitalist on call if  the hospitalist that took care of you is not available. Once you are discharged, your primary care physician will handle any further medical issues. Please note that NO REFILLS for any discharge medications will be authorized once you are discharged, as it is imperative that you return to your primary care physician (or establish a relationship with a primary care physician if you do not have one) for your aftercare needs so that they can reassess your need for medications and monitor your lab values.     Increase activity slowly    Complete by:  As directed             Medication List    STOP taking these medications        ciprofloxacin 500 MG tablet  Commonly known as:  CIPRO     metroNIDAZOLE 500 MG tablet  Commonly known as:  FLAGYL      TAKE these medications        allopurinol 100 MG tablet  Commonly known as:  ZYLOPRIM  Take 100 mg by mouth daily.     amitriptyline 50 MG tablet  Commonly known as:  ELAVIL  Take 50 mg by mouth at bedtime.     amitriptyline 25 MG tablet  Commonly known as:  ELAVIL  Take 25 mg by mouth at bedtime as needed for sleep.     atorvastatin 40 MG tablet  Commonly known as:  LIPITOR  Take 1 tablet (40 mg total) by mouth daily at 6 PM.     b complex-vitamin c-folic acid 0.8 MG Tabs tablet  Take 0.8 mg by mouth daily.     feeding supplement (NEPRO CARB STEADY) Liqd  Take 237 mLs by mouth as needed (missed meal during dialysis.).     lanthanum 1000 MG chewable tablet  Commonly known as:  FOSRENOL  Chew 1,000 mg by mouth 2 (two) times daily with a meal.     linagliptin 5 MG Tabs tablet  Commonly known as:  TRADJENTA  Take 1 tablet (5 mg total) by mouth daily.     metoprolol tartrate 25 MG tablet  Commonly known as:  LOPRESSOR  Take 12.5 mg by mouth 2 (two) times daily.     midodrine 10 MG tablet  Commonly known as:  PROAMATINE  Take 1 tablet (10 mg total) by mouth 2 (two) times daily with a meal.     pantoprazole 40 MG tablet    Commonly known as:  PROTONIX  Take 1 tablet (40 mg total) by mouth daily.     saccharomyces boulardii 250 MG capsule  Commonly known as:  FLORASTOR  Take 1 capsule (250 mg total) by mouth 2 (two) times daily.          Diet and Activity recommendation: See Discharge Instructions above   Consults obtained - GI, renal   Major procedures and Radiology Reports - PLEASE review detailed and final reports for all details, in brief -   Colonoscopy showing mild left-sided edema with a few diverticuli, no active bleed. Bleeding likely due to previous colitis or diverticular.    Ct Abdomen Pelvis W Contrast  10/16/2014   CLINICAL DATA:  Rectal bleeding.  EXAM: CT ABDOMEN AND PELVIS WITH CONTRAST  TECHNIQUE: Multidetector CT imaging of the abdomen and pelvis was performed using the standard protocol following bolus administration of intravenous contrast.  CONTRAST:  100mL OMNIPAQUE IOHEXOL 300 MG/ML  SOLN  COMPARISON:  August 21, 2014.  FINDINGS: Mild multilevel degenerative disc disease is noted in the lumbar spine. Visualized lung bases appear normal.  No gallstones are noted. Gallbladder is contracted with mild amount of surrounding fluid which may be due to adjacent hepatic cellular disease. Probable fatty infiltration of the liver. The spleen and pancreas appear normal. Adrenal glands appear normal. Severe bilateral renal atrophy is again noted. The appendix appears normal. There is no evidence of bowel obstruction. Small amount of free fluid is noted in the right pericolic gutter. Minimal the mild wall thickening of the sigmoid colon is noted suggesting some degree of colitis. No small bowel dilatation is noted. Atherosclerotic calcifications of abdominal aorta are noted without aneurysm formation. Urinary bladder is decompressed. Bilateral enlarged inguinal adenopathy is noted which was present on prior exam. Stable enlarged bilateral iliac adenopathy is also noted.  IMPRESSION: Gallbladder is  contracted with no cholelithiasis noted. However, mild amount of fluid is noted around the gallbladder which may be due to adjacent hepatocellular disease. Low density is noted throughout the liver which may represent fatty infiltration or other diffuse hepatocellular disease. There is noted fluid around the liver suggesting hepatocellular disease.  Severely atrophic bilateral kidneys are again noted.  Stable bilateral inguinal and iliac adenopathy is noted compared to prior exam. It is uncertain if this is inflammatory or neoplastic in origin.  Mild wall thickening of the sigmoid colon is noted which may be due to lack of distension, but some degree of colitis cannot be excluded.   Electronically Signed   By: Roque LiasJames  Green M.D.   On: 10/16/2014 12:13    Micro Results      No results found for this or any previous visit (from the past 240 hour(s)).     Today   Subjective:   Nicolas MoteJohn Mccann today has no headache,no chest abdominal pain,no new weakness tingling or numbness, feels much better wants to go home today.   Objective:   Blood pressure 89/59, pulse 83, temperature 98.3 F (36.8 C), temperature source Oral, resp. rate 24, height 6' 2.5" (1.892  m), weight 133.5 kg (294 lb 5 oz), SpO2 95 %.  No intake or output data in the 24 hours ending 10/30/14 0711  Exam Awake Alert, Oriented x 3, No new F.N deficits, Normal affect [place].AT,PERRAL Supple Neck,No JVD, No cervical lymphadenopathy appriciated.  Symmetrical Chest wall movement, Good air movement bilaterally, CTAB RRR,No Gallops,Rubs or new Murmurs, No Parasternal Heave +ve B.Sounds, Abd Soft, Non tender, No organomegaly appriciated, No rebound -guarding or rigidity. No Cyanosis, Clubbing or edema, No new Rash or bruise  Data Review   CBC w Diff:  Lab Results  Component Value Date   WBC 6.8 10/18/2014   HGB 10.6* 10/19/2014   HCT 32.7* 10/19/2014   PLT 190 10/18/2014   LYMPHOPCT 18 07/23/2014   MONOPCT 10 07/23/2014   EOSPCT 2  07/23/2014   BASOPCT 0 07/23/2014    CMP:  Lab Results  Component Value Date   NA 137 10/18/2014   K 5.4* 10/18/2014   CL 97 10/18/2014   CO2 22 10/18/2014   BUN 32* 10/18/2014   CREATININE 5.79* 10/18/2014   PROT 8.2 10/16/2014   ALBUMIN 3.1* 10/18/2014   BILITOT 1.4* 10/16/2014   ALKPHOS 186* 10/16/2014   AST 24 10/16/2014   ALT 19 10/16/2014  .   Total Time in preparing paper work, data evaluation and todays exam - 35 minutes  Leroy Sea M.D on 10/30/2014 at 7:11 AM  [facility] Office  [phone]

## 2014-11-04 ENCOUNTER — Inpatient Hospital Stay: Payer: Medicare Other | Admitting: Family Medicine

## 2014-11-05 ENCOUNTER — Telehealth: Payer: Self-pay | Admitting: Family Medicine

## 2014-11-05 NOTE — Telephone Encounter (Signed)
Nurse from [facility] called to request an order for a nurse to go out to the patients home for treatment, nurse would like to speak to PCP or PCP's nurse before the order is written for further instructions. Please f/u with pt.

## 2014-11-29 ENCOUNTER — Encounter (HOSPITAL_COMMUNITY): Payer: Self-pay

## 2014-11-29 ENCOUNTER — Emergency Department (HOSPITAL_COMMUNITY)
Admission: EM | Admit: 2014-11-29 | Discharge: 2014-11-29 | Disposition: A | Discharge status: Home / Self Care | Payer: Medicare Other | Attending: Emergency Medicine | Admitting: Emergency Medicine

## 2014-11-29 DIAGNOSIS — Y9389 Activity, other specified: Secondary | ICD-10-CM | POA: Insufficient documentation

## 2014-11-29 DIAGNOSIS — W01198A Fall on same level from slipping, tripping and stumbling with subsequent striking against other object, initial encounter: Secondary | ICD-10-CM | POA: Diagnosis not present

## 2014-11-29 DIAGNOSIS — Z8739 Personal history of other diseases of the musculoskeletal system and connective tissue: Secondary | ICD-10-CM | POA: Insufficient documentation

## 2014-11-29 DIAGNOSIS — W19XXXA Unspecified fall, initial encounter: Secondary | ICD-10-CM

## 2014-11-29 DIAGNOSIS — Y9289 Other specified places as the place of occurrence of the external cause: Secondary | ICD-10-CM | POA: Insufficient documentation

## 2014-11-29 DIAGNOSIS — N186 End stage renal disease: Secondary | ICD-10-CM | POA: Diagnosis not present

## 2014-11-29 DIAGNOSIS — E669 Obesity, unspecified: Secondary | ICD-10-CM | POA: Insufficient documentation

## 2014-11-29 DIAGNOSIS — M109 Gout, unspecified: Secondary | ICD-10-CM | POA: Insufficient documentation

## 2014-11-29 DIAGNOSIS — Z8719 Personal history of other diseases of the digestive system: Secondary | ICD-10-CM | POA: Insufficient documentation

## 2014-11-29 DIAGNOSIS — Z043 Encounter for examination and observation following other accident: Secondary | ICD-10-CM | POA: Diagnosis present

## 2014-11-29 DIAGNOSIS — Z79899 Other long term (current) drug therapy: Secondary | ICD-10-CM | POA: Diagnosis not present

## 2014-11-29 DIAGNOSIS — E119 Type 2 diabetes mellitus without complications: Secondary | ICD-10-CM | POA: Diagnosis not present

## 2014-11-29 DIAGNOSIS — Z992 Dependence on renal dialysis: Secondary | ICD-10-CM | POA: Insufficient documentation

## 2014-11-29 DIAGNOSIS — Y998 Other external cause status: Secondary | ICD-10-CM | POA: Insufficient documentation

## 2014-11-29 DIAGNOSIS — Z87891 Personal history of nicotine dependence: Secondary | ICD-10-CM | POA: Diagnosis not present

## 2014-11-29 NOTE — Discharge Instructions (Signed)

## 2014-11-29 NOTE — ED Notes (Signed)
Per [facility]: pt. From dialysis. After receiving full 4 hour txt pt. Larey Seat getting on [employer] bus. Pt. States he hit his L leg. Denies hitting head. Denies pain at this time. AxO x4.

## 2014-11-29 NOTE — ED Provider Notes (Signed)
CSN: [id number]     Arrival date & time 11/29/14  1442 History   First MD Initiated Contact with Patient 11/29/14 1450     Chief Complaint  Patient presents with  . Fall     (Consider location/radiation/quality/duration/timing/severity/associated sxs/prior Treatment) Patient is a 69 y.o. Mccann presenting with fall. The history is provided by the patient.  Fall This is a new problem. The current episode started less than 1 hour ago. Episode frequency: once. The problem has been resolved. Pertinent negatives include no chest pain, no abdominal pain, no headaches and no shortness of breath. Nothing aggravates the symptoms. Nothing relieves the symptoms. He has tried nothing for the symptoms. The treatment provided significant relief.    Past Medical History  Diagnosis Date  . Obesity   . Chest pain 03/05/2014  . Shortness of breath   . Arthritis     ALL OVER  . Hypertension dx 1965  . History of gout   . ESRD on hemodialysis Dx 2005    [facility]",MWF", ESRD due to DM/HTN, started dialysis in Sep 12, 2012 (10/16/2014)  . Renal failure   . Type II diabetes mellitus dx 1965  . Lower GI bleeding 10/16/2014 admission   Past Surgical History  Procedure Laterality Date  . Insertion of dialysis catheter  09/15/2012    Procedure: INSERTION OF DIALYSIS CATHETER;  Surgeon: Chuck Hint, MD;  Location: [facility] OR;  Service: Vascular;  Laterality: N/A;  . Menisectomy Left   . Av fistula placement Left   . Tonsillectomy  1955  . Cataract extraction w/ intraocular lens  implant, bilateral Bilateral   . Shuntogram Left 03/12/2013    Procedure: Fistulogram;  Surgeon: Fransisco Hertz, MD;  Location: [facility] CATH LAB;  Service: Cardiovascular;  Laterality: Left;  . Shuntogram Left 08/20/2013    Procedure: FISTULOGRAM ;  Surgeon: Fransisco Hertz, MD;  Location: [facility] CATH LAB;  Service: Cardiovascular;  Laterality: Left;  . Colonoscopy with propofol N/A 10/18/2014    Procedure: COLONOSCOPY WITH PROPOFOL;  Surgeon:  Barrie Folk, MD;  Location: [facility] ENDOSCOPY;  Service: Endoscopy;  Laterality: N/A;   Family History  Problem Relation Age of Onset  . Diabetes Mellitus II    . Hypertension    . Hypertension Mother   . Diabetes Mother   . Hypertension Father    History  Substance Use Topics  . Smoking status: Former Smoker    Types: Pipe, Software engineer  . Smokeless tobacco: Former Neurosurgeon    Types: Chew     Comment: "quit chewing when I was 58; quit smoking in 1970's"  . Alcohol Use: No    Review of Systems  Constitutional: Negative for fever.  HENT: Negative for drooling and rhinorrhea.   Eyes: Negative for pain.  Respiratory: Negative for cough and shortness of breath.   Cardiovascular: Negative for chest pain and leg swelling.  Gastrointestinal: Negative for nausea, vomiting, abdominal pain and diarrhea.  Genitourinary: Negative for dysuria and hematuria.  Musculoskeletal: Negative for gait problem and neck pain.  Skin: Negative for color change.  Neurological: Negative for numbness and headaches.  Hematological: Negative for adenopathy.  Psychiatric/Behavioral: Negative for behavioral problems.  All other systems reviewed and are negative.     Allergies  Review of patient's allergies indicates no known allergies.  Home Medications   Prior to Admission medications   Medication Sig Start Date End Date Taking? Authorizing Provider  allopurinol (ZYLOPRIM) 100 MG tablet Take 100 mg by mouth daily. 06/25/14  Historical Provider, MD  amitriptyline (ELAVIL) Nicolas MG tablet Take Nicolas mg by mouth at bedtime as needed for sleep.     Historical Provider, MD  amitriptyline (ELAVIL) 50 MG tablet Take 50 mg by mouth at bedtime.    Historical Provider, MD  atorvastatin (LIPITOR) 40 MG tablet Take 1 tablet (40 mg total) by mouth daily at 6 PM. 03/07/14   Ricki Rodriguez, MD  b complex-vitamin c-folic acid (NEPHRO-VITE) 0.8 MG TABS tablet Take 0.8 mg by mouth daily.     Historical Provider, MD  lanthanum (FOSRENOL)  1000 MG chewable tablet Chew 1,000 mg by mouth 2 (two) times daily with a meal.    Historical Provider, MD  linagliptin (TRADJENTA) 5 MG TABS tablet Take 1 tablet (5 mg total) by mouth daily. 08/08/14   Josalyn C Funches, MD  metoprolol tartrate (LOPRESSOR) Nicolas MG tablet Take 12.5 mg by mouth 2 (two) times daily.    Historical Provider, MD  midodrine (PROAMATINE) 10 MG tablet Take 1 tablet (10 mg total) by mouth 2 (two) times daily with a meal. Patient not taking: Reported on 10/16/2014 08/24/14   Christiane Ha, MD  Nutritional Supplements (FEEDING SUPPLEMENT, NEPRO CARB STEADY,) LIQD Take 237 mLs by mouth as needed (missed meal during dialysis.). Patient not taking: Reported on 10/16/2014 07/26/14   Russella Dar, NP  pantoprazole (PROTONIX) 40 MG tablet Take 1 tablet (40 mg total) by mouth daily. 10/19/14   Leroy Sea, MD  saccharomyces boulardii (FLORASTOR) 250 MG capsule Take 1 capsule (250 mg total) by mouth 2 (two) times daily. Patient not taking: Reported on 10/16/2014 08/24/14   Christiane Ha, MD   BP 98/57 mmHg  Pulse 62  Temp(Src) 97.5 F (36.4 C) (Oral)  Resp 17  SpO2 95% Physical Exam  Constitutional: He is oriented to person, place, and time. He appears well-developed and well-nourished.  HENT:  Head: Normocephalic and atraumatic.  Right Ear: External ear normal.  Left Ear: External ear normal.  Nose: Nose normal.  Mouth/Throat: Oropharynx is clear and moist. No oropharyngeal exudate.  Eyes: Conjunctivae and EOM are normal. Pupils are equal, round, and reactive to light.  Neck: Normal range of motion. Neck supple.  No vertebral tenderness noted.  Cardiovascular: Normal rate, regular rhythm, normal heart sounds and intact distal pulses.  Exam reveals no gallop and no friction rub.   No murmur heard. Pulmonary/Chest: Effort normal and breath sounds normal. No respiratory distress. He has no wheezes.  Abdominal: Soft. Bowel sounds are normal. He exhibits no  distension. There is no tenderness. There is no rebound and no guarding.  Musculoskeletal: Normal range of motion. He exhibits no edema or tenderness.  Neurological: He is alert and oriented to person, place, and time.  alert, oriented x3 speech: normal in context and clarity memory: intact grossly motor strength: full proximally and distally no involuntary movements or tremors sensation: intact to light touch diffusely  gait: normal forwards and backwards   Skin: Skin is warm and dry.  Psychiatric: He has a normal mood and affect. His behavior is normal.  Nursing note and vitals reviewed.   ED Course  Procedures (including critical care time) Labs Review Labs Reviewed - No data to display  Imaging Review No results found.   EKG Interpretation None      MDM   Final diagnoses:  Fall, initial encounter    3:03 PM 69 y.o. Mccann w hx of ESRD on HD (MWF), HTN, obesity, DM who presents  with a mechanical fall which occurred prior to arrival. He states that he was getting on the bus after dialysis when he fell on the lift. He does not believe that he hit his head. He denies loss of consciousness. He has no complaints on exam. He is ambulatory and has normal strength in all extremities. His vital signs are unremarkable. He normally dialyzes for 4 hours and 20 minutes but today only dialyzed for 4 hours. I do not suspect any traumatic injury. I understand that he was heparinized during dialysis but he has no headache and is asymptomatic here and ambulatory. I have no reason to think that he requires any imaging or laboratory work up at all. His blood sugar was 203 per EMS. We'll plan for discharge home.  3:06 PM:  I have discussed the diagnosis/risks/treatment options with the patient and believe the pt to be eligible for discharge home to follow-up with his pcp as needed. We also discussed returning to the ED immediately if new or worsening sx occur. We discussed the sx which are most  concerning (e.g., further falls, pain, HA) that necessitate immediate return. Medications administered to the patient during their visit and any new prescriptions provided to the patient are listed below.  Medications given during this visit Medications - No data to display  New Prescriptions   No medications on file       Purvis Sheffield, MD 11/29/14 [phone]

## 2014-12-18 ENCOUNTER — Ambulatory Visit: Payer: Self-pay | Admitting: Cardiovascular Disease

## 2015-01-02 ENCOUNTER — Ambulatory Visit (INDEPENDENT_AMBULATORY_CARE_PROVIDER_SITE_OTHER): Payer: Medicare Other | Admitting: Cardiovascular Disease

## 2015-01-02 ENCOUNTER — Encounter: Payer: Self-pay | Admitting: Cardiovascular Disease

## 2015-01-02 VITALS — BP 127/60 | HR 98 | Ht 74.5 in | Wt 276.0 lb

## 2015-01-02 DIAGNOSIS — T85618A Breakdown (mechanical) of other specified internal prosthetic devices, implants and grafts, initial encounter: Secondary | ICD-10-CM

## 2015-01-02 DIAGNOSIS — I420 Dilated cardiomyopathy: Secondary | ICD-10-CM

## 2015-01-02 DIAGNOSIS — T859XXA Unspecified complication of internal prosthetic device, implant and graft, initial encounter: Secondary | ICD-10-CM

## 2015-01-02 NOTE — Assessment & Plan Note (Signed)
Discussed low carb diet.  Target hemoglobin A1c is 6.5 or less.  Continue current medications.  

## 2015-01-02 NOTE — Assessment & Plan Note (Signed)
Have called [facility] Dr Nicky Pugh nurse to facilitate appt  Will likely need fistulogram and angioplasty of venous limb

## 2015-01-02 NOTE — Patient Instructions (Addendum)
Your physician wants you to follow-up in: YEAR WITH  DR Haywood Filler will receive a reminder letter in the mail two months in advance. If you don't receive a letter, please call our office to schedule the follow-up appointment. Your physician recommends that you continue on your current medications as directed. Please refer to the Current Medication list given to you today. You have been referred to DR  Leonides Sake    [facility]     ASAP

## 2015-01-02 NOTE — Assessment & Plan Note (Signed)
Stable EF improved on meds no chest pain.  Fluid controlled at dialysis Dry weight 126 kg.  Continue current Rx

## 2015-01-02 NOTE — Progress Notes (Signed)
Patient ID: Nicolas Mccann, male   DOB: 08-Sep-1946, 69 y.o.   MRN: [id number]  69 y.o. diabetic with HTN  ON dialysis for about a year and a half  [address]  Has had issues with dyspnea and hypotension on dialysis.  He is sedentary has gained a lot of weight.  No PND or orhtopnea mild dependant edema.  Exertional dsypnea progressive over a year.  No history of CHF , CAD or DCM.  Compliant with meds.  12/14 had SSCP and left shoulder pain while on dialysis  ECG no acute changes.  Denies excess ETOH.  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  No wheezing cough or sputm  Has not had recurrent chest pain or pain with exertion  Usually gets fatigued and dyspnic before anything else  Seen by Dr Algie Coffer in hospital 12/15 medical Rx.  Has had lower GI bleed   Echo 03/07/14 Study Conclusions  - Left ventricle: The cavity size was mildly dilated. There was mild concentric hypertrophy. Systolic function was moderately reduced. The estimated ejection fraction was in the range of 35% to 40%. There is mild hypokinesis of the entire myocardium. - Mitral valve: Calcified annulus. Mild regurgitation. - Left atrium: The atrium was mildly dilated. - Right ventricle: Systolic function was mildly reduced. ROS: Denies fever, malais, weight loss, blurry vision, decrease  Note 09/2012  EF was 45-50%  No cardiac complaints  LUE fistula with low flow and needs to see Dr Imogene Burn   d visual acuity, cough, sputum, SOB, hemoptysis, pleuritic pain, palpitaitons, heartburn, abdominal pain, melena, lower extremity edema, claudication, or rash.  All other systems reviewed and negative   General: Affect appropriate Overweight black male  HEENT: normal Neck supple with no adenopathy JVP normal no bruits no thyromegaly Lungs clear with no wheezing and good diaphragmatic motion Heart:  S1/S2 no murmur,rub, gallop or click PMI normal Abdomen: benighn, BS positve, no tenderness, no AAA no bruit.  No HSM or HJR Distal  pulses intact with no bruits LUE fistula thrill  Left wrist not functioning  No edema Neuro non-focal Skin warm and dry No muscular weakness  Medications Current Outpatient Prescriptions  Medication Sig Dispense Refill  . allopurinol (ZYLOPRIM) 100 MG tablet Take 100 mg by mouth daily.    Marland Kitchen amitriptyline (ELAVIL) 25 MG tablet Take 25 mg by mouth at bedtime as needed for sleep.     Marland Kitchen amitriptyline (ELAVIL) 50 MG tablet Take 50 mg by mouth at bedtime.    Marland Kitchen atorvastatin (LIPITOR) 40 MG tablet Take 1 tablet (40 mg total) by mouth daily at 6 PM. 30 tablet 2  . b complex-vitamin c-folic acid (NEPHRO-VITE) 0.8 MG TABS tablet Take 0.8 mg by mouth daily.     Marland Kitchen lanthanum (FOSRENOL) 1000 MG chewable tablet Chew 1,000 mg by mouth 2 (two) times daily with a meal.    . linagliptin (TRADJENTA) 5 MG TABS tablet Take 1 tablet (5 mg total) by mouth daily. 30 tablet 5  . metoprolol tartrate (LOPRESSOR) 25 MG tablet Take 12.5 mg by mouth 2 (two) times daily.    . midodrine (PROAMATINE) 10 MG tablet Take 1 tablet (10 mg total) by mouth 2 (two) times daily with a meal. (Patient not taking: Reported on 10/16/2014) 60 tablet 0  . Nutritional Supplements (FEEDING SUPPLEMENT, NEPRO CARB STEADY,) LIQD Take 237 mLs by mouth as needed (missed meal during dialysis.). (Patient not taking: Reported on 10/16/2014) 6 Can prn  . pantoprazole (PROTONIX) 40 MG tablet Take  1 tablet (40 mg total) by mouth daily. 30 tablet 1  . saccharomyces boulardii (FLORASTOR) 250 MG capsule Take 1 capsule (250 mg total) by mouth 2 (two) times daily. (Patient not taking: Reported on 10/16/2014) 12 capsule 0   No current facility-administered medications for this visit.    Allergies Review of patient's allergies indicates no known allergies.  Family History: Family History  Problem Relation Age of Onset  . Diabetes Mellitus II    . Hypertension    . Hypertension Mother   . Diabetes Mother   . Hypertension Father     Social  History: History   Social History  . Marital Status: Married    Spouse Name: N/A  . Number of Children: N/A  . Years of Education: N/A   Occupational History  . Not on file.   Social History Main Topics  . Smoking status: Former Smoker    Types: Pipe, Software engineer  . Smokeless tobacco: Former Neurosurgeon    Types: Chew     Comment: "quit chewing when I was 58; quit smoking in 1970's"  . Alcohol Use: No  . Drug Use: No  . Sexual Activity: Not Currently   Other Topics Concern  . Not on file   Social History Narrative    Electrocardiogram:  SR LAD RBBB poor R wave progression  Assessment and Plan

## 2015-01-06 ENCOUNTER — Other Ambulatory Visit: Payer: Self-pay

## 2015-01-14 ENCOUNTER — Encounter (HOSPITAL_COMMUNITY)
Admission: RE | Disposition: A | Discharge status: Home / Self Care | Payer: Self-pay | Source: Ambulatory Visit | Attending: Surgery

## 2015-01-14 ENCOUNTER — Ambulatory Visit (HOSPITAL_COMMUNITY)
Admission: RE | Admit: 2015-01-14 | Discharge: 2015-01-14 | Disposition: A | Discharge status: Home / Self Care | Payer: Medicare Other | Source: Ambulatory Visit | Attending: Surgery | Admitting: Surgery

## 2015-01-14 ENCOUNTER — Encounter (HOSPITAL_COMMUNITY): Payer: Self-pay | Admitting: Surgery

## 2015-01-14 DIAGNOSIS — E119 Type 2 diabetes mellitus without complications: Secondary | ICD-10-CM | POA: Diagnosis not present

## 2015-01-14 DIAGNOSIS — T829XXA Unspecified complication of cardiac and vascular prosthetic device, implant and graft, initial encounter: Secondary | ICD-10-CM | POA: Diagnosis present

## 2015-01-14 DIAGNOSIS — T82858A Stenosis of vascular prosthetic devices, implants and grafts, initial encounter: Secondary | ICD-10-CM | POA: Diagnosis not present

## 2015-01-14 DIAGNOSIS — Z992 Dependence on renal dialysis: Secondary | ICD-10-CM | POA: Diagnosis not present

## 2015-01-14 DIAGNOSIS — E669 Obesity, unspecified: Secondary | ICD-10-CM | POA: Insufficient documentation

## 2015-01-14 DIAGNOSIS — I871 Compression of vein: Secondary | ICD-10-CM | POA: Insufficient documentation

## 2015-01-14 DIAGNOSIS — M109 Gout, unspecified: Secondary | ICD-10-CM | POA: Diagnosis not present

## 2015-01-14 DIAGNOSIS — M1990 Unspecified osteoarthritis, unspecified site: Secondary | ICD-10-CM | POA: Diagnosis not present

## 2015-01-14 DIAGNOSIS — T82898A Other specified complication of vascular prosthetic devices, implants and grafts, initial encounter: Secondary | ICD-10-CM | POA: Diagnosis not present

## 2015-01-14 DIAGNOSIS — Z87891 Personal history of nicotine dependence: Secondary | ICD-10-CM | POA: Diagnosis not present

## 2015-01-14 DIAGNOSIS — N186 End stage renal disease: Secondary | ICD-10-CM | POA: Insufficient documentation

## 2015-01-14 DIAGNOSIS — Y832 Surgical operation with anastomosis, bypass or graft as the cause of abnormal reaction of the patient, or of later complication, without mention of misadventure at the time of the procedure: Secondary | ICD-10-CM | POA: Diagnosis not present

## 2015-01-14 DIAGNOSIS — I120 Hypertensive chronic kidney disease with stage 5 chronic kidney disease or end stage renal disease: Secondary | ICD-10-CM | POA: Insufficient documentation

## 2015-01-14 DIAGNOSIS — Z6835 Body mass index (BMI) 35.0-35.9, adult: Secondary | ICD-10-CM | POA: Insufficient documentation

## 2015-01-14 HISTORY — PX: SHUNTOGRAM: SHX5491

## 2015-01-14 LAB — POCT I-STAT, CHEM 8
BUN: 34 mg/dL — ABNORMAL HIGH (ref 6–23)
Calcium, Ion: 1.05 mmol/L — ABNORMAL LOW (ref 1.13–1.30)
Chloride: 100 mmol/L (ref 96–112)
Creatinine, Ser: 5.7 mg/dL — ABNORMAL HIGH (ref 0.50–1.35)
GLUCOSE: 126 mg/dL — AB (ref 70–99)
HCT: 37 % — ABNORMAL LOW (ref 39.0–52.0)
Hemoglobin: 12.6 g/dL — ABNORMAL LOW (ref 13.0–17.0)
Potassium: 3.7 mmol/L (ref 3.5–5.1)
SODIUM: 139 mmol/L (ref 135–145)
TCO2: 23 mmol/L (ref 0–100)

## 2015-01-14 SURGERY — ASSESSMENT, SHUNT FUNCTION, WITH CONTRAST RADIOGRAPHIC STUDY

## 2015-01-14 MED ORDER — OXYCODONE-ACETAMINOPHEN 5-325 MG PO TABS
ORAL_TABLET | ORAL | Status: AC
  Filled 2015-01-14: qty 2

## 2015-01-14 MED ORDER — FENTANYL CITRATE 0.05 MG/ML IJ SOLN
INTRAMUSCULAR | Status: AC
  Filled 2015-01-14: qty 2

## 2015-01-14 MED ORDER — MIDAZOLAM HCL 2 MG/2ML IJ SOLN
INTRAMUSCULAR | Status: AC
  Filled 2015-01-14: qty 2

## 2015-01-14 MED ORDER — HYDRALAZINE HCL 20 MG/ML IJ SOLN: 5.0000 mg | INTRAMUSCULAR | Status: DC | PRN

## 2015-01-14 MED ORDER — GUAIFENESIN-DM 100-10 MG/5ML PO SYRP
15.0000 mL | ORAL_SOLUTION | ORAL | Status: DC | PRN
Start: 2015-01-14 — End: 2015-01-14

## 2015-01-14 MED ORDER — ACETAMINOPHEN 325 MG RE SUPP: 325.0000 mg | RECTAL | Status: DC | PRN

## 2015-01-14 MED ORDER — LABETALOL HCL 5 MG/ML IV SOLN: 10.0000 mg | INTRAVENOUS | Status: DC | PRN

## 2015-01-14 MED ORDER — METOPROLOL TARTRATE 1 MG/ML IV SOLN: 2.0000 mg | INTRAVENOUS | Status: DC | PRN

## 2015-01-14 MED ORDER — OXYCODONE-ACETAMINOPHEN 5-325 MG PO TABS
2.0000 | ORAL_TABLET | Freq: Once | ORAL | Status: AC
  Administered 2015-01-14: 2 via ORAL

## 2015-01-14 MED ORDER — PHENOL 1.4 % MT LIQD
1.0000 | OROMUCOSAL | Status: DC | PRN
Start: 2015-01-14 — End: 2015-01-14

## 2015-01-14 MED ORDER — ACETAMINOPHEN 325 MG PO TABS: 325.0000 mg | ORAL_TABLET | ORAL | Status: DC | PRN

## 2015-01-14 MED ORDER — HEPARIN (PORCINE) IN NACL 2-0.9 UNIT/ML-% IJ SOLN
INTRAMUSCULAR | Status: AC
  Filled 2015-01-14: qty 1000

## 2015-01-14 MED ORDER — LIDOCAINE HCL (PF) 1 % IJ SOLN
INTRAMUSCULAR | Status: AC
  Filled 2015-01-14: qty 30

## 2015-01-14 MED ORDER — ALUM & MAG HYDROXIDE-SIMETH 200-200-20 MG/5ML PO SUSP: 15.0000 mL | ORAL | Status: DC | PRN

## 2015-01-14 MED ORDER — ONDANSETRON HCL 4 MG/2ML IJ SOLN: 4.0000 mg | Freq: Four times a day (QID) | INTRAMUSCULAR | Status: DC | PRN

## 2015-01-14 MED ORDER — SODIUM CHLORIDE 0.9 % IJ SOLN: 3.0000 mL | INTRAMUSCULAR | Status: DC | PRN

## 2015-01-14 NOTE — Discharge Instructions (Signed)
Fistulogram, Care After °Refer to this sheet in the next few weeks. These instructions provide you with information on caring for yourself after your procedure. Your health care provider may also give you more specific instructions. Your treatment has been planned according to current medical practices, but problems sometimes occur. Call your health care provider if you have any problems or questions after your procedure. °WHAT TO EXPECT AFTER THE PROCEDURE °After your procedure, it is typical to have the following: °· A small amount of discomfort in the area where the catheters were placed. °· A small amount of bruising around the fistula. °· Sleepiness and fatigue. °HOME CARE INSTRUCTIONS °· Rest at home for the day following your procedure. °· Do not drive or operate heavy machinery while taking pain medicine. °· Take medicines only as directed by your health care provider. °· Do not take baths, swim, or use a hot tub until your health care provider approves. You may shower 24 hours after the procedure or as directed by your health care provider. °· There are many different ways to close and cover an incision, including stitches, skin glue, and adhesive strips. Follow your health care provider's instructions on: °¨ Incision care. °¨ Bandage (dressing) changes and removal. °¨ Incision closure removal. °· Monitor your dialysis fistula carefully. °SEEK MEDICAL CARE IF: °· You have drainage, redness, swelling, or pain at your catheter site. °· You have a fever. °· You have chills. °SEEK IMMEDIATE MEDICAL CARE IF: °· You feel weak. °· You have trouble balancing. °· You have trouble moving your arms or legs. °· You have problems with your speech or vision. °· You can no longer feel a vibration or buzz when you put your fingers over your dialysis fistula. °· The limb that was used for the procedure: °¨ Swells. °¨ Is painful. °¨ Is cold. °¨ Is discolored, such as blue or pale white. °Document Released: 03/11/2014  Document Reviewed: 12/14/2013 °ExitCare® Patient Information ©2015 [employer]. This information is not intended to replace advice given to you by your health care provider. Make sure you discuss any questions you have with your health care provider. ° °

## 2015-01-14 NOTE — Op Note (Signed)
    Patient name: Nicolas Mccann MRN: [id number] DOB: 04/14/1946 Sex: male  01/14/2015 Pre-operative Diagnosis: Poorly functioning left upper arm AV fistula Post-operative diagnosis:  Same Surgeon:  Eldridge Abrahams Procedure Performed:  1.  Ultrasound-guided access, left cephalic vein  2.  Fistulogram  3.  Angioplasty, left cephalic vein  4.  Follow-up 1   Indications:  The patient is having increased flow rates.  He is here for further evaluation.  Procedure:  The patient was identified in the holding area and taken to room 8.  The patient was then placed supine on the table and prepped and draped in the usual sterile fashion.  A time out was called.  Ultrasound was used to evaluate the fistula.  The vein was patent and compressible.  A digital ultrasound image was acquired.  The fistula was then accessed under ultrasound guidance using a micropuncture needle.  An 018 wire was then asvanced without resistance and a micropuncture sheath was placed.  Contrast injections were then performed through the sheath.  Findings:  The fistula is extremely tortuous.  There are several areas of narrowing as the cephalic vein turns down into the axillary vein.  The central venous system is widely patent.   Intervention:  I initially cannulated the cephalic vein near the antecubital crease.  Because the tortuosity I could not advance a wire and a catheter far enough into the fistula to proceed with intervention.  Therefore I elected to cannulate the fistula in the mid upper arm.  Even through this access point I had difficulty getting into the central venous system.  Ultimately with the use of a KMP catheter and a Glidewire, catheter access into the central venous system was obtained.  The patient was given 3000 units of heparin.  A 6 French sheath was inserted.  A Rosen wire was inserted.  I initially performed balloon angioplasty of the cephalic vein at the proximal junction to the deep system using a 6 x 40  Mustang balloon.  This was done at 24 atm for 1 minute.  Completion angiogram revealed improved but suboptimal results.  In addition there appeared to be areas of narrowing in the slightly more distal cephalic vein.  I upsized to an 8 x 40 Mustang balloon and repeated balloon angioplasty of the proximal cephalic vein and then performed additional angioplasties with the 8 mm balloon in the proximal cephalic vein.  Follow-up imaging revealed improved results.  I did not feel upsizing the balloon would be advantageous due to concerns over vein rupture.  There was significantly improved blood flow through the fistula with residual stenosis of approximately 20%, down from 80%.  Both cannulation sites were closed with suture.  There were no immediate complications  Impression:  #1  extremely tortuous cephalic vein fistula.  I had to cannulate this in the mid arm in order to gain access into the central venous system.  For future interventions if the desired treatment areas is in the proximal cephalic vein, however recommend cannulation of near the shoulder.  #2  successful balloon angioplasty of a proximal cephalic vein stenosis in multiple areas using an 8 mm balloon  #3  no central venous stenosis    V. Annamarie Major, M.D. [facility] Office: [phone] Pager:  [phone]

## 2015-01-14 NOTE — H&P (Signed)
Patient name: Nicolas Mccann MRN: [id number] DOB: [birth year] Sex: male    No chief complaint on file.   HISTORY OF PRESENT ILLNESS: 69 yo with ESRD, having trouble with flow rates in dialysis  Past Medical History  Diagnosis Date  . Obesity   . Chest pain 03/05/2014  . Shortness of breath   . Arthritis     ALL OVER  . Hypertension dx 1965  . History of gout   . ESRD on hemodialysis Dx 2005    [facility]",MWF", ESRD due to DM/HTN, started dialysis in Sep 12, 2012 (10/16/2014)  . Renal failure   . Type II diabetes mellitus dx 1965  . Lower GI bleeding 10/16/2014 admission    Past Surgical History  Procedure Laterality Date  . Insertion of dialysis catheter  09/15/2012    Procedure: INSERTION OF DIALYSIS CATHETER;  Surgeon: Chuck Hint, MD;  Location: [facility] OR;  Service: Vascular;  Laterality: N/A;  . Menisectomy Left   . Av fistula placement Left   . Tonsillectomy  1955  . Cataract extraction w/ intraocular lens  implant, bilateral Bilateral   . Shuntogram Left 03/12/2013    Procedure: Fistulogram;  Surgeon: Fransisco Hertz, MD;  Location: [facility] CATH LAB;  Service: Cardiovascular;  Laterality: Left;  . Shuntogram Left 08/20/2013    Procedure: FISTULOGRAM ;  Surgeon: Fransisco Hertz, MD;  Location: [facility] CATH LAB;  Service: Cardiovascular;  Laterality: Left;  . Colonoscopy with propofol N/A 10/18/2014    Procedure: COLONOSCOPY WITH PROPOFOL;  Surgeon: Barrie Folk, MD;  Location: [facility] ENDOSCOPY;  Service: Endoscopy;  Laterality: N/A;    History   Social History  . Marital Status: Married    Spouse Name: N/A  . Number of Children: N/A  . Years of Education: N/A   Occupational History  . Not on file.   Social History Main Topics  . Smoking status: Former Smoker    Types: Pipe, Software engineer  . Smokeless tobacco: Former Neurosurgeon    Types: Chew     Comment: "quit chewing when I was 58; quit smoking in 1970's"  . Alcohol Use: No  . Drug Use: No  . Sexual Activity: Not Currently   Other  Topics Concern  . Not on file   Social History Narrative    Family History  Problem Relation Age of Onset  . Diabetes Mellitus II    . Hypertension    . Hypertension Mother   . Diabetes Mother   . Hypertension Father     Allergies as of 01/06/2015  . (No Known Allergies)    No current facility-administered medications on file prior to encounter.   Current Outpatient Prescriptions on File Prior to Encounter  Medication Sig Dispense Refill  . allopurinol (ZYLOPRIM) 100 MG tablet Take 100 mg by mouth daily.    Marland Kitchen amitriptyline (ELAVIL) 50 MG tablet Take 50 mg by mouth at bedtime.    Marland Kitchen atorvastatin (LIPITOR) 40 MG tablet Take 1 tablet (40 mg total) by mouth daily at 6 PM. 30 tablet 2  . b complex-vitamin c-folic acid (NEPHRO-VITE) 0.8 MG TABS tablet Take 0.8 mg by mouth daily.     . cinacalcet (SENSIPAR) 60 MG tablet Take 120 mg by mouth every other day. TAKE 2 TABS PO QD    . pantoprazole (PROTONIX) 40 MG tablet Take 1 tablet (40 mg total) by mouth daily. 30 tablet 1  . linagliptin (TRADJENTA) 5 MG TABS tablet Take 1 tablet (5 mg  total) by mouth daily. (Patient not taking: Reported on 01/02/2015) 30 tablet 5  . midodrine (PROAMATINE) 10 MG tablet Take 1 tablet (10 mg total) by mouth 2 (two) times daily with a meal. (Patient not taking: Reported on 01/14/2015) 60 tablet 0  . Nutritional Supplements (FEEDING SUPPLEMENT, NEPRO CARB STEADY,) LIQD Take 237 mLs by mouth as needed (missed meal during dialysis.). (Patient not taking: Reported on 01/14/2015) 6 Can prn  . saccharomyces boulardii (FLORASTOR) 250 MG capsule Take 1 capsule (250 mg total) by mouth 2 (two) times daily. (Patient not taking: Reported on 01/14/2015) 12 capsule 0     REVIEW OF SYSTEMS: Knee and elbow pain  PHYSICAL EXAMINATION:   Vital signs are BP 106/45 mmHg  Pulse 95  Temp(Src) 97.4 F (36.3 C) (Oral)  Resp 18  Ht  (1.88 m)  Wt 276 lb (125.193 kg)  BMI 35.42 kg/m2  SpO2 94% General: The patient appears  their stated age. HEENT:  No gross abnormalities Pulmonary:  Non labored breathing Musculoskeletal: There are no major deformities. Neurologic: No focal weakness or paresthesias are detected, Skin: There are no ulcer or rashes noted. Psychiatric: The patient has normal affect. Cardiovascular: Palpable thrill in AVF   Assessment: ESRD  Plan: fistulogram with possible PTA.     Jorge Ny, M.D. [facility] Office: [phone] Pager:  [phone]

## 2015-03-29 ENCOUNTER — Other Ambulatory Visit: Payer: Self-pay | Admitting: Internal Medicine

## 2015-05-13 ENCOUNTER — Emergency Department (HOSPITAL_COMMUNITY)
Admission: EM | Admit: 2015-05-13 | Discharge: 2015-06-09 | Disposition: E | Payer: Medicare Other | Attending: Emergency Medicine | Admitting: Emergency Medicine

## 2015-05-13 DIAGNOSIS — N186 End stage renal disease: Secondary | ICD-10-CM | POA: Insufficient documentation

## 2015-05-13 DIAGNOSIS — Z992 Dependence on renal dialysis: Secondary | ICD-10-CM | POA: Diagnosis not present

## 2015-05-13 DIAGNOSIS — R61 Generalized hyperhidrosis: Secondary | ICD-10-CM | POA: Insufficient documentation

## 2015-05-13 DIAGNOSIS — I469 Cardiac arrest, cause unspecified: Secondary | ICD-10-CM | POA: Diagnosis present

## 2015-05-13 MED ORDER — EPINEPHRINE HCL 0.1 MG/ML IJ SOSY
PREFILLED_SYRINGE | INTRAMUSCULAR | Status: AC | PRN
  Administered 2015-05-13: 1 mg via INTRAVENOUS

## 2015-05-13 NOTE — Code Documentation (Signed)
Remains in asystole, No cardiac activity noted.

## 2015-05-13 NOTE — Progress Notes (Signed)
Patient received in ED with a [facility] airway, active CPR, RT placed ETCO2 on King airway. ETCO2 unable to pick up any reading.

## 2015-05-13 NOTE — Progress Notes (Signed)
Responded to CPR in progress . Patient in trauma C bay. Pt came to ED via EMS from home. Per wife  pt was sitting on the couch and family member went outside to the mailbox and came back in and pt was on the floor unresponsive. Patient passed shotly after arriving to ED. I provided active listening, emotional, spiritual and grief support to wife, son, daughter,grandson and other family.  Family want [facility] to have body.  Wife and son will call back patient's primary doctor information  to provided ED number. Supported Haematologist and faciltiated information sharing between staff and family.Remained with family until their departure.    05-22-2015 1300  Clinical Encounter Type  Visited With Patient and family together;Health care provider  Visit Type Initial;Spiritual support;Death;ED;Trauma  Referral From Nurse  Spiritual Encounters  Spiritual Needs Prayer;Emotional;Grief support  Stress Factors  Family Stress Factors Exhausted;Family relationships;Loss  Fae Pippin ,pager [phone]

## 2015-05-13 NOTE — ED Notes (Signed)
Talked with EDP, waiting on a return call from PCP.

## 2015-05-13 NOTE — ED Notes (Signed)
Pt belongings placed in belongings bag and placed with pt.

## 2015-05-13 NOTE — ED Notes (Signed)
Pt to department via EMS from home- pt was sitting on the couch and family member went outside to the mailbox and came back in and pt was on the floor unresponsive. EMS reports cpr was done for about 1 hour PTA. 9 Epis, 1 calcium and 1 bicarb. Pt in asystole then v-tach shocked once back to asystole. Pt is a dialysis pt and had a treatment yesterday. IO to the right tibia. King airway in place.

## 2015-05-13 NOTE — ED Provider Notes (Signed)
CSN: [id number]     Arrival date & time May 28, 2015  1207 History   First MD Initiated Contact with Patient May 28, 2015 1211     Chief Complaint  Patient presents with  . Cardiac Arrest   The history is provided by the EMS personnel.  Pt is a 69 yo male with hx of ESRD on dialysis presenting in cardiac arrest.  Per EMS, pt was sitting on couch with normal behavior when family member walked outside to get mail then walked back in and found pt down.  EMS arrived on scene and pt in PEA.  CPR began and pt intubated and subsequently went into asystole and v-tach.  Shocked and back into asystole.  They gave 9 rounds of epi, 1 calcium, and 1 bicarb in the field with continued asystole.  King air was placed and brought to the ED.   No past medical history on file. No past surgical history on file. No family history on file. History  Substance Use Topics  . Smoking status: Not on file  . Smokeless tobacco: Not on file  . Alcohol Use: Not on file    Review of Systems  Unable to perform ROS: Mental status change   Allergies  Review of patient's allergies indicates not on file.  Home Medications   Prior to Admission medications   Not on File   Wt 300 lb (136.079 kg) Physical Exam  Constitutional: He appears well-developed. He is intubated.  HENT:  Head: Normocephalic and atraumatic.  King airway in place  Eyes: Right pupil is not reactive. Left pupil is not reactive.  Neck: Trachea normal. No tracheal deviation present.  Cardiovascular:  Pulses:      Carotid pulses are 0 on the right side, and 0 on the left side.      Radial pulses are 0 on the right side, and 0 on the left side.       Femoral pulses are 0 on the right side, and 0 on the left side. Pulmonary/Chest: He is intubated. He is in respiratory distress.  Equal breath sounds with frothy bloody sputum at king airway.   Abdominal: Soft. He exhibits distension.  Genitourinary: Penis normal.  Neurological: He is unresponsive. GCS eye  subscore is 1. GCS verbal subscore is 1. GCS motor subscore is 1.  Skin: He is diaphoretic.    ED Course  Procedures  Labs Review Labs Reviewed - No data to display  Imaging Review No results found.   EKG Interpretation None      MDM   Final diagnoses:  Cardiac arrest   Pt is a 68 yo male with known ESRD on dialysis presenting in cardiac arrest asystole s/p 1 hour of CPR, 9 rounds of epi, 1 ca, and 1 bicarb.  Pt with non-reacitive pupils and no pulses with asystole on monitor on arrival  1 dose epi given with 2 minutes of CPR.  Bloody sputum from king aiway but bilateral breath sounds.  Due to continued asystole and prolonged CPR with no return of pulses doubt pt would be able to obtain ROSC with continued efforts.  Repeat check showed asystole on monitor with no pulses.  No pupillary response and likely extreme hypoxia and neurologic damage.  Code called at 1215 with further resuscitative efforts being futile.    Discussed care and delivered news of death to family.  Discussed with family in depth and no further questions.   Pt taken to morgue with death certificate to be filled by medical  examiner and PCP.    If performed, labs, EKGs, and imaging were reviewed/interpreted by myself and my attending and incorporated into medical decision making.  Pt care supervised by my attending Dr. Lenis Noon, MD PGY-2  Emergency Medicine     Tery Sanfilippo, MD June 08, 2015 1610  Geoffery Lyons, MD 05/14/15 [phone]

## 2015-05-13 NOTE — ED Notes (Signed)
Pt transported to the morgue  

## 2015-05-13 NOTE — Code Documentation (Signed)
Patient time of death occurred at 15-Mar-1205.

## 2015-05-14 ENCOUNTER — Telehealth: Payer: Self-pay | Admitting: Family Medicine

## 2015-05-14 NOTE — Telephone Encounter (Signed)
Called back to Dr. Judd Lien. I informed him that I would be able to sign the death certificate based on my experience with the patient and his known significant medical problems.  Will await death certificate from funeral home.

## 2015-05-14 NOTE — Telephone Encounter (Signed)
-----   Message from Suella Grove sent at 05-24-2015  2:24 PM EDT ----- Doctor Emily Filbert called wanting to speak to you regarding patient's death certificate status. Please f/u at [phone]

## 2015-05-15 ENCOUNTER — Telehealth: Payer: Self-pay | Admitting: *Deleted

## 2015-05-15 NOTE — Telephone Encounter (Signed)
Death Certificate fax to [phone]  Notified to [phone]

## 2015-05-21 ENCOUNTER — Ambulatory Visit: Payer: Medicare Other | Admitting: Cardiovascular Disease

## 2015-05-25 MED FILL — Medication: Qty: 1 | Status: AC

## 2015-06-09 DEATH — deceased

## 2016-07-13 IMAGING — CR DG CHEST 2V
2 series · 2 of 2 positions shown · non-contrast
Comparison: 03/05/2014

CLINICAL DATA: Short of breath

EXAM:
CHEST  2 VIEW

[w chest lat]
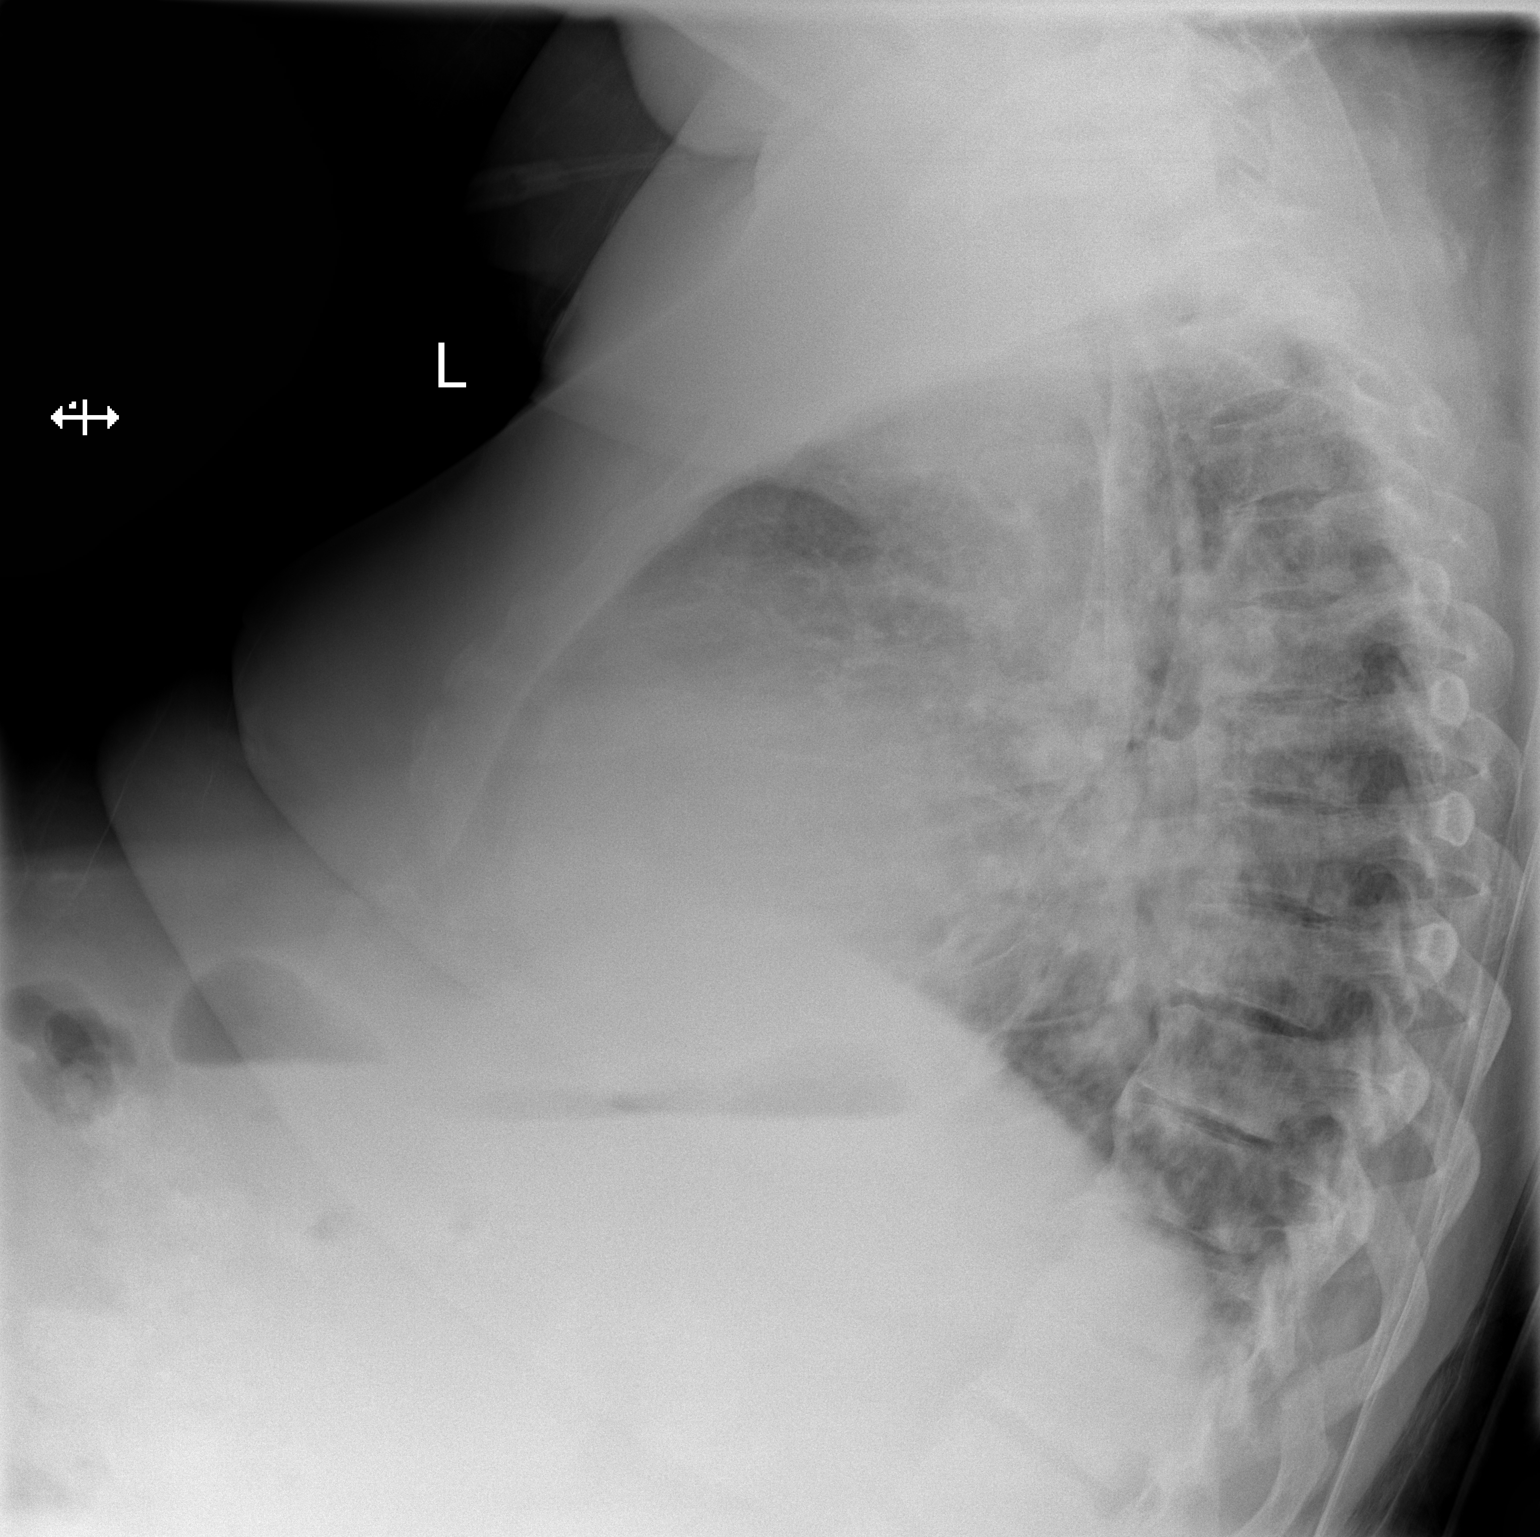

[x chest ap]
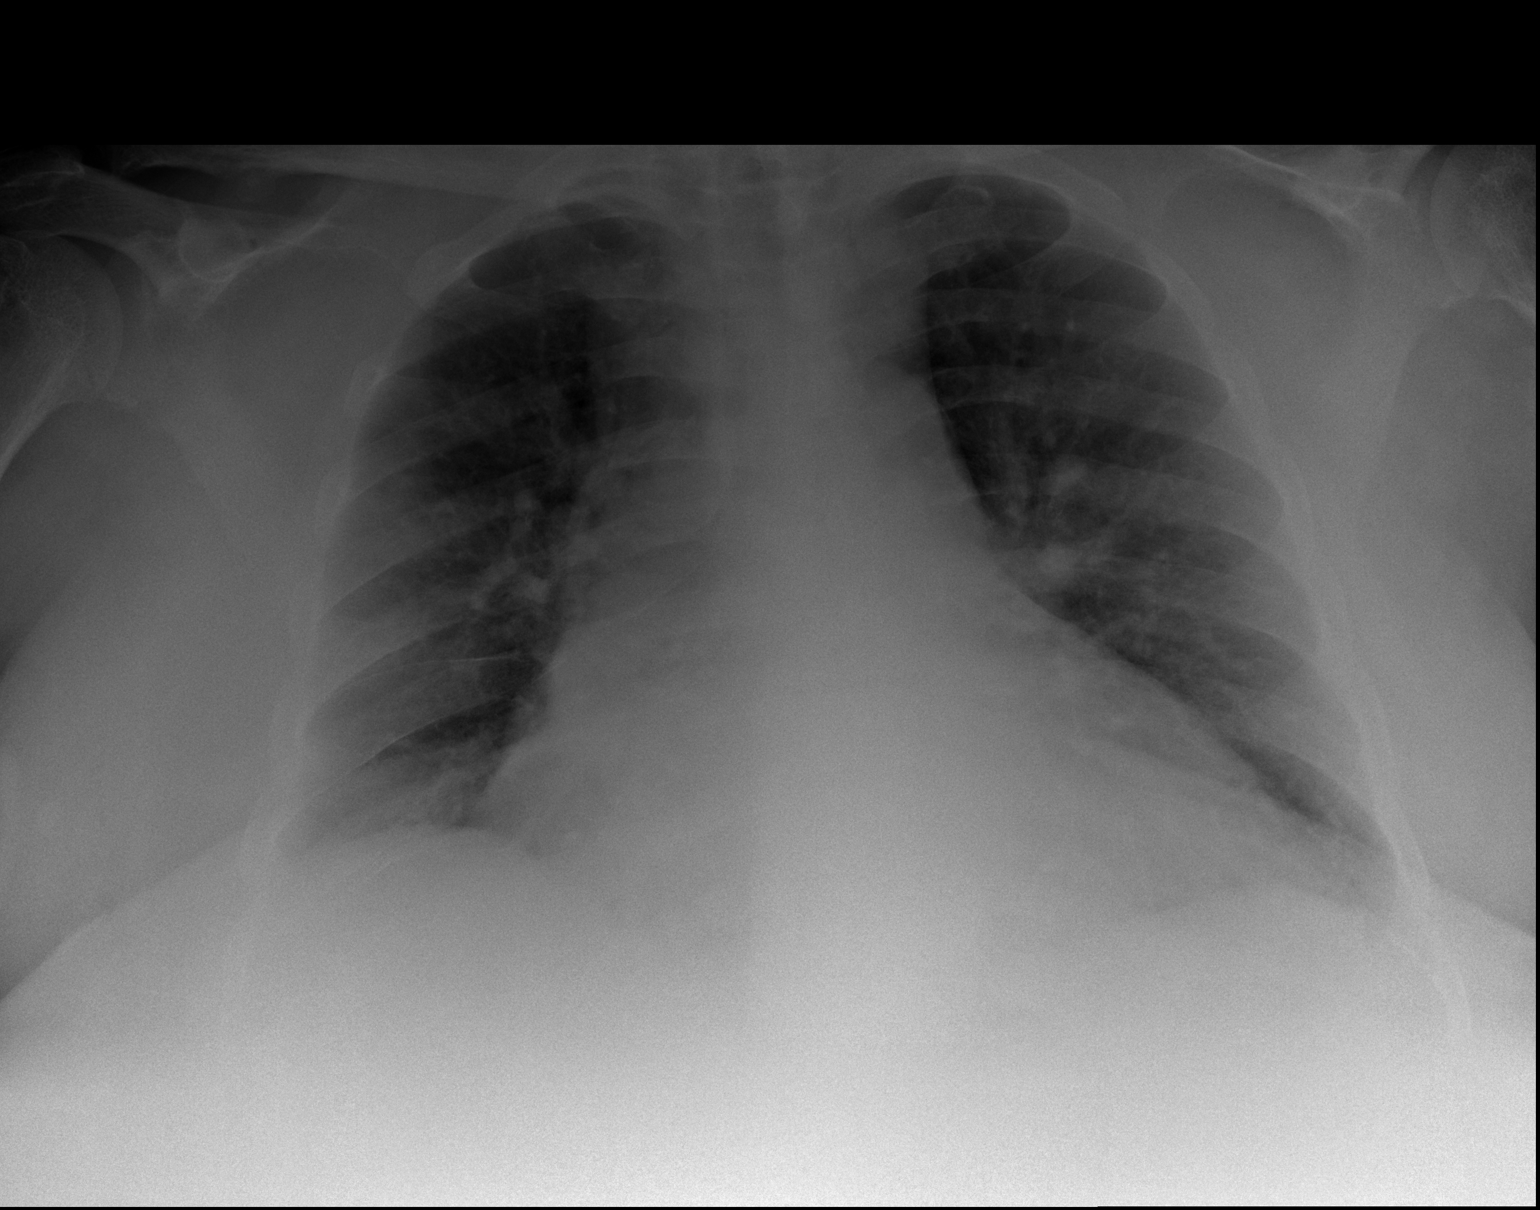

[2 of 2 positions shown; findings below may reference images not displayed]

FINDINGS: Cardiomegaly. Normal vascularity. Low volumes. Bibasilar
atelectasis. No pleural effusion. No pneumothorax. Central vascular
crowding.
IMPRESSION: Cardiomegaly without edema.  Bibasilar atelectasis.
